# Patient Record
Sex: Male | Born: 1955 | Race: White | Hispanic: No | Marital: Single | Smoking: Current every day smoker
Health system: Southern US, Community
[De-identification: ages and names within clinical notes are randomized; demographics above are authoritative.]

## PROBLEM LIST (undated history)

## (undated) DIAGNOSIS — K219 Gastro-esophageal reflux disease without esophagitis: Secondary | ICD-10-CM

## (undated) DIAGNOSIS — R569 Unspecified convulsions: Secondary | ICD-10-CM

## (undated) DIAGNOSIS — C719 Malignant neoplasm of brain, unspecified: Secondary | ICD-10-CM

## (undated) DIAGNOSIS — F419 Anxiety disorder, unspecified: Principal | ICD-10-CM

## (undated) HISTORY — DX: Anxiety disorder, unspecified: F41.9

## (undated) HISTORY — PX: BRAIN TUMOR EXCISION: SHX577

## (undated) HISTORY — DX: Malignant neoplasm of brain, unspecified: C71.9

## (undated) HISTORY — PX: SALIVARY GLAND SURGERY: SHX768

## (undated) HISTORY — DX: Unspecified convulsions: R56.9

## (undated) HISTORY — DX: Gastro-esophageal reflux disease without esophagitis: K21.9

---

## 2008-11-25 ENCOUNTER — Emergency Department (HOSPITAL_COMMUNITY): Admission: EM | Admit: 2008-11-25 | Discharge: 2008-11-25 | Payer: Self-pay | Admitting: Emergency Medicine

## 2009-10-08 ENCOUNTER — Emergency Department (HOSPITAL_COMMUNITY): Admission: EM | Admit: 2009-10-08 | Discharge: 2009-10-08 | Payer: Self-pay | Admitting: Emergency Medicine

## 2010-06-15 ENCOUNTER — Emergency Department (HOSPITAL_COMMUNITY): Admission: EM | Admit: 2010-06-15 | Discharge: 2010-06-15 | Payer: Self-pay | Admitting: Emergency Medicine

## 2010-06-20 ENCOUNTER — Other Ambulatory Visit: Admission: RE | Admit: 2010-06-20 | Discharge: 2010-06-20 | Payer: Self-pay | Admitting: Otolaryngology

## 2011-03-16 LAB — BASIC METABOLIC PANEL
CO2: 22 mEq/L (ref 19–32)
Chloride: 102 mEq/L (ref 96–112)
Creatinine, Ser: 1.2 mg/dL (ref 0.4–1.5)
GFR calc non Af Amer: >60 mL/min (ref >60)
Glucose, Bld: 104 mg/dL — ABNORMAL HIGH (ref 70–99)
Potassium: 3.4 mEq/L — ABNORMAL LOW (ref 3.5–5.1)
Sodium: 132 mEq/L — ABNORMAL LOW (ref 135–145)

## 2011-03-16 LAB — DIFFERENTIAL
Basophils Relative: 1 % (ref 0–1)
Eosinophils Absolute: 0.5 10*3/uL (ref 0.0–0.7)
Lymphs Abs: 2.6 10*3/uL (ref 0.7–4.0)
Monocytes Absolute: 0.6 10*3/uL (ref 0.1–1.0)
Monocytes Relative: 5 % (ref 3–12)
Neutrophils Relative %: 71 % (ref 43–77)

## 2011-03-16 LAB — CBC
HCT: 41.1 % (ref 39.0–52.0)
Hemoglobin: 14.2 g/dL (ref 13.0–17.0)
MCV: 88.2 fL (ref 78.0–100.0)
Platelets: 262 10*3/uL (ref 150–400)
WBC: 13 10*3/uL — ABNORMAL HIGH (ref 4.0–10.5)

## 2012-09-07 ENCOUNTER — Encounter (HOSPITAL_COMMUNITY): Payer: Self-pay | Admitting: *Deleted

## 2012-09-07 ENCOUNTER — Emergency Department (HOSPITAL_COMMUNITY)
Admission: EM | Admit: 2012-09-07 | Discharge: 2012-09-07 | Disposition: A | Discharge status: Home / Self Care | Payer: Self-pay | Attending: Emergency Medicine | Admitting: Emergency Medicine

## 2012-09-07 DIAGNOSIS — F17200 Nicotine dependence, unspecified, uncomplicated: Secondary | ICD-10-CM | POA: Insufficient documentation

## 2012-09-07 DIAGNOSIS — L089 Local infection of the skin and subcutaneous tissue, unspecified: Secondary | ICD-10-CM | POA: Insufficient documentation

## 2012-09-07 MED ORDER — DOXYCYCLINE HYCLATE 100 MG PO TABS
ORAL_TABLET | ORAL | Status: AC
  Administered 2012-09-07: 100 mg via ORAL
  Filled 2012-09-07: qty 1

## 2012-09-07 MED ORDER — DOXYCYCLINE HYCLATE 100 MG PO CAPS: 100.0000 mg | ORAL_CAPSULE | Freq: Two times a day (BID) | ORAL | Status: AC

## 2012-09-07 MED ORDER — DOXYCYCLINE HYCLATE 100 MG PO TABS
100.0000 mg | ORAL_TABLET | Freq: Once | ORAL | Status: AC
  Administered 2012-09-07: 100 mg via ORAL

## 2012-09-07 MED ORDER — BACITRACIN ZINC 500 UNIT/GM EX OINT
TOPICAL_OINTMENT | CUTANEOUS | Status: AC
  Filled 2012-09-07: qty 0.9

## 2012-09-07 MED ORDER — HYDROCODONE-ACETAMINOPHEN 5-325 MG PO TABS: 1.0000 | ORAL_TABLET | Freq: Four times a day (QID) | ORAL | Status: AC | PRN

## 2012-09-07 MED ORDER — BACITRACIN ZINC 500 UNIT/GM EX OINT
TOPICAL_OINTMENT | Freq: Once | CUTANEOUS | Status: AC
  Administered 2012-09-07: 14:00:00 via TOPICAL

## 2012-09-07 NOTE — ED Notes (Signed)
Pt states that he thinks that he may have been bitten by a spider on Friday, at first there was a blister, blister busted and now he continues to have pain. Pt has open area on third toe and blister noted behind great toe on left foot.

## 2012-09-07 NOTE — ED Provider Notes (Signed)
History     CSN: [id number]  Arrival date & time 09/07/12  1229   First MD Initiated Contact with Patient 09/07/12 1315      Chief Complaint  Patient presents with  . Insect Bite    (Consider location/radiation/quality/duration/timing/severity/associated sxs/prior treatment) HPI Comments: Pt leaves a pair of shoes outside and thinks that when he slipped them on a couple days ago a spider or insect bit him on the L foot.  Has become increasingly painful and lesion began draining this AM.  The history is provided by the patient. No language interpreter was used.    History reviewed. No pertinent past medical history.  Past Surgical History  Procedure Date  . Salivary gland surgery     No family history on file.  History  Substance Use Topics  . Smoking status: Current Everyday Smoker  . Smokeless tobacco: Not on file  . Alcohol Use: No      Review of Systems  Constitutional: Negative for fever and chills.  Skin: Positive for wound.  All other systems reviewed and are negative.    Allergies  Review of patient's allergies indicates no known allergies.  Home Medications   Current Outpatient Rx  Name Route Sig Dispense Refill  . IBUPROFEN 200 MG PO TABS Oral Take 400 mg by mouth every 6 (six) hours as needed. Pain    . ADULT MULTIVITAMIN W/MINERALS CH Oral Take 1 tablet by mouth daily.    Marland Kitchen DOXYCYCLINE HYCLATE 100 MG PO CAPS Oral Take 1 capsule (100 mg total) by mouth 2 (two) times daily. 20 capsule 0  . HYDROCODONE-ACETAMINOPHEN 5-325 MG PO TABS Oral Take 1 tablet by mouth every 6 (six) hours as needed for pain. 20 tablet 0    BP 146/76  Pulse 87  Temp 98 F (36.7 C) (Oral)  Resp 20  SpO2 98%  Physical Exam  Nursing note and vitals reviewed. Constitutional: He is oriented to person, place, and time. He appears well-developed and well-nourished.  HENT:  Head: Normocephalic and atraumatic.  Eyes: EOM are normal.  Neck: Normal range of motion.    Cardiovascular: Normal rate, regular rhythm, normal heart sounds and intact distal pulses.   Pulmonary/Chest: Effort normal and breath sounds normal. No respiratory distress.  Abdominal: Soft. He exhibits no distension. There is no tenderness.  Musculoskeletal:       Left foot: He exhibits tenderness. He exhibits no bony tenderness, no swelling and normal capillary refill.       1 cm diam pustule draining at plantar junction of L 1st and 2nd toes.  Abrasion like sore on medial side of 4th toe.  Neurological: He is alert and oriented to person, place, and time.  Skin: Skin is warm and dry.  Psychiatric: He has a normal mood and affect. Judgment normal.    ED Course  Procedures (including critical care time)  Labs Reviewed - No data to display No results found.   1. Pustule       MDM  rx-doxycycline, 20 rx-hydrocodone, 20 Ibuprofen Warm soaks.   Return prn        Evalina Field, [place] 09/07/12 1421

## 2012-09-07 NOTE — ED Notes (Signed)
Pt with small blister noted on Friday per pt, blister bigger between left great toe and second toe, noted with drainage, also second blister between second and third toe of same foot, pt thinks he may have gotten bit by a spider

## 2012-09-10 NOTE — ED Provider Notes (Signed)
Medical screening examination/treatment/procedure(s) were performed by non-physician practitioner and as supervising physician I was immediately available for consultation/collaboration.  Donnetta Hutching, MD 09/10/12 [phone]

## 2013-10-21 ENCOUNTER — Other Ambulatory Visit (HOSPITAL_COMMUNITY): Payer: Self-pay | Admitting: Family Medicine

## 2013-10-21 ENCOUNTER — Ambulatory Visit (HOSPITAL_COMMUNITY)
Admission: RE | Admit: 2013-10-21 | Discharge: 2013-10-21 | Disposition: A | Discharge status: Home / Self Care | Payer: 59 | Source: Ambulatory Visit | Attending: Family Medicine | Admitting: Family Medicine

## 2013-10-21 DIAGNOSIS — F17200 Nicotine dependence, unspecified, uncomplicated: Secondary | ICD-10-CM | POA: Insufficient documentation

## 2013-10-21 DIAGNOSIS — R918 Other nonspecific abnormal finding of lung field: Secondary | ICD-10-CM | POA: Insufficient documentation

## 2014-04-13 DIAGNOSIS — J449 Chronic obstructive pulmonary disease, unspecified: Secondary | ICD-10-CM | POA: Insufficient documentation

## 2014-04-13 DIAGNOSIS — C719 Malignant neoplasm of brain, unspecified: Secondary | ICD-10-CM | POA: Insufficient documentation

## 2014-04-13 DIAGNOSIS — G40909 Epilepsy, unspecified, not intractable, without status epilepticus: Secondary | ICD-10-CM | POA: Insufficient documentation

## 2014-04-13 MED ORDER — TEMOZOLOMIDE 20 MG PO CAPS: 20.0000 mg | ORAL_CAPSULE | Freq: Every day | ORAL | Status: DC

## 2014-04-13 MED ORDER — TEMOZOLOMIDE 140 MG PO CAPS: 140.0000 mg | ORAL_CAPSULE | Freq: Every day | ORAL | Status: DC

## 2014-04-14 ENCOUNTER — Other Ambulatory Visit (HOSPITAL_COMMUNITY): Payer: Self-pay | Admitting: Hematology and Oncology

## 2014-04-14 DIAGNOSIS — C719 Malignant neoplasm of brain, unspecified: Secondary | ICD-10-CM

## 2014-04-19 ENCOUNTER — Ambulatory Visit (HOSPITAL_COMMUNITY)
Admission: RE | Admit: 2014-04-19 | Discharge: 2014-04-19 | Disposition: A | Discharge status: Home / Self Care | Payer: 59 | Source: Ambulatory Visit | Attending: Hematology and Oncology | Admitting: Hematology and Oncology

## 2014-04-19 ENCOUNTER — Telehealth (HOSPITAL_COMMUNITY): Payer: Self-pay | Admitting: *Deleted

## 2014-04-19 DIAGNOSIS — C719 Malignant neoplasm of brain, unspecified: Secondary | ICD-10-CM | POA: Insufficient documentation

## 2014-04-19 DIAGNOSIS — I6789 Other cerebrovascular disease: Secondary | ICD-10-CM | POA: Insufficient documentation

## 2014-04-19 DIAGNOSIS — Z9889 Other specified postprocedural states: Secondary | ICD-10-CM | POA: Insufficient documentation

## 2014-04-19 MED ORDER — GADOBENATE DIMEGLUMINE 529 MG/ML IV SOLN
20.0000 mL | Freq: Once | INTRAVENOUS | Status: AC | PRN
  Administered 2014-04-19: 20 mL via INTRAVENOUS

## 2014-04-21 ENCOUNTER — Encounter (HOSPITAL_COMMUNITY): Payer: 59 | Attending: Hematology and Oncology

## 2014-04-21 ENCOUNTER — Encounter (HOSPITAL_COMMUNITY): Payer: Self-pay

## 2014-04-21 VITALS — BP 131/79 | HR 100 | Temp 97.3°F | Resp 18 | Ht 69.0 in | Wt 214.0 lb

## 2014-04-21 DIAGNOSIS — K219 Gastro-esophageal reflux disease without esophagitis: Secondary | ICD-10-CM | POA: Insufficient documentation

## 2014-04-21 DIAGNOSIS — C711 Malignant neoplasm of frontal lobe: Secondary | ICD-10-CM | POA: Insufficient documentation

## 2014-04-21 DIAGNOSIS — C719 Malignant neoplasm of brain, unspecified: Secondary | ICD-10-CM

## 2014-04-21 DIAGNOSIS — G40909 Epilepsy, unspecified, not intractable, without status epilepticus: Secondary | ICD-10-CM | POA: Insufficient documentation

## 2014-04-21 DIAGNOSIS — J4489 Other specified chronic obstructive pulmonary disease: Secondary | ICD-10-CM | POA: Insufficient documentation

## 2014-04-21 DIAGNOSIS — J309 Allergic rhinitis, unspecified: Secondary | ICD-10-CM | POA: Insufficient documentation

## 2014-04-21 DIAGNOSIS — J449 Chronic obstructive pulmonary disease, unspecified: Secondary | ICD-10-CM

## 2014-04-21 LAB — CBC WITH DIFFERENTIAL/PLATELET
BASOS PCT: 0 % (ref 0–1)
Basophils Absolute: 0 10*3/uL (ref 0.0–0.1)
EOS PCT: 2 % (ref 0–5)
Eosinophils Absolute: 0.2 10*3/uL (ref 0.0–0.7)
HEMATOCRIT: 38.2 % — AB (ref 39.0–52.0)
HEMOGLOBIN: 13.4 g/dL (ref 13.0–17.0)
LYMPHS PCT: 25 % (ref 12–46)
Lymphs Abs: 3 10*3/uL (ref 0.7–4.0)
MCH: 30.1 pg (ref 26.0–34.0)
MCHC: 35.1 g/dL (ref 30.0–36.0)
MCV: 85.8 fL (ref 78.0–100.0)
MONO ABS: 0.9 10*3/uL (ref 0.1–1.0)
MONOS PCT: 8 % (ref 3–12)
NEUTROS ABS: 7.8 10*3/uL — AB (ref 1.7–7.7)
Neutrophils Relative %: 65 % (ref 43–77)
Platelets: 338 10*3/uL (ref 150–400)
RBC: 4.45 MIL/uL (ref 4.22–5.81)
RDW: 14 % (ref 11.5–15.5)
WBC: 11.8 10*3/uL — AB (ref 4.0–10.5)

## 2014-04-21 LAB — COMPREHENSIVE METABOLIC PANEL
ALT: 27 U/L (ref 0–53)
AST: 20 U/L (ref 0–37)
Albumin: 3.4 g/dL — ABNORMAL LOW (ref 3.5–5.2)
Alkaline Phosphatase: 88 U/L (ref 39–117)
BUN: 19 mg/dL (ref 6–23)
CALCIUM: 9.8 mg/dL (ref 8.4–10.5)
CHLORIDE: 97 meq/L (ref 96–112)
CO2: 23 meq/L (ref 19–32)
Creatinine, Ser: 1.16 mg/dL (ref 0.50–1.35)
GFR calc non Af Amer: 68 mL/min — ABNORMAL LOW (ref >90)
GFR, EST AFRICAN AMERICAN: 79 mL/min — AB (ref >90)
Glucose, Bld: 93 mg/dL (ref 70–99)
POTASSIUM: 3.5 meq/L — AB (ref 3.7–5.3)
Sodium: 135 mEq/L — ABNORMAL LOW (ref 137–147)
TOTAL PROTEIN: 7.2 g/dL (ref 6.0–8.3)
Total Bilirubin: 0.2 mg/dL — ABNORMAL LOW (ref 0.3–1.2)

## 2014-04-21 LAB — LACTATE DEHYDROGENASE: LDH: 217 U/L (ref 94–250)

## 2014-04-21 MED ORDER — OXYCODONE HCL 5 MG PO CAPS: ORAL_CAPSULE | ORAL | Status: DC

## 2014-04-21 MED ORDER — ONDANSETRON 8 MG PO TBDP: ORAL_TABLET | ORAL | Status: DC

## 2014-04-21 NOTE — Progress Notes (Signed)
Cody Buchanan presented for labwork. Labs per MD order drawn via Peripheral Line 23 gauge needle inserted in left AC  Good blood return present. Procedure without incident.  Needle removed intact. Patient tolerated procedure well.   

## 2014-04-21 NOTE — Progress Notes (Signed)
[place] A. Barnet Glasgow, M.D.  NEW PATIENT EVALUATION   Name: Cody Buchanan Date: 04/21/2014 MRN: [id number] DOB: 1956/09/23  PCP: [name], MD   REFERRING PHYSICIAN: [name], MD  REASON FOR REFERRAL: Glioblastoma multiforme, status post surgical resection     HISTORY OF PRESENT ILLNESS:Cody Buchanan is a 58 y.o. 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 Apparently he has been enrolled in the REGULATEe trial at [facility] and is here today to establish himself for combined modality therapy given closer to his home. He presented on 02/17/2014 with a seizure occurring at 4:30 AM while on a trip accompanied by a colleague in a hotel near [place]. He had  sutures removed this morning by family physician. He's had occasional headache but no double vision and slightly blurred vision. He has noticed some decrease in short-term memory. He denies any focal weakness. He's had no seizure activity. Appetite is good with no nausea, vomiting, diarrhea, constipation, melena, hematochezia, hematuria, or incontinence. He denies any lower extremity swelling or redness, PND, orthopnea, palpitations, or worsening cough or shortness of breath.   PAST MEDICAL HISTORY:  has a past medical history of Glioblastoma; GERD (gastroesophageal reflux disease); and Seizures.   [facility] Office Note of 04/12/2014: Cody Buchanan presents to clinic today to discuss treatment options for his Glioblastoma. He had an excellent gross total resection and is ready to proceed with additional treatment. We discussed treatment options with him today and he would like to move forward with enrolling in the REGULATory T-Cell Inhibition  with Basiliximab (Simulect) during Recovery from Therapeutic Temozolomide-induced Lymphopenia during Anti-tumor Immunotherapy Targeted Against Cytomegalovirus in Patients with Newly Diagnosed Glioblastoma Multiforme (REGULATEe) clinical trial. The study design includes the following:   Leukapheresis collection with portion of cells, along with addition GM-CSF used to manufacture DC CMV pp65 Vaccine  Standard of Care Radiation with Temolozomide for 6 weeks, with Post-Radiation Temozolomide  Vaccines administered on Day 21 of cycle, every 2 weeks, for the first 6 weeks of study (for the first 3 Vaccines)  Basiliximab will be administered one week before vaccine dose one and two  Vaccines continue with monthly cycles of Temolozomide for a total of 8 Vaccines, and total 12 cycles of Temolozomide (until intolerance or tumor progression)    PAST SURGICAL HISTORY: Past Surgical History  Procedure Laterality Date  . Salivary gland surgery    . Brain tumor excision       CURRENT MEDICATIONS: has a current medication list which includes the following prescription(s): acetaminophen, albuterol, clindamycin, ibuprofen, levetiracetam, multivitamin with minerals, oxycodone, tiotropium, ondansetron, ondansetron, temozolomide, and temozolomide.   ALLERGIES: Review of patient's allergies indicates no known allergies.   SOCIAL HISTORY:  reports that he has been smoking Cigarettes.  He has been smoking about 0.00 packs per day. He does not have any smokeless tobacco history on file. He reports that he does not drink alcohol or use illicit drugs.   FAMILY HISTORY: family history includes Cancer in his mother; Diabetes in his father and mother; Hypertension in his mother; Stroke in his mother.    REVIEW OF SYSTEMS:  Other than that discussed above is noncontributory.    PHYSICAL EXAM:  height is _0  (1.753 m) and weight  is 214 lb (97.07 kg). His oral temperature is 97.3 F (36.3 C). His blood  pressure is 131/79 and his pulse is 100. His respiration is 18.    GENERAL:alert, no distress and comfortable. Slightly cushingoid with a  bull neck. SKIN: skin color, texture, turgor are normal, no rashes or significant lesions. Frontal scalp V-shaped incision is healing well extending along the left lateral skull. EYES: normal, Conjunctiva are pink and non-injected, sclera clear OROPHARYNX:no exudate, no erythema and lips, buccal mucosa, and tongue normal  NECK: supple, thyroid normal size, non-tender, without nodularity CHEST: Increased AP diameter with no gynecomastia. LYMPH:  no palpable lymphadenopathy in the cervical, axillary or inguinal LUNGS: clear to auscultation and percussion with normal breathing effort HEART: regular rate & rhythm and no murmurs ABDOMEN:abdomen soft, non-tender and normal bowel sounds MUSCULOSKELETALl:no cyanosis of digits, no clubbing or edema  NEURO: alert & oriented x 3 with fluent speech, no focal motor/sensory deficits    LABORATORY DATA:    04/06/2014: WBC 10.3 hemoglobin 13.2 platelets 279,000.  No visits with results within 30 Day(s) from this visit. Latest known visit with results is:  Hospital Outpatient Visit on 06/15/2010  Component Date Value Ref Range Status  . Sodium 06/15/2010 132* 135 - 145 mEq/L Final  . Potassium 06/15/2010 3.4* 3.5 - 5.1 mEq/L Final  . Chloride 06/15/2010 102  96 - 112 mEq/L Final  . CO2 06/15/2010 22  19 - 32 mEq/L Final  . Glucose, Bld 06/15/2010 104* 70 - 99 mg/dL Final  . BUN 06/15/2010 20  6 - 23 mg/dL Final  . Creatinine, Ser 06/15/2010 1.20  0.4 - 1.5 mg/dL Final  . Calcium 06/15/2010 8.6  8.4 - 10.5 mg/dL Final  . GFR calc non Af Amer 06/15/2010 >60  >60 mL/min Final  . GFR calc Af Amer 06/15/2010   >60 mL/min Final                   Value:>60                                The eGFR has been calculated                         using the MDRD equation.                         This calculation has not been                           validated in all clinical                         situations.                         eGFR's persistently                         <60 mL/min signify                         possible Chronic Kidney Disease.  . WBC 06/15/2010 13.0* 4.0 - 10.5 K/uL Final  . RBC 06/15/2010 4.66  4.22 - 5.81 MIL/uL Final  . Hemoglobin 06/15/2010 14.2  13.0 - 17.0 g/dL Final  .  HCT 06/15/2010 41.1  39.0 - 52.0 % Final  . MCV 06/15/2010 88.2  78.0 - 100.0 fL Final  . MCHC 06/15/2010 34.5  30.0 - 36.0 g/dL Final  . RDW 06/15/2010 12.7  11.5 - 15.5 % Final  . Platelets 06/15/2010 262  150 - 400 K/uL Final  . Neutrophils Relative % 06/15/2010 71  43 - 77 % Final  . Neutro Abs 06/15/2010 9.2* 1.7 - 7.7 K/uL Final  . Lymphocytes Relative 06/15/2010 20  12 - 46 % Final  . Lymphs Abs 06/15/2010 2.6  0.7 - 4.0 K/uL Final  . Monocytes Relative 06/15/2010 5  3 - 12 % Final  . Monocytes Absolute 06/15/2010 0.6  0.1 - 1.0 K/uL Final  . Eosinophils Relative 06/15/2010 4  0 - 5 % Final  . Eosinophils Absolute 06/15/2010 0.5  0.0 - 0.7 K/uL Final  . Basophils Relative 06/15/2010 1  0 - 1 % Final  . Basophils Absolute 06/15/2010 0.1  0.0 - 0.1 K/uL Final    Urinalysis No results found for this basename: colorurine,  appearanceur,  labspec,  phurine,  glucoseu,  hgbur,  bilirubinur,  ketonesur,  proteinur,  urobilinogen,  nitrite,  leukocytesur      _0 : Mr Kizzie Fantasia Contrast  04/19/2014   CLINICAL DATA:  Glioblastoma status post resection in 03/03/2014 and 04/06/2014. No chemoradiation yet received.  EXAM: MRI HEAD WITHOUT AND WITH CONTRAST  TECHNIQUE: Multiplanar, multiecho pulse sequences of the brain and surrounding structures were obtained without and with intravenous contrast.  CONTRAST:  23m MULTIHANCE GADOBENATE DIMEGLUMINE 529 MG/ML IV SOLN  COMPARISON:  None.  FINDINGS: Sequelae of left frontal craniotomy and tumor resection are identified. Foci of susceptibility artifact in the  anteromedial left frontal lobe are compatible with a small amount of blood products related to surgery. There is a small amount of extra-axial fluid at this location, also compatible with recent surgery. Small areas of intrinsic T1 hyperintensity in this region also likely reflect blood products. There is relatively smooth enhancement along the margins of the left frontal resection cavity measuring approximately 3 mm in thickness. No masslike enhancement is seen, although there is a small amount of somewhat ill defined enhancement extending slightly beyond the margins of the resection cavity laterally (series 13, image 15 and series 12, image 26). There is cortical and subcortical nonenhancing T2 hyperintensity in the medial left frontal lobe adjacent to the resection site which extends posteriorly into the region of the left cingulate gyrus and left genu of the corpus callosum. Some of this T2 signal abnormality demonstrates mildly restricted diffusion.  No abnormal enhancement is identified elsewhere in the brain. There are multiple small, scattered foci of T2 hyperintensity within the subcortical and deep cerebral white matter bilaterally and pons, nonspecific but compatible with mild chronic small vessel ischemic disease. There is mild generalized cerebral atrophy. There is no midline shift. Orbits are unremarkable. Mild bilateral ethmoid air cell, bilateral maxillary sinus, and left sphenoid sinus mucosal thickening is noted. Mastoid air cells are clear. Major intracranial vascular flow voids are preserved.  IMPRESSION: 1. Postoperative changes from left frontal resection. Enhancement at the margins of the resection site is likely postoperative. T2 signal abnormality extending posteriorly from the resection site may reflect residual nonenhancing tumor. 2. Mild chronic small vessel ischemic disease.   Electronically Signed   By: ALogan Bores  On: 04/19/2014 11:33    PATHOLOGY:  A. "BRAIN, LEFT FRONTAL"  OUTSIDE SLIDE REVIEW, [phone]  [place]. 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  I certify that I personally conducted the diagnostic evaluation of the above specimen(s) and have rendered the above diagnosis(es).   Electronically signed by Jeris Penta, MD on 03/24/2014 at 51  Clinical Information  58 y/o male presented with new onset of seizures. CT showed a 2 cm left frontal lobe mass.   Gross Examination  Outside case A: [id number] Date of surgery: 03/03/14 Number of slides: 6 [place] report signed by: Dr. Isac Caddy  Received from:  [facility] Department of Pathology [facility] [address] [place], [place] [address] Tel: [phone] Fax: [phone]  Accompanying letter addressed to Dr. Tommi Rumps. Material to be returned.   Microscopic Examination  Microscopic examination is performed. 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   MOLECULAR BIOLOGY:  IDH1 Targeted Mutation Analysis with Reflex to [facility] Mutation Analysis4/09/2014  [facility]  Component Name Value Range  Case Report Molecular Diagnostics                             Case: [id number]                                Authorizing Provider:  Baruch Goldmann, MD  Collected:           04/07/2014 [place]             Pathologist:           Constance Haw, MD   Received:            04/12/2014 1706             Specimen:    Tissue                                                                                  Interpretation Unstained slides, [id number] (IDH1 Targeted Mutation Analysis with Reflex to [facility] Mutation Analysis):  Negative.   IDH1 and IDH2 mutations not detected.  See comment and objective findings.   Comment:  Isocitrate dehydrogenase 1 and 2 (IDH1 and IDH2) are NADP+ dependent enzymes that catalyze the  conversion of isocitrate to alpha-ketoglutarate and are key components in the mitochondrial citric acid cycle. Acquired point mutations in primary CNS neoplasms have been described in codon 132 of IDH1 (predominantly Arg132His, but also Arg132Cys, Arg132Ser, Arg132Leu and Arg132Gly) and the analogous amino acid in IDH2 (Arg172Gly, Arg172Lys and Arg172Met). A high percentage of WHO grade II and grade III astrocytic and oligodendroglial neoplasms contain IDH1 or IDH2 mutations, including diffuse astrocytomas (II), oligodendrogliomas (II), anaplastic astrocytomas and oligodendrogliomas (III) and anaplastic oligoastrocytomas (III). IDH1 and IDH2 mutations are also common in secondary glioblastomas (IV) but are rarely found in primary adult or pediatric glioblastomas (IV). In patients with glioblastomas or anaplastic astrocytomas, the presence of an acquired IDH1 or IDH2 mutation is associated with longer overall survival. Multiple factors contribute to prognosis in patients with primary CNS neoplasms. Thus, this assay is intended for use as  an aid in developing patient-specific prognostic predictions but is not a substitute for a complete pathologic and clinical evaluation, or physician's judgment and clinical experience.  The sensitivity and specificity of DNA sequencing is high for the detection of nucleotide base changes, small deletions, and insertions in the regions analyzed. This assay may not detect an acquired mutation that is present below the 15% detection limit (i.e., mutant cell population of <30%). Only amino acids 69-138 of the IDH1 gene and amino acids 126-178 of the IDH2 gene were examined. Changes outside of this region will not be detected. The presence of a mutant population containing a large deletion, duplication, insertion, aberrant splicing, or sequence alteration adversely affecting primer binding may not be identified using these methods. Mutations or polymorphisms in the DNA oligonucleotide  primer binding regions, poor DNA quality, insufficient DNA quantity or the presence of PCR inhibitors can result in uninterpretable or (rarely) inaccurate results. For additional information or for help interpreting the results of this test, clinicians should contact the [facility]. Patients should contact their healthcare provider with any questions related to this report.  References:  Balss J, et al. Analysis of the [facility] codon 132 mutation in brain tumors. Acta Neuropathol. 7096;283:662-947.   Mady Haagensen, et al. [facility] and IDH2 mutations in gliomas. N Engl J Med. 2009;360(8):765-73.  Laboratory Director:                     Marcelina Morel, Ph.D., ABMG; [facility]                      Associate Director, Molecular Diagnostics   Clinical History Glioblastoma (WHO Grade IV)   Sample Type Unstained slides, [id number]   Test Performed [facility] Targeted Mutation Analysis with Reflex to [facility] Mutation Analysis   Objective Findings Complete coverage of IDH1 exon 4 (amino acids 69-138) was obtained using forward and reverse sequencing primers. These sequences were compared to the reference DNA sequence (GenBank Accession: [id number]). Complete coverage of IDH2 exon 4 (amino acids 126-178) was obtained using forward and reverse sequencing primers. These sequences were compared to the reference DNA sequence (GenBank Accession: [id number]).  No mutation was detected.   Methodology This assay uses PCR amplification followed by Sanger DNA sequencing to detect point mutations in exon 4 of the IDH1 gene, with reflex testing to detect point mutations in exon 4 of the IDH2 gene for all IDH1 negative cases. An H&E stained slide for each case is first evaluated to identify the regions of greatest tumor content. These regions are then macro-dissected from adjacent unstained formalin-fixed paraffin-embedded sections and used to prepare genomic DNA. The protein coding and flanking intronic  sequences of IDH1 exon 4 (containing codon 132), and, if reflex testing is performed, IDH2 exon 4 (containing codon 172) are amplified from this purified genomic DNA by PCR. The primers used in these PCR reactions contain M13 universal primer "tails" at their 5' ends, and have 3' ends that are complementary to their genomic target sequence. The resulting PCR products are treated with an exonuclease/phosphatase mixture (ExoSAP-IT) to remove excess PCR primers and nucleotides. These  purified DNA amplicons are then sequenced using universal M13 forward and reverse sequencing primers (M13 Forward/-20 and M13 Reverse/-27) and the [employer] D[employer] Terminator v3.1 Cycle Sequencing Kit [employer]). The products of the completed sequencing reactions are purified with the Big Dye XTerminator Purification Kit and resolved using the ABI Genetic Analyzer. Data is analyzed using the ABI  Data [employer], Librarian, academic, and [employer]. Sequences are compared to the reference DNA sequence for the [facility] and IDH2 genes (GenBank Accession IDH1: [id number]; IDH2: [id number]).   Disclaimer This result contains rich text formatting which cannot be displayed here.      IMPRESSION:  #1. Glioblastoma multiforme left frontal lobe, status post resection with MRI evidence of residual disease. #2. Enrolled in the REGULATEe trial which involves leukapheresis prior to the initiation of combined modality postop therapy with Temodar plus Radiation. #3. Chronic obstructive pulmonary disease, stable. #4. Seizure disorder, controlled. #5. Allergic rhinitis, not symptomatic.   PLAN:  #1. Radiotherapy consultation at [facility] in [place],  [place] on 04/24/2014. #2. Combined modality therapy may not begin until leukapheresis is performed on 05/08/2014 at [facility] according to [facility] protocol. #3. Followup in this office in one month. Rx given for Zofran ODT 8 mg every 8 hours along  with oxycodone as needed. Temodar will be delivered via specialty pharmacy by mail with the patient taking 160 mg per day for 42 consecutive days beginning on day 1 of radiotherapy.   Farrel Gobble, MD 04/21/2014 3:45 PM   DISCLAIMER:  This note was dictated with voice recognition softwre.  Similar sounding words can inadvertently be transcribed inaccurately and may not be corrected upon review.

## 2014-04-21 NOTE — Patient Instructions (Signed)
[facility] Discharge Instructions  RECOMMENDATIONS MADE BY THE CONSULTANT AND ANY TEST RESULTS WILL BE SENT TO YOUR REFERRING PHYSICIAN.  EXAM FINDINGS BY THE PHYSICIAN TODAY AND SIGNS OR SYMPTOMS TO REPORT TO CLINIC OR PRIMARY PHYSICIAN: Exam and findings as discussed by Dr. Barnet Glasgow.  MEDICATIONS PRESCRIBED:   Oxycodone; Zofran; Temodar  INSTRUCTIONS/FOLLOW-UP:  Appointment with radiation oncologist Monday, April 27th as scheduled.  It appears that any treatment you may receives should be held until your leukophoresis is complete for the clinical trial you are involved with at [facility] Please return here in 4 weeks for a follow-up visit with the doctor and blood work (CBC).   Thank you for choosing [facility] to provide your oncology and hematology care.  To afford each patient quality time with our providers, please arrive at least 15 minutes before your scheduled appointment time.  With your help, our goal is to use those 15 minutes to complete the necessary work-up to ensure our physicians have the information they need to help with your evaluation and healthcare recommendations.    Effective January 1st, 2014, we ask that you re-schedule your appointment with our physicians should you arrive 10 or more minutes late for your appointment.  We strive to give you quality time with our providers, and arriving late affects you and other patients whose appointments are after yours.    Again, thank you for choosing [facility].  Our hope is that these requests will decrease the amount of time that you wait before being seen by our physicians.       _____________________________________________________________  Should you have questions after your visit to [facility], please contact our office at [phone] between the hours of 8:30 a.m. and 5:00 p.m.  Voicemails left after 4:30 p.m. will not be returned until the following business  day.  For prescription refill requests, have your pharmacy contact our office with your prescription refill request.

## 2014-05-05 ENCOUNTER — Telehealth (HOSPITAL_COMMUNITY): Payer: Self-pay

## 2014-05-05 NOTE — Telephone Encounter (Signed)
Message copied by Mellissa Kohut on Fri May 05, 2014  3:37 PM ------      Message from: Baird Cancer      Created: Fri May 05, 2014  1:20 PM       Nurses: please advise patient of this information            ----- Message -----         From: Farrel Gobble, MD         Sent: 05/05/2014  12:12 PM           To: Baird Cancer, PA-C            Tell him to take the Temodar at bedtime nightly at the same time every day for 42 days in a row. So-called metronomic therapy.      ----- Message -----         From: Baird Cancer, PA-C         Sent: 05/05/2014  11:52 AM           To: Farrel Gobble, MD, Mellissa Kohut, RN, #            Dr. Isidore Moos (Rad Onc) called.  She wants to verify the timing of the Temadar in relation to radiation.  I read your note and you said start Day 1 of radiation and take daily x 42 days.  Should he take the pills before or after radiation, in the AM or PM, or is there no need to worry about timing as long as he takes it the same time every day?            Once you let me know, I will advise the nurses to contact the patient regarding this information.  The patient was inquiring about the timing and Dr. Isidore Moos reports that he seems to be a patient who pays attention to details.            Thanks            Baird Cancer                   ------

## 2014-05-05 NOTE — Telephone Encounter (Signed)
Per Dr. Barnet Glasgow, patient is to start Temodar the night of radiation and is to take it the same time every day for 42 days in a row.  Verbalized understanding of instructions.

## 2014-05-15 ENCOUNTER — Telehealth (HOSPITAL_COMMUNITY): Payer: Self-pay

## 2014-05-15 ENCOUNTER — Other Ambulatory Visit (HOSPITAL_COMMUNITY): Payer: Self-pay | Admitting: Hematology and Oncology

## 2014-05-15 MED ORDER — METHYLPREDNISOLONE (PAK) 4 MG PO TABS: ORAL_TABLET | ORAL | Status: DC

## 2014-05-15 MED ORDER — OXYCODONE HCL 5 MG PO CAPS: ORAL_CAPSULE | ORAL | Status: DC

## 2014-05-15 NOTE — Telephone Encounter (Signed)
Call from patient with complaints of constipation - Had BM on Thursday, none on Friday and Saturday.  Sunday took 2 Dulcolax, drank apple juice and began having BMs almost diarrhea today.  Also, has swelling and pain in joint of left great toe.  Area is very tender, red and swelling makes it difficult to walk without pain.  States that he has had gout in the past and thinks that's what it is.  Began radiation last week and has had increased pain in head at surgical site so is taking more pain medication and only has about 4 pills left.  Wants to know what to do about constipation, gouty symptoms, and if can get another prescription for pain medication.  Rates discomfort at 6 - 7 most of the time.  Questions if med strength can be increased?

## 2014-05-15 NOTE — Telephone Encounter (Signed)
Message copied by Mellissa Kohut on Mon May 15, 2014 10:21 AM ------      Message from: Farrel Gobble A      Created: Mon May 15, 2014  9:55 AM       Will order Medrol Dospak for gout.  Take 2 Senokot-S (generic) daily after loose stools normalize. Rx for pain written (will have to pickup here).  [facility] He goes for RT daily!!! You'd think they could help him! ------

## 2014-05-15 NOTE — Telephone Encounter (Signed)
Patient notified and will pick up prescription for pain medication after completion of radiation today.

## 2014-05-18 ENCOUNTER — Encounter (HOSPITAL_COMMUNITY): Payer: Self-pay

## 2014-05-18 ENCOUNTER — Encounter (HOSPITAL_BASED_OUTPATIENT_CLINIC_OR_DEPARTMENT_OTHER): Payer: 59

## 2014-05-18 ENCOUNTER — Encounter (HOSPITAL_COMMUNITY): Payer: 59 | Attending: Hematology and Oncology

## 2014-05-18 VITALS — BP 137/88 | HR 86 | Temp 97.9°F | Resp 20 | Wt 213.6 lb

## 2014-05-18 DIAGNOSIS — M109 Gout, unspecified: Secondary | ICD-10-CM

## 2014-05-18 DIAGNOSIS — C719 Malignant neoplasm of brain, unspecified: Secondary | ICD-10-CM

## 2014-05-18 DIAGNOSIS — G40802 Other epilepsy, not intractable, without status epilepticus: Secondary | ICD-10-CM

## 2014-05-18 DIAGNOSIS — F411 Generalized anxiety disorder: Secondary | ICD-10-CM

## 2014-05-18 DIAGNOSIS — J449 Chronic obstructive pulmonary disease, unspecified: Secondary | ICD-10-CM

## 2014-05-18 DIAGNOSIS — C711 Malignant neoplasm of frontal lobe: Secondary | ICD-10-CM

## 2014-05-18 LAB — CBC WITH DIFFERENTIAL/PLATELET
BASOS PCT: 0 % (ref 0–1)
Basophils Absolute: 0 10*3/uL (ref 0.0–0.1)
Eosinophils Absolute: 0 10*3/uL (ref 0.0–0.7)
Eosinophils Relative: 0 % (ref 0–5)
HEMATOCRIT: 39.3 % (ref 39.0–52.0)
Hemoglobin: 13.1 g/dL (ref 13.0–17.0)
Lymphocytes Relative: 21 % (ref 12–46)
Lymphs Abs: 2.8 10*3/uL (ref 0.7–4.0)
MCH: 28.5 pg (ref 26.0–34.0)
MCHC: 33.3 g/dL (ref 30.0–36.0)
MCV: 85.6 fL (ref 78.0–100.0)
Monocytes Absolute: 0.7 10*3/uL (ref 0.1–1.0)
Monocytes Relative: 6 % (ref 3–12)
NEUTROS PCT: 73 % (ref 43–77)
Neutro Abs: 9.4 10*3/uL — ABNORMAL HIGH (ref 1.7–7.7)
PLATELETS: 309 10*3/uL (ref 150–400)
RBC: 4.59 MIL/uL (ref 4.22–5.81)
RDW: 13.6 % (ref 11.5–15.5)
WBC: 12.9 10*3/uL — ABNORMAL HIGH (ref 4.0–10.5)

## 2014-05-18 MED ORDER — TAMSULOSIN HCL 0.4 MG PO CAPS: ORAL_CAPSULE | ORAL | Status: DC

## 2014-05-18 MED ORDER — DIAZEPAM 10 MG PO TABS: ORAL_TABLET | ORAL | Status: DC

## 2014-05-18 NOTE — Progress Notes (Signed)
[facility]  OFFICE PROGRESS NOTE  Cody Hampshire, MD [place] [address]  DIAGNOSIS: Malignant brain tumor, glioblastoma multiforme - Plan: Comprehensive metabolic panel, CBC with Differential  Chief Complaint  Patient presents with  . Glioblastoma multiforme    CURRENT THERAPY: Daily radiotherapy to the brain 5 days a week plus Temodar 160 mg daily as 140 mg and 20 mg tablets for a total of 42 consecutive days utilizing Zofran as an antinauseant. daily.  INTERVAL HISTORY: Cody Buchanan 58 y.o. male returns for followup of glioblastoma multiforme a, status post resection with MRI showing evidence of residual disease, currently enrolled in REGULATEe Trial at [facility]. He did undergo leukapheresis on 05/08/2014 to obtain lymphocytes which would be expanded and reinfused as part of the protocol after completion of combined modality treatment. He developed swelling and tenderness of the left great toe and was treated successfully for acute gouty arthritis. His urinary frequency about 20 times per day since beginning treatment along with constipation. He is quite anxious. He denies any headache, focal weakness, melena, hematochezia, hematuria, epistaxis, or hemoptysis. He wishes to stop smoking. He denies any PND, orthopnea, palpitations, sore throat, or seizure activity.   MEDICAL HISTORY: Past Medical History  Diagnosis Date  . Glioblastoma   . GERD (gastroesophageal reflux disease)   . Seizures     INTERIM HISTORY: has Malignant brain tumor, left frontal glioblastoma multiforme grade 4; Chronic obstructive pulmonary disease; Seizure disorder; and Allergic rhinitis on his problem list.   Left frontal glioblastoma multiforme, initially resected at [place], [place] in March of 2015 with additional surgery at [facility] in April of 2015. He has been enrolled in the REGULATEe trial at [facility] and establish  himself locally for combined modality therapy given closer to his home. He presented on 02/17/2014 with a seizure occurring at 4:30 AM while on a trip accompanied by a colleague in a hotel near [place].  [facility] Office Note of 04/12/2014:  Cody Buchanan presents to clinic today to discuss treatment options for his Glioblastoma. He had an excellent gross total resection and is ready to proceed with additional treatment. We discussed treatment options with him today and he would like to move forward with enrolling in the REGULATory T-Cell Inhibition with Basiliximab (Simulect) during Recovery from Therapeutic Temozolomide-induced Lymphopenia during Anti-tumor Immunotherapy Targeted Against Cytomegalovirus in Patients with Newly Diagnosed Glioblastoma Multiforme (REGULATEe) clinical trial. The study design includes the following:   Leukapheresis collection with portion of cells, along with addition GM-CSF used to manufacture DC CMV pp65 Vaccine  Standard of Care Radiation with Temolozomide for 6 weeks, with Post-Radiation Temozolomide  Vaccines administered on Day 21 of cycle, every 2 weeks, for the first 6 weeks of study (for the first 3 Vaccines)  Basiliximab will be administered one week before vaccine dose one and two  Vaccines continue with monthly cycles of Temolozomide for a total of 8 Vaccines, and total 12 cycles of Temolozomide (until intolerance or tumor progression)    ALLERGIES:  has No Known Allergies.  MEDICATIONS: has a current medication list which includes the following prescription(s): acetaminophen, albuterol, bisacodyl, levetiracetam, methylprednisolone, multivitamin with minerals, ondansetron, ondansetron, oxycodone, temozolomide, temozolomide, tiotropium, diazepam, ibuprofen, and tamsulosin.  SURGICAL HISTORY:  Past Surgical History  Procedure Laterality Date  . Salivary gland surgery    . Brain tumor excision      FAMILY HISTORY: family history includes  Cancer in his  mother; Diabetes in his father and mother; Hypertension in his mother; Stroke in his mother.  SOCIAL HISTORY:  reports that he has been smoking Cigarettes.  He has been smoking about 0.00 packs per day. He has never used smokeless tobacco. He reports that he does not drink alcohol or use illicit drugs.  REVIEW OF SYSTEMS:  Other than that discussed above is noncontributory.  PHYSICAL EXAMINATION: ECOG PERFORMANCE STATUS: 1 - Symptomatic but completely ambulatory  Blood pressure 137/88, pulse 86, temperature 97.9 F (36.6 C), temperature source Oral, resp. rate 20, weight 213 lb 9.6 oz (96.888 kg).  GENERAL:alert, no distress and comfortable. No alopecia. SKIN: skin color, texture, turgor are normal, no rashes or significant lesions EYES: PERLA; Conjunctiva are pink and non-injected, sclera clear SINUSES: No redness or tenderness over maxillary or ethmoid sinuses OROPHARYNX:no exudate, no erythema on lips, buccal mucosa, or tongue. NECK: supple, thyroid normal size, non-tender, without nodularity. No masses CHEST: Increased AP diameter with no gynecomastia. LYMPH:  no palpable lymphadenopathy in the cervical, axillary or inguinal LUNGS: clear to auscultation and percussion with normal breathing effort HEART: regular rate & rhythm and no murmurs. ABDOMEN:abdomen soft, non-tender and normal bowel sounds MUSCULOSKELETAL:no cyanosis of digits and no clubbing. Range of motion normal. Left great toe with minimal swelling and no redness. NEURO: alert & oriented x 3 with fluent speech, no focal motor/sensory deficits   LABORATORY DATA: Infusion on 05/18/2014  Component Date Value Ref Range Status  . WBC 05/18/2014 12.9* 4.0 - 10.5 K/uL Final  . RBC 05/18/2014 4.59  4.22 - 5.81 MIL/uL Final  . Hemoglobin 05/18/2014 13.1  13.0 - 17.0 g/dL Final  . HCT 05/18/2014 39.3  39.0 - 52.0 % Final  . MCV 05/18/2014 85.6  78.0 - 100.0 fL Final  . MCH 05/18/2014 28.5  26.0 - 34.0 pg Final    . MCHC 05/18/2014 33.3  30.0 - 36.0 g/dL Final  . RDW 05/18/2014 13.6  11.5 - 15.5 % Final  . Platelets 05/18/2014 309  150 - 400 K/uL Final  . Neutrophils Relative % 05/18/2014 73  43 - 77 % Final  . Neutro Abs 05/18/2014 9.4* 1.7 - 7.7 K/uL Final  . Lymphocytes Relative 05/18/2014 21  12 - 46 % Final  . Lymphs Abs 05/18/2014 2.8  0.7 - 4.0 K/uL Final  . Monocytes Relative 05/18/2014 6  3 - 12 % Final  . Monocytes Absolute 05/18/2014 0.7  0.1 - 1.0 K/uL Final  . Eosinophils Relative 05/18/2014 0  0 - 5 % Final  . Eosinophils Absolute 05/18/2014 0.0  0.0 - 0.7 K/uL Final  . Basophils Relative 05/18/2014 0  0 - 1 % Final  . Basophils Absolute 05/18/2014 0.0  0.0 - 0.1 K/uL Final  Office Visit on 04/21/2014  Component Date Value Ref Range Status  . WBC 04/21/2014 11.8* 4.0 - 10.5 K/uL Final  . RBC 04/21/2014 4.45  4.22 - 5.81 MIL/uL Final  . Hemoglobin 04/21/2014 13.4  13.0 - 17.0 g/dL Final  . HCT 04/21/2014 38.2* 39.0 - 52.0 % Final  . MCV 04/21/2014 85.8  78.0 - 100.0 fL Final  . MCH 04/21/2014 30.1  26.0 - 34.0 pg Final  . MCHC 04/21/2014 35.1  30.0 - 36.0 g/dL Final  . RDW 04/21/2014 14.0  11.5 - 15.5 % Final  . Platelets 04/21/2014 338  150 - 400 K/uL Final  . Neutrophils Relative % 04/21/2014 65  43 - 77 % Final  . Neutro Abs 04/21/2014 7.8* 1.7 -  7.7 K/uL Final  . Lymphocytes Relative 04/21/2014 25  12 - 46 % Final  . Lymphs Abs 04/21/2014 3.0  0.7 - 4.0 K/uL Final  . Monocytes Relative 04/21/2014 8  3 - 12 % Final  . Monocytes Absolute 04/21/2014 0.9  0.1 - 1.0 K/uL Final  . Eosinophils Relative 04/21/2014 2  0 - 5 % Final  . Eosinophils Absolute 04/21/2014 0.2  0.0 - 0.7 K/uL Final  . Basophils Relative 04/21/2014 0  0 - 1 % Final  . Basophils Absolute 04/21/2014 0.0  0.0 - 0.1 K/uL Final  . Sodium 04/21/2014 135* 137 - 147 mEq/L Final  . Potassium 04/21/2014 3.5* 3.7 - 5.3 mEq/L Final  . Chloride 04/21/2014 97  96 - 112 mEq/L Final  . CO2 04/21/2014 23  19 - 32 mEq/L  Final  . Glucose, Bld 04/21/2014 93  70 - 99 mg/dL Final  . BUN 04/21/2014 19  6 - 23 mg/dL Final  . Creatinine, Ser 04/21/2014 1.16  0.50 - 1.35 mg/dL Final  . Calcium 04/21/2014 9.8  8.4 - 10.5 mg/dL Final  . Total Protein 04/21/2014 7.2  6.0 - 8.3 g/dL Final  . Albumin 04/21/2014 3.4* 3.5 - 5.2 g/dL Final  . AST 04/21/2014 20  0 - 37 U/L Final  . ALT 04/21/2014 27  0 - 53 U/L Final  . Alkaline Phosphatase 04/21/2014 88  39 - 117 U/L Final  . Total Bilirubin 04/21/2014 0.2* 0.3 - 1.2 mg/dL Final  . GFR calc non Af Amer 04/21/2014 68* >90 mL/min Final  . GFR calc Af Amer 04/21/2014 79* >90 mL/min Final   Comment: (NOTE)                          The eGFR has been calculated using the CKD EPI equation.                          This calculation has not been validated in all clinical situations.                          eGFR's persistently <90 mL/min signify possible Chronic Kidney                          Disease.  Marland Kitchen LDH 04/21/2014 217  94 - 250 U/L Final    PATHOLOGY: No new pathology.  Urinalysis No results found for this basename: colorurine,  appearanceur,  labspec,  phurine,  glucoseu,  hgbur,  bilirubinur,  ketonesur,  proteinur,  urobilinogen,  nitrite,  leukocytesur    RADIOGRAPHIC STUDIES: Mr Kizzie Fantasia Contrast  05/02/14   CLINICAL DATA:  Glioblastoma status post resection in 03/03/2014 and 04/06/2014. No chemoradiation yet received.  EXAM: MRI HEAD WITHOUT AND WITH CONTRAST  TECHNIQUE: Multiplanar, multiecho pulse sequences of the brain and surrounding structures were obtained without and with intravenous contrast.  CONTRAST:  9mL MULTIHANCE GADOBENATE DIMEGLUMINE 529 MG/ML IV SOLN  COMPARISON:  None.  FINDINGS: Sequelae of left frontal craniotomy and tumor resection are identified. Foci of susceptibility artifact in the anteromedial left frontal lobe are compatible with a small amount of blood products related to surgery. There is a small amount of extra-axial fluid at this  location, also compatible with recent surgery. Small areas of intrinsic T1 hyperintensity in this region also likely reflect blood products. There is relatively smooth  enhancement along the margins of the left frontal resection cavity measuring approximately 3 mm in thickness. No masslike enhancement is seen, although there is a small amount of somewhat ill defined enhancement extending slightly beyond the margins of the resection cavity laterally (series 13, image 15 and series 12, image 26). There is cortical and subcortical nonenhancing T2 hyperintensity in the medial left frontal lobe adjacent to the resection site which extends posteriorly into the region of the left cingulate gyrus and left genu of the corpus callosum. Some of this T2 signal abnormality demonstrates mildly restricted diffusion.  No abnormal enhancement is identified elsewhere in the brain. There are multiple small, scattered foci of T2 hyperintensity within the subcortical and deep cerebral white matter bilaterally and pons, nonspecific but compatible with mild chronic small vessel ischemic disease. There is mild generalized cerebral atrophy. There is no midline shift. Orbits are unremarkable. Mild bilateral ethmoid air cell, bilateral maxillary sinus, and left sphenoid sinus mucosal thickening is noted. Mastoid air cells are clear. Major intracranial vascular flow voids are preserved.  IMPRESSION: 1. Postoperative changes from left frontal resection. Enhancement at the margins of the resection site is likely postoperative. T2 signal abnormality extending posteriorly from the resection site may reflect residual nonenhancing tumor. 2. Mild chronic small vessel ischemic disease.   Electronically Signed   By: Logan Bores   On: 04/19/2014 11:33    ASSESSMENT:  #1. Glioblastoma multiforme left frontal lobe, status post resection with MRI evidence of residual disease.  #2. Enrolled in the [facility] trial for which she underwent leukapheresis  on 05/08/2014 and is currently receiving radiotherapy and taking daily Temodar with treatment and on 06/21/2014. #3. Chronic obstructive pulmonary disease, stable, smoking but wishing to stop. #4. Seizure disorder, controlled.  #5. Allergic rhinitis, not symptomatic. #6. Acute gouty arthritis, improved. #7. Symptomatic prostatic hypertrophy. #8. 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    PLAN:  #1. Diazepam 10 mg daily. #2. Flomax 0.4 mg nightly. #3. Milk of magnesia 45 cc nightly. #4. Continue Temodar 160 mg daily and radiotherapy 5 days per week. #5. Followup on 06/23/2014 with CBC and chem profile with plans to return to [place] on 06/27/2014 with MRI to be done the morning of that visit. #6. He was told to decrease smoking by one cigarette daily until down to 3 or 4 per day. I believe going to stop completely would be to anxiety provoking for this patient.   All questions were answered. The patient knows to call the clinic with any problems, questions or concerns. We can certainly see the patient much sooner if necessary.   I spent 25 minutes counseling the patient face to face. The total time spent in the appointment was 30 minutes.    Farrel Gobble, MD 05/18/2014 9:34 AM  DISCLAIMER:  This note was dictated with voice recognition software.  Similar sounding words can inadvertently be transcribed inaccurately and may not be corrected upon review.   He will radiate radiology he is 2 and he is on the and both you and the screw things

## 2014-05-18 NOTE — Progress Notes (Signed)
Cody Buchanan presented for labwork. Labs per MD order drawn via Peripheral Line 23 gauge needle inserted in left AC  Good blood return present. Procedure without incident.  Needle removed intact. Patient tolerated procedure well.

## 2014-05-18 NOTE — Patient Instructions (Addendum)
[facility] Discharge Instructions  RECOMMENDATIONS MADE BY THE CONSULTANT AND ANY TEST RESULTS WILL BE SENT TO YOUR REFERRING PHYSICIAN.  EXAM FINDINGS BY THE PHYSICIAN TODAY AND SIGNS OR SYMPTOMS TO REPORT TO CLINIC OR PRIMARY PHYSICIAN: Exam and findings as discussed by Dr. Barnet Glasgow.   -Will continue with temodar. -Decrease smoking by smoking 1 less cigarette daily -Use aleve for foot pain after completion of steroid (Medrol dosepak) -Take Milk of Magnesia 45 cc (3Tablespoons) daily   MEDICATIONS PRESCRIBED:  Valium 10 mg - take as directed Flomax - take as directed  INSTRUCTIONS/FOLLOW-UP: 6/26 for blood work and office visit.  Thank you for choosing [facility] to provide your oncology and hematology care.  To afford each patient quality time with our providers, please arrive at least 15 minutes before your scheduled appointment time.  With your help, our goal is to use those 15 minutes to complete the necessary work-up to ensure our physicians have the information they need to help with your evaluation and healthcare recommendations.    Effective January 1st, 2014, we ask that you re-schedule your appointment with our physicians should you arrive 10 or more minutes late for your appointment.  We strive to give you quality time with our providers, and arriving late affects you and other patients whose appointments are after yours.    Again, thank you for choosing [facility].  Our hope is that these requests will decrease the amount of time that you wait before being seen by our physicians.       _____________________________________________________________  Should you have questions after your visit to [facility], please contact our office at [phone] between the hours of 8:30 a.m. and 5:00 p.m.  Voicemails left after 4:30 p.m. will not be returned until the following business day.  For prescription refill requests,  have your pharmacy contact our office with your prescription refill request.

## 2014-05-19 ENCOUNTER — Ambulatory Visit (HOSPITAL_COMMUNITY): Payer: 59

## 2014-06-05 ENCOUNTER — Other Ambulatory Visit (HOSPITAL_COMMUNITY): Payer: Self-pay | Admitting: Hematology and Oncology

## 2014-06-05 ENCOUNTER — Telehealth (HOSPITAL_COMMUNITY): Payer: Self-pay

## 2014-06-05 MED ORDER — OXYCODONE HCL 5 MG PO CAPS: ORAL_CAPSULE | ORAL | Status: DC

## 2014-06-05 NOTE — Telephone Encounter (Signed)
Call from patient requesting refill for oxycodone 5mg .  Is taking 2 tablets at least 3 times daily for pain.  Still has a few tablets but will run out by tomorrow.

## 2014-06-21 ENCOUNTER — Telehealth: Payer: Self-pay | Admitting: Oncology

## 2014-06-21 NOTE — Telephone Encounter (Signed)
Opal regarding his disability paperwork from [place] said "throw away that paperwork."  He then said that he is going to call [employer] and have them send the paperwork to [facility] since he will be receiving care from them for the next year.  Verbalized agreement and notified Levada Dy, Estate manager/land agent.

## 2014-06-23 ENCOUNTER — Encounter (HOSPITAL_COMMUNITY): Payer: Medicaid Other | Attending: Hematology and Oncology

## 2014-06-23 ENCOUNTER — Encounter (HOSPITAL_COMMUNITY): Payer: Self-pay

## 2014-06-23 ENCOUNTER — Encounter (HOSPITAL_COMMUNITY): Payer: Medicaid Other

## 2014-06-23 VITALS — BP 145/85 | HR 96 | Temp 97.6°F | Resp 20 | Wt 205.4 lb

## 2014-06-23 DIAGNOSIS — F17200 Nicotine dependence, unspecified, uncomplicated: Secondary | ICD-10-CM

## 2014-06-23 DIAGNOSIS — M109 Gout, unspecified: Secondary | ICD-10-CM | POA: Insufficient documentation

## 2014-06-23 DIAGNOSIS — C711 Malignant neoplasm of frontal lobe: Secondary | ICD-10-CM

## 2014-06-23 DIAGNOSIS — C719 Malignant neoplasm of brain, unspecified: Secondary | ICD-10-CM | POA: Diagnosis present

## 2014-06-23 DIAGNOSIS — J449 Chronic obstructive pulmonary disease, unspecified: Secondary | ICD-10-CM

## 2014-06-23 LAB — CBC WITH DIFFERENTIAL/PLATELET
Basophils Absolute: 0 10*3/uL (ref 0.0–0.1)
Basophils Relative: 1 % (ref 0–1)
EOS ABS: 0.2 10*3/uL (ref 0.0–0.7)
EOS PCT: 2 % (ref 0–5)
HCT: 43.3 % (ref 39.0–52.0)
Hemoglobin: 14.6 g/dL (ref 13.0–17.0)
LYMPHS ABS: 1.3 10*3/uL (ref 0.7–4.0)
Lymphocytes Relative: 17 % (ref 12–46)
MCH: 29 pg (ref 26.0–34.0)
MCHC: 33.7 g/dL (ref 30.0–36.0)
MCV: 85.9 fL (ref 78.0–100.0)
Monocytes Absolute: 0.5 10*3/uL (ref 0.1–1.0)
Monocytes Relative: 6 % (ref 3–12)
Neutro Abs: 5.6 10*3/uL (ref 1.7–7.7)
Neutrophils Relative %: 74 % (ref 43–77)
PLATELETS: 241 10*3/uL (ref 150–400)
RBC: 5.04 MIL/uL (ref 4.22–5.81)
RDW: 13.9 % (ref 11.5–15.5)
WBC: 7.6 10*3/uL (ref 4.0–10.5)

## 2014-06-23 LAB — COMPREHENSIVE METABOLIC PANEL
ALBUMIN: 3.8 g/dL (ref 3.5–5.2)
ALT: 24 U/L (ref 0–53)
AST: 23 U/L (ref 0–37)
Alkaline Phosphatase: 88 U/L (ref 39–117)
BILIRUBIN TOTAL: 0.5 mg/dL (ref 0.3–1.2)
BUN: 12 mg/dL (ref 6–23)
CO2: 26 meq/L (ref 19–32)
CREATININE: 1.09 mg/dL (ref 0.50–1.35)
Calcium: 9.3 mg/dL (ref 8.4–10.5)
Chloride: 102 mEq/L (ref 96–112)
GFR calc Af Amer: 85 mL/min — ABNORMAL LOW (ref >90)
GFR, EST NON AFRICAN AMERICAN: 74 mL/min — AB (ref >90)
Glucose, Bld: 129 mg/dL — ABNORMAL HIGH (ref 70–99)
POTASSIUM: 4.2 meq/L (ref 3.7–5.3)
SODIUM: 141 meq/L (ref 137–147)
Total Protein: 7.5 g/dL (ref 6.0–8.3)

## 2014-06-23 LAB — URIC ACID: URIC ACID, SERUM: 8.8 mg/dL — AB (ref 4.0–7.8)

## 2014-06-23 MED ORDER — DIAZEPAM 10 MG PO TABS: ORAL_TABLET | ORAL | Status: DC

## 2014-06-23 MED ORDER — OXYCODONE HCL 5 MG PO CAPS: ORAL_CAPSULE | ORAL | Status: DC

## 2014-06-23 NOTE — Progress Notes (Signed)
LABS DRAWN FOR CMP, CBCD, URIC.

## 2014-06-23 NOTE — Patient Instructions (Signed)
[facility] Discharge Instructions  RECOMMENDATIONS MADE BY THE CONSULTANT AND ANY TEST RESULTS WILL BE SENT TO YOUR REFERRING PHYSICIAN.  Continue oxycodone and Valium.   Continue to decrease smoking.   Followup at [facility] on 07/05/2014 for MRI and reevaluation. Followup is to be determined by what is necessary to coordinate treatment locally. Please call us after your plan of care is decided at [facility].    Thank you for choosing [facility] to provide your oncology and hematology care.  To afford each patient quality time with our providers, please arrive at least 15 minutes before your scheduled appointment time.  With your help, our goal is to use those 15 minutes to complete the necessary work-up to ensure our physicians have the information they need to help with your evaluation and healthcare recommendations.    Effective January 1st, 2014, we ask that you re-schedule your appointment with our physicians should you arrive 10 or more minutes late for your appointment.  We strive to give you quality time with our providers, and arriving late affects you and other patients whose appointments are after yours.    Again, thank you for choosing [facility].  Our hope is that these requests will decrease the amount of time that you wait before being seen by our physicians.       _____________________________________________________________  Should you have questions after your visit to [facility], please contact our office at [phone] between the hours of 8:30 a.m. and 4:30 p.m.  Voicemails left after 4:30 p.m. will not be returned until the following business day.  For prescription refill requests, have your pharmacy contact our office with your prescription refill request.    _______________________________________________________________  We hope that we have given you very good care.  You may receive a patient  satisfaction survey in the mail, please complete it and return it as soon as possible.  We value your feedback!

## 2014-06-23 NOTE — Progress Notes (Signed)
Cody Buchanan  OFFICE PROGRESS NOTE  Cody Hampshire, MD [name]  DIAGNOSIS: Acute gouty arthritis - Plan: Uric acid  Malignant brain tumor  Chief Complaint  Patient presents with  . Glioblastoma multiforme and    CURRENT THERAPY: Completed combined modality therapy postoperatively for glioblastoma multiforme A., currently registered in the [facility].  INTERVAL HISTORY: Cody Buchanan 58 y.o. male returns for radiotherapy plus Temodar was completed on 06/20/2014 with radiation therapy completed on 06/22/2014.  Although anxious, the patient is in good spirits. He denies any nausea, vomiting, diarrhea, constipation, epistaxis, melena, hematochezia, hematuria, incontinence, or seizure activity. Headaches and knee pain are controlled well oxycodone. He uses value at bedtime in order to sleep better.  MEDICAL HISTORY: Past Medical History  Diagnosis Date  . Glioblastoma   . GERD (gastroesophageal reflux disease)   . Seizures     INTERIM HISTORY: has Malignant brain tumor, left frontal glioblastoma multiforme grade 4; Chronic obstructive pulmonary disease; Seizure disorder; Allergic rhinitis; and Acute gouty arthritis on his problem list.    ALLERGIES:  has No Known Allergies.  MEDICATIONS: has a current medication list which includes the following prescription(s): acetaminophen, albuterol, bisacodyl, diazepam, ibuprofen, levetiracetam, multivitamin with minerals, oxycodone, tamsulosin, tiotropium, methylprednisolone, ondansetron, ondansetron, temozolomide, and temozolomide.  SURGICAL HISTORY:  Past Surgical History  Procedure Laterality Date  . Salivary gland surgery    . Brain tumor excision      FAMILY HISTORY: family history includes Cancer in his mother; Diabetes in his father and mother; Hypertension in his mother; Stroke in his mother.  SOCIAL HISTORY:  reports that he has  been smoking Cigarettes.  He has been smoking about 0.00 packs per day. He has never used smokeless tobacco. He reports that he does not drink alcohol or use illicit drugs.  REVIEW OF SYSTEMS:  Other than that discussed above is noncontributory.  PHYSICAL EXAMINATION: ECOG PERFORMANCE STATUS: 1 - Symptomatic but completely ambulatory  Blood pressure 145/85, pulse 96, temperature 97.6 F (36.4 C), temperature source Oral, resp. rate 20, weight 205 lb 6.4 oz (93.169 kg), SpO2 97.00%.  GENERAL:alert, no distress and comfortable SKIN: skin color, texture, turgor are normal, no rashes or significant lesions. Alopecia the scalp with surgical wound well healed. EYES: PERLA; Conjunctiva are pink and non-injected, sclera clear SINUSES: No redness or tenderness over maxillary or ethmoid sinuses OROPHARYNX:no exudate, no erythema on lips, buccal mucosa, or tongue. NECK: supple, thyroid normal size, non-tender, without nodularity. No masses CHEST: Increased AP diameter with no gynecomastia. LYMPH:  no palpable lymphadenopathy in the cervical, axillary or inguinal LUNGS: clear to auscultation and percussion with normal breathing effort HEART: regular rate & rhythm and no murmurs. ABDOMEN:abdomen soft, non-tender and normal bowel sounds MUSCULOSKELETAL:no cyanosis of digits and no clubbing. Range of motion normal.  NEURO: alert & oriented x 3 with fluent speech, no focal motor/sensory deficits   LABORATORY DATA: Lab on 06/23/2014  Component Date Value Ref Range Status  . Sodium 06/23/2014 141  137 - 147 mEq/L Final  . Potassium 06/23/2014 4.2  3.7 - 5.3 mEq/L Final  . Chloride 06/23/2014 102  96 - 112 mEq/L Final  . CO2 06/23/2014 26  19 - 32 mEq/L Final  . Glucose, Bld 06/23/2014 129* 70 - 99 mg/dL Final  . BUN 06/23/2014 12  6 - 23 mg/dL Final  . Creatinine, Ser 06/23/2014 1.09  0.50 - 1.35  mg/dL Final  . Calcium 06/23/2014 9.3  8.4 - 10.5 mg/dL Final  . Total Protein 06/23/2014 7.5  6.0 -  8.3 g/dL Final  . Albumin 06/23/2014 3.8  3.5 - 5.2 g/dL Final  . AST 06/23/2014 23  0 - 37 U/L Final  . ALT 06/23/2014 24  0 - 53 U/L Final  . Alkaline Phosphatase 06/23/2014 88  39 - 117 U/L Final  . Total Bilirubin 06/23/2014 0.5  0.3 - 1.2 mg/dL Final  . GFR calc non Af Amer 06/23/2014 74* >90 mL/min Final  . GFR calc Af Amer 06/23/2014 85* >90 mL/min Final   Comment: (NOTE)                          The eGFR has been calculated using the CKD EPI equation.                          This calculation has not been validated in all clinical situations.                          eGFR's persistently <90 mL/min signify possible Chronic Kidney                          Disease.  . WBC 06/23/2014 7.6  4.0 - 10.5 K/uL Final  . RBC 06/23/2014 5.04  4.22 - 5.81 MIL/uL Final  . Hemoglobin 06/23/2014 14.6  13.0 - 17.0 g/dL Final  . HCT 06/23/2014 43.3  39.0 - 52.0 % Final  . MCV 06/23/2014 85.9  78.0 - 100.0 fL Final  . MCH 06/23/2014 29.0  26.0 - 34.0 pg Final  . MCHC 06/23/2014 33.7  30.0 - 36.0 g/dL Final  . RDW 06/23/2014 13.9  11.5 - 15.5 % Final  . Platelets 06/23/2014 241  150 - 400 K/uL Final  . Neutrophils Relative % 06/23/2014 74  43 - 77 % Final  . Neutro Abs 06/23/2014 5.6  1.7 - 7.7 K/uL Final  . Lymphocytes Relative 06/23/2014 17  12 - 46 % Final  . Lymphs Abs 06/23/2014 1.3  0.7 - 4.0 K/uL Final  . Monocytes Relative 06/23/2014 6  3 - 12 % Final  . Monocytes Absolute 06/23/2014 0.5  0.1 - 1.0 K/uL Final  . Eosinophils Relative 06/23/2014 2  0 - 5 % Final  . Eosinophils Absolute 06/23/2014 0.2  0.0 - 0.7 K/uL Final  . Basophils Relative 06/23/2014 1  0 - 1 % Final  . Basophils Absolute 06/23/2014 0.0  0.0 - 0.1 K/uL Final  . Uric Acid, Serum 06/23/2014 8.8* 4.0 - 7.8 mg/dL Final    PATHOLOGY: Grade 4 glioblastoma multiforme   Urinalysis No results found for this basename: colorurine,  appearanceur,  labspec,  phurine,  glucoseu,  hgbur,  bilirubinur,  ketonesur,  proteinur,   urobilinogen,  nitrite,  leukocytesur    RADIOGRAPHIC STUDIES: No results found.  ASSESSMENT:  #1. Glioblastoma multiforme left frontal lobe, status post resection with MRI evidence of residual disease.  #2. Enrolled in the [place] trial for which she underwent leukapheresis on 05/08/2014 and completed combined without a therapy with daily Temodar for 42 days ending on 06/20/2014 with radiotherapy completed on 06/22/2014..  #3. Chronic obstructive pulmonary disease, stable, smoking but wishing to stop.  #4. Seizure disorder, controlled.  #5. Allergic rhinitis, not symptomatic.  #6. Acute gouty arthritis,  improved.  #7. Symptomatic prostatic hypertrophy.  #8. Anxiety neurosis controlled with diazepam.       PLAN:  #1. Continue oxycodone and Valium. #2. Continue to decrease smoking. #3. Followup at [facility] on 07/05/2014 for MRI brain and reevaluation. Followup is to be determined by what is necessary to coordinate treatment locally.    All questions were answered. The patient knows to call the clinic with any problems, questions or concerns. We can certainly see the patient much sooner if necessary.   I spent 25 minutes counseling the patient face to face. The total time spent in the appointment was 30 minutes.    Doroteo Bradford, MD 06/23/2014 10:45 AM  DISCLAIMER:  This note was dictated with voice recognition software.  Similar sounding words can inadvertently be transcribed inaccurately and may not be corrected upon review.

## 2014-07-06 ENCOUNTER — Other Ambulatory Visit (HOSPITAL_COMMUNITY): Payer: Self-pay | Admitting: Oncology

## 2014-07-06 DIAGNOSIS — C719 Malignant neoplasm of brain, unspecified: Secondary | ICD-10-CM

## 2014-07-06 MED ORDER — OXYCODONE HCL 5 MG PO CAPS: ORAL_CAPSULE | ORAL | Status: DC

## 2014-07-10 ENCOUNTER — Other Ambulatory Visit (HOSPITAL_COMMUNITY): Payer: Self-pay

## 2014-07-10 DIAGNOSIS — C719 Malignant neoplasm of brain, unspecified: Secondary | ICD-10-CM

## 2014-07-11 ENCOUNTER — Other Ambulatory Visit (HOSPITAL_COMMUNITY): Payer: Self-pay | Admitting: Hematology and Oncology

## 2014-07-11 ENCOUNTER — Encounter (HOSPITAL_BASED_OUTPATIENT_CLINIC_OR_DEPARTMENT_OTHER): Payer: Medicaid Other

## 2014-07-11 ENCOUNTER — Encounter (HOSPITAL_COMMUNITY): Payer: Medicaid Other | Attending: Hematology and Oncology

## 2014-07-11 ENCOUNTER — Encounter (HOSPITAL_COMMUNITY): Payer: Self-pay

## 2014-07-11 VITALS — BP 120/73 | HR 97 | Temp 98.1°F | Resp 18 | Wt 207.4 lb

## 2014-07-11 DIAGNOSIS — C719 Malignant neoplasm of brain, unspecified: Secondary | ICD-10-CM

## 2014-07-11 DIAGNOSIS — J438 Other emphysema: Secondary | ICD-10-CM

## 2014-07-11 DIAGNOSIS — J449 Chronic obstructive pulmonary disease, unspecified: Secondary | ICD-10-CM | POA: Diagnosis not present

## 2014-07-11 DIAGNOSIS — C711 Malignant neoplasm of frontal lobe: Secondary | ICD-10-CM

## 2014-07-11 DIAGNOSIS — G40909 Epilepsy, unspecified, not intractable, without status epilepticus: Secondary | ICD-10-CM

## 2014-07-11 DIAGNOSIS — J301 Allergic rhinitis due to pollen: Secondary | ICD-10-CM

## 2014-07-11 LAB — CBC WITH DIFFERENTIAL/PLATELET
Basophils Absolute: 0 10*3/uL (ref 0.0–0.1)
Basophils Relative: 1 % (ref 0–1)
Eosinophils Absolute: 0.2 10*3/uL (ref 0.0–0.7)
Eosinophils Relative: 2 % (ref 0–5)
HEMATOCRIT: 42.4 % (ref 39.0–52.0)
HEMOGLOBIN: 14.6 g/dL (ref 13.0–17.0)
Lymphocytes Relative: 14 % (ref 12–46)
Lymphs Abs: 1.1 10*3/uL (ref 0.7–4.0)
MCH: 29.1 pg (ref 26.0–34.0)
MCHC: 34.4 g/dL (ref 30.0–36.0)
MCV: 84.5 fL (ref 78.0–100.0)
MONO ABS: 0.5 10*3/uL (ref 0.1–1.0)
MONOS PCT: 7 % (ref 3–12)
NEUTROS ABS: 5.8 10*3/uL (ref 1.7–7.7)
Neutrophils Relative %: 76 % (ref 43–77)
Platelets: 200 10*3/uL (ref 150–400)
RBC: 5.02 MIL/uL (ref 4.22–5.81)
RDW: 13.5 % (ref 11.5–15.5)
WBC: 7.6 10*3/uL (ref 4.0–10.5)

## 2014-07-11 LAB — COMPREHENSIVE METABOLIC PANEL
ALT: 17 U/L (ref 0–53)
ANION GAP: 11 (ref 5–15)
AST: 19 U/L (ref 0–37)
Albumin: 3.7 g/dL (ref 3.5–5.2)
Alkaline Phosphatase: 84 U/L (ref 39–117)
BILIRUBIN TOTAL: 0.4 mg/dL (ref 0.3–1.2)
BUN: 14 mg/dL (ref 6–23)
CO2: 26 mEq/L (ref 19–32)
Calcium: 9.3 mg/dL (ref 8.4–10.5)
Chloride: 103 mEq/L (ref 96–112)
Creatinine, Ser: 1.08 mg/dL (ref 0.50–1.35)
GFR calc Af Amer: 86 mL/min — ABNORMAL LOW (ref >90)
GFR, EST NON AFRICAN AMERICAN: 74 mL/min — AB (ref >90)
GLUCOSE: 90 mg/dL (ref 70–99)
Potassium: 4.2 mEq/L (ref 3.7–5.3)
Sodium: 140 mEq/L (ref 137–147)
Total Protein: 7.2 g/dL (ref 6.0–8.3)

## 2014-07-11 MED ORDER — TAMSULOSIN HCL 0.4 MG PO CAPS: ORAL_CAPSULE | ORAL | Status: DC

## 2014-07-11 NOTE — Progress Notes (Signed)
LABS DRAWN FOR [facility]CD,CMP 

## 2014-07-11 NOTE — Patient Instructions (Signed)
[place] Discharge Instructions  RECOMMENDATIONS MADE BY THE CONSULTANT AND ANY TEST RESULTS WILL BE SENT TO YOUR REFERRING PHYSICIAN.  We no longer need to see you at this clinic since you ar being followed at [facility].  Thank you for choosing [facility] to provide your oncology and hematology care.  To afford each patient quality time with our providers, please arrive at least 15 minutes before your scheduled appointment time.  With your help, our goal is to use those 15 minutes to complete the necessary work-up to ensure our physicians have the information they need to help with your evaluation and healthcare recommendations.    Effective January 1st, 2014, we ask that you re-schedule your appointment with our physicians should you arrive 10 or more minutes late for your appointment.  We strive to give you quality time with our providers, and arriving late affects you and other patients whose appointments are after yours.    Again, thank you for choosing [facility].  Our hope is that these requests will decrease the amount of time that you wait before being seen by our physicians.       _____________________________________________________________  Should you have questions after your visit to [facility], please contact our office at [phone] between the hours of 8:30 a.m. and 4:30 p.m.  Voicemails left after 4:30 p.m. will not be returned until the following business day.  For prescription refill requests, have your pharmacy contact our office with your prescription refill request.    _______________________________________________________________  We hope that we have given you very good care.  You may receive a patient satisfaction survey in the mail, please complete it and return it as soon as possible.  We value your feedback!  _______________________________________________________________  Have you asked about our  STAR program?  STAR stands for Survivorship Training and Rehabilitation, and this is a nationally recognized cancer care program that focuses on survivorship and rehabilitation.  Cancer and cancer treatments may cause problems, such as, pain, making you feel tired and keeping you from doing the things that you need or want to do. Cancer rehabilitation can help. Our goal is to reduce these troubling effects and help you have the best quality of life possible.  You may receive a survey from a nurse that asks questions about your current state of health.  Based on the survey results, all eligible patients will be referred to the [facility] program for an evaluation so we can better serve you!  A frequently asked questions sheet is available upon request.

## 2014-07-11 NOTE — Progress Notes (Signed)
[place]  OFFICE PROGRESS NOTE  [name], MD [place] [address]  DIAGNOSIS: Malignant brain tumor  Seizure disorder  Other emphysema  Allergic rhinitis due to pollen  Chief Complaint  Patient presents with  . Glioblastoma multiforme    CURRENT THERAPY: Temodar 430 mg daily for 5 days every 28 days enrolled in REGULATE-e trial at [facility].  INTERVAL HISTORY: Cody Buchanan 58 y.o. male returns for followup after completion of combined modality therapy with low-dose Temodar plus radiation completed on 06/20/2014 for glioblastoma multiforme, status post resection with residual disease seen on MRI done on 04/19/2014. He was seen at [facility] on 07/05/2014 and will now enter into the next phase of his clinical trial taking Temodar 430 mg daily for 5 days every 28 days started on 07/07/2014 with administration of vaccine prepared from his own lymphocytes according to the protocol schedule at [facility].  His MRI done on 07/05/2014 showed improvement with no evidence of disease and filling in of the surgical cavity. He has had no seizure activity. He denies any peripheral paresthesias but does get constipated with Zofran. He denies any nausea, vomiting, PND, orthopnea, palpitations, and does have headaches  which oxycodone relieves. He denies any incontinence, skin rash, joint discomfort, or lower extremity swelling or redness. There is a denies chest pain, PND, orthopnea, or palpitations.   MEDICAL HISTORY: Past Medical History  Diagnosis Date  . Glioblastoma   . GERD (gastroesophageal reflux disease)   . Seizures     INTERIM HISTORY: has Malignant brain tumor, left frontal glioblastoma multiforme grade 4; Chronic obstructive pulmonary disease; Seizure disorder; Allergic rhinitis; and Acute gouty arthritis on his problem list.    ALLERGIES:  has No Known Allergies.  MEDICATIONS: has a current medication list  which includes the following prescription(s): acetaminophen, albuterol, bisacodyl, diazepam, ibuprofen, levetiracetam, multivitamin with minerals, ondansetron, ondansetron, oxycodone, tamsulosin, temozolomide, temozolomide, tiotropium, and methylprednisolone.  SURGICAL HISTORY:  Past Surgical History  Procedure Laterality Date  . Salivary gland surgery    . Brain tumor excision      FAMILY HISTORY: family history includes Cancer in his mother; Diabetes in his father and mother; Hypertension in his mother; Stroke in his mother.  SOCIAL HISTORY:  reports that he has been smoking Cigarettes.  He has been smoking about 0.00 packs per day. He has never used smokeless tobacco. He reports that he does not drink alcohol or use illicit drugs.  REVIEW OF SYSTEMS:  Other than that discussed above is noncontributory.  PHYSICAL EXAMINATION: ECOG PERFORMANCE STATUS: 1 - Symptomatic but completely ambulatory  Blood pressure 120/73, pulse 97, temperature 98.1 F (36.7 C), temperature source Oral, resp. rate 18, weight 207 lb 6.4 oz (94.076 kg), SpO2 96.00%.  GENERAL:alert, no distress and comfortable SKIN: skin color, texture, turgor are normal, no rashes or significant lesions. Scalp incision is well-healed. EYES: PERLA; Conjunctiva are pink and non-injected, sclera clear SINUSES: No redness or tenderness over maxillary or ethmoid sinuses OROPHARYNX:no exudate, no erythema on lips, buccal mucosa, or tongue. NECK: supple, thyroid normal size, non-tender, without nodularity. No masses CHEST: Increased AP diameter with no breast masses. LYMPH:  no palpable lymphadenopathy in the cervical, axillary or inguinal LUNGS: clear to auscultation and percussion with normal breathing effort HEART: regular rate & rhythm and no murmurs. ABDOMEN:abdomen soft, non-tender and normal bowel sounds MUSCULOSKELETAL:no cyanosis of digits and no clubbing. Range of motion normal.  NEURO: alert &  oriented x 3 with fluent  speech, no focal motor/sensory deficits   LABORATORY DATA: Lab on 07/11/2014  Component Date Value Ref Range Status  . WBC 07/11/2014 7.6  4.0 - 10.5 K/uL Final  . RBC 07/11/2014 5.02  4.22 - 5.81 MIL/uL Final  . Hemoglobin 07/11/2014 14.6  13.0 - 17.0 g/dL Final  . HCT 07/11/2014 42.4  39.0 - 52.0 % Final  . MCV 07/11/2014 84.5  78.0 - 100.0 fL Final  . MCH 07/11/2014 29.1  26.0 - 34.0 pg Final  . MCHC 07/11/2014 34.4  30.0 - 36.0 g/dL Final  . RDW 07/11/2014 13.5  11.5 - 15.5 % Final  . Platelets 07/11/2014 200  150 - 400 K/uL Final  . Neutrophils Relative % 07/11/2014 76  43 - 77 % Final  . Neutro Abs 07/11/2014 5.8  1.7 - 7.7 K/uL Final  . Lymphocytes Relative 07/11/2014 14  12 - 46 % Final  . Lymphs Abs 07/11/2014 1.1  0.7 - 4.0 K/uL Final  . Monocytes Relative 07/11/2014 7  3 - 12 % Final  . Monocytes Absolute 07/11/2014 0.5  0.1 - 1.0 K/uL Final  . Eosinophils Relative 07/11/2014 2  0 - 5 % Final  . Eosinophils Absolute 07/11/2014 0.2  0.0 - 0.7 K/uL Final  . Basophils Relative 07/11/2014 1  0 - 1 % Final  . Basophils Absolute 07/11/2014 0.0  0.0 - 0.1 K/uL Final  . Sodium 07/11/2014 140  137 - 147 mEq/L Final  . Potassium 07/11/2014 4.2  3.7 - 5.3 mEq/L Final  . Chloride 07/11/2014 103  96 - 112 mEq/L Final  . CO2 07/11/2014 26  19 - 32 mEq/L Final  . Glucose, Bld 07/11/2014 90  70 - 99 mg/dL Final  . BUN 07/11/2014 14  6 - 23 mg/dL Final  . Creatinine, Ser 07/11/2014 1.08  0.50 - 1.35 mg/dL Final  . Calcium 07/11/2014 9.3  8.4 - 10.5 mg/dL Final  . Total Protein 07/11/2014 7.2  6.0 - 8.3 g/dL Final  . Albumin 07/11/2014 3.7  3.5 - 5.2 g/dL Final  . AST 07/11/2014 19  0 - 37 U/L Final  . ALT 07/11/2014 17  0 - 53 U/L Final  . Alkaline Phosphatase 07/11/2014 84  39 - 117 U/L Final  . Total Bilirubin 07/11/2014 0.4  0.3 - 1.2 mg/dL Final  . GFR calc non Af Amer 07/11/2014 74* >90 mL/min Final  . GFR calc Af Amer 07/11/2014 86* >90 mL/min Final   Comment: (NOTE)                           The eGFR has been calculated using the CKD EPI equation.                          This calculation has not been validated in all clinical situations.                          eGFR's persistently <90 mL/min signify possible Chronic Kidney                          Disease.  . Anion gap 07/11/2014 11  5 - 15 Final  Lab on 06/23/2014  Component Date Value Ref Range Status  . Sodium 06/23/2014 141  137 - 147 mEq/L Final  . Potassium 06/23/2014 4.2  3.7 - 5.3 mEq/L Final  . Chloride 06/23/2014 102  96 - 112 mEq/L Final  . CO2 06/23/2014 26  19 - 32 mEq/L Final  . Glucose, Bld 06/23/2014 129* 70 - 99 mg/dL Final  . BUN 06/23/2014 12  6 - 23 mg/dL Final  . Creatinine, Ser 06/23/2014 1.09  0.50 - 1.35 mg/dL Final  . Calcium 06/23/2014 9.3  8.4 - 10.5 mg/dL Final  . Total Protein 06/23/2014 7.5  6.0 - 8.3 g/dL Final  . Albumin 06/23/2014 3.8  3.5 - 5.2 g/dL Final  . AST 06/23/2014 23  0 - 37 U/L Final  . ALT 06/23/2014 24  0 - 53 U/L Final  . Alkaline Phosphatase 06/23/2014 88  39 - 117 U/L Final  . Total Bilirubin 06/23/2014 0.5  0.3 - 1.2 mg/dL Final  . GFR calc non Af Amer 06/23/2014 74* >90 mL/min Final  . GFR calc Af Amer 06/23/2014 85* >90 mL/min Final   Comment: (NOTE)                          The eGFR has been calculated using the CKD EPI equation.                          This calculation has not been validated in all clinical situations.                          eGFR's persistently <90 mL/min signify possible Chronic Kidney                          Disease.  . WBC 06/23/2014 7.6  4.0 - 10.5 K/uL Final  . RBC 06/23/2014 5.04  4.22 - 5.81 MIL/uL Final  . Hemoglobin 06/23/2014 14.6  13.0 - 17.0 g/dL Final  . HCT 06/23/2014 43.3  39.0 - 52.0 % Final  . MCV 06/23/2014 85.9  78.0 - 100.0 fL Final  . MCH 06/23/2014 29.0  26.0 - 34.0 pg Final  . MCHC 06/23/2014 33.7  30.0 - 36.0 g/dL Final  . RDW 06/23/2014 13.9  11.5 - 15.5 % Final  . Platelets 06/23/2014 241  150 - 400  K/uL Final  . Neutrophils Relative % 06/23/2014 74  43 - 77 % Final  . Neutro Abs 06/23/2014 5.6  1.7 - 7.7 K/uL Final  . Lymphocytes Relative 06/23/2014 17  12 - 46 % Final  . Lymphs Abs 06/23/2014 1.3  0.7 - 4.0 K/uL Final  . Monocytes Relative 06/23/2014 6  3 - 12 % Final  . Monocytes Absolute 06/23/2014 0.5  0.1 - 1.0 K/uL Final  . Eosinophils Relative 06/23/2014 2  0 - 5 % Final  . Eosinophils Absolute 06/23/2014 0.2  0.0 - 0.7 K/uL Final  . Basophils Relative 06/23/2014 1  0 - 1 % Final  . Basophils Absolute 06/23/2014 0.0  0.0 - 0.1 K/uL Final  . Uric Acid, Serum 06/23/2014 8.8* 4.0 - 7.8 mg/dL Final    PATHOLOGY: Brain Biomarkers (IHC, FISH)04/07/2014  [place]  Component Name Value Range  Case Report Image Cytometry                                   Case: [id number]  Authorizing Provider:  Baruch Goldmann, MD  Collected:           04/07/2014 [phone]             Pathologist:           Joylene John, MD       Received:            04/11/2014 0947             Specimen:    Brain, [id number]                                                                       Proliferation Index IHC Analysis This result contains rich text formatting which cannot be displayed here.   MGMT IHC Differential Analysis This result contains rich text formatting which cannot be displayed here.   Epidermal Growth Factor Receptor IHC Analysis This result contains rich text formatting which cannot be displayed here.   EGFR VIII IHC Analysis This result contains rich text formatting which cannot be displayed here.   EGFR/CEP7 FISH Analysis This result contains rich text formatting which cannot be displayed here.   CMET/CEP7 FISH Analysis This result contains rich text formatting which cannot be displayed here.   PTEN/CEP10 FISH Analysis This result contains rich text formatting which cannot be displayed here.   Tissue Type  BRAIN TISSUE CONTAINING  GLIOBLASTOMA (WHO GRADE IV; [id number], BLOCK B1).   Methodology  ANALYTE SPECIFIC REAGENT: These tests were developed and their performance characteristics determined by the [facility], [facility].  They have not been cleared or approved by the FDA. The FDA has determined that such clearance or approval is not necessary.  These tests are used for clinical purposes.  They should not be regarded as investigational or for research. This laboratory is certified under the [place] (CLIA) as qualified to perform high complexity clinical testing.   PROLIFERATION INDEX METHODOLOGY: Immunoperoxidase staining of proliferating cells is performed on formalin-fixed, paraffin-embedded tissue sections using a Ki-67 monoclonal antibody (clone MIB-1, [place]). Antigen retrieval is performed on the BOND autostainer with heat induced epitope retrieval solution 2 (ER2) for 20 minutes. Detection was accomplished using BOND Refine HRP Polymer Detection Kit on the [employer]. The MIB-1 percent positive nuclear area is a reflection of the proliferating cell population. H&E stained slides and immunoperoxidase stained slides are examined microscopically, the tumor area is identified, and the presence of cells expressing Ki-67 antigen is verified by a pathologist. The percentage of tumor cell nuclei expressing Ki-67 antigen is quantitated using the Advanced Cellular Imaging System (ACIS). The image analyzer measures the percentage of nuclei staining positively with MIB-1 clone within the fields of interest. The MIB-1 percent positive nuclear area is a reflection of the proliferating cell population. The digital image analyzer results are then correlated with the microscopic examination of the tumor area, and an attending pathologist verifies the final report.  MGMT DIFFERENTIAL ANALYSIS:    Staining:  A sequential staining method was performed on the Bond Autostainer ([employer])  using the [employer] Refine Detection systems.  The antibody specifications are as follows:  MGMT (clone: MT 3.1, [place]; used at 1:20), CD45 (clone: NCL-LCA, [employer]; used at 1:50), and  HAM-56 (clone: HAM-56, [employer]; used at 1:50).  A negative control is performed for each patient case.  External controls for HAM-56, CD45, and MGMT are performed with each assay.  Analysis:  An area containing tumor cells and the least amount of lymphocytic infiltration is selected for analysis.  A single stain IHC slide for CD45 is reviewed manually in the selected tumor area.  A single stain IHC slide for MGMT is reviewed manually in the selected tumor area. The percentage of tumor cell nuclei expressing MGMT antigen is quantitated using the Advanced Cellular Imaging System (ACIS). The image analyzer measures the percentage of nuclei staining positively with MGMT MT 3.1 clone within the fields of interest. The digital image analyzer results are then correlated with the microscopic examination of the tumor area and an attending pathologist verifies the final report. A triple stain IHC slide for Ham-56/CD45/MGMT is reviewed manually in the selected tumor area.  A threshold of >15% is considered positive for MGMT expression.  DAKO EGFR PHARMDX METHODOLOGY:  The EGFR pharmDx kit was used exactly as specified in the manufacturer's directions for use on the [employer].  EGFR pharmDx test results are interpreted as positive or negative using membrane staining as the evaluable structure.  Positivity for EGFR expression in colon cancer is defined as greater than or equal to 1% of the tumor cells exhibiting membrane staining above background level; membrane staining is considered to be present whether or not it is complete or incomplete circumferential staining.  If fewer than 1% of the colon cancer cells exhibit membrane staining, then the result is interpreted as negative.    EGFR vIII IMMUNOHISTOCHEMISTRY:  Immunoperoxidase staining and detection of EGFR vIII is performed on formalin-fixed, paraffin-embedded tissue sections using L8A4 EGFR vIII murine monoclonal antibody (diluted 1:250) which is prepared and purified at [facility]. Antigen retrieval is performed on the BOND autostainer with heat induced (ER2) epitope retrieval solution 2 for 20 minutes. Detection is accomplished using the BOND Refine HRP Polymer Detection Kit. `The percentage of tumor cell nuclei expressing EGFR vIII antigen is quantitated using the Advanced Cellular Imaging System (ACIS). The image analyzer measures the percentage of nuclei staining positively with EGFR vIII L8A4 clone within the fields of interest. The digital image analyzer results are then correlated with the microscopic examination of the tumor area and an attending pathologist verifies the final report.  A threshold of >1% is considered positive for EGFR vIII expression.  EGFr (7p12) FISH: A dual color interphase fluorescence in situ hybridization (FISH) assay was performed on paraffin-embedded sections of the tumor using the epidermal growth factor receptor (7p12) probe in combination with the CEP 7 probe from [employer]. The number of cells containing amplification or gain is scored out of a total 100-cell count.  EGFR AMPLIFICATION is reported if > 10% of cells exhibit an EGFR:CEP7 ratio >3.0. EGFR AMPLIFICATION is reported if >15% of cells exhibit an EGFR copy number >5. EGFR GAIN is reported if >15% of cells exhibit an EGFR copy number >2. CEP 7 AMPLIFICATION is reported if >15% of cells exhibit a CEP 7 copy number >5. CEP 7 GAIN is reported if >15% of cells exhibit a CEP 7 copy number >2.  MET (7q31) FISH:  A dual color interphase fluorescence in situ hybridization (FISH) assay was performed on paraffin-embedded sections of the tumor using the mesenchymal-epithelial-transition factor (7q31) probe in combination with the CEP 7 probe from [employer]. The number of cells containing amplification or  gain is scored out of a total 100-cell count.  MET AMPLIFICATION is reported if > 10% of cells exhibit a WUJ:WJX9 ratio >3.0. MET AMPLIFICATION is reported if >15% of cells exhibit a MET copy number >5. MET GAIN is reported if >15% of cells exhibit a MET copy number >2. CEP 7 AMPLIFICATION is reported if >15% of cells exhibit a CEP 7 copy number >5. CEP 7 GAIN is reported if >15% of cells exhibit a CEP 7 copy number >2.  PTEN (10q23) FISH: A dual color interphase fluorescence in situ hybridization (FISH) assay was performed on paraffin-embedded sections of the tumor using the PTEN (10q23) probe in combination with the CEP 10 probe from [employer]. The number of cells containing deletion or gain is scored out of a total 100-cell count.  PTEN DELETION is reported if >50% of cells exhibit a PTEN:CEP10 ratio <1.0. PTEN DELETION is reported if >30% of cells exhibit a PTEN copy number <2. PTEN HOMOZYGOUS DELETION is reported if >50% of cells exhibit a PTEN copy number = 0. PTEN GAIN is reported if the average PTEN copy number is >2.5.  CEP 10 DELETION is reported if >30% of cells exhibit a CEP 10 copy number <2. CEP 10 GAIN is reported if the average CEP 10 copy number is >2.5.   Interpretation   GLIOMA IMMUNOHISTOCHEMISTRY INTERPRETATION:    CD45 manual score: 30%  MGMT manual score: 20%  MGMT by ACIS score: 21% Ham-56 negative, CD45 negative, MGMT positive cells by triple stain manual score: <5%   IHC Antibody:  Interpretation  Score  (%)of Tumor Cells     ACIS III                                (0-3+)  Exhibiting Staining   SCORE   MGMT TRIPLE    NEGATIVE                       <5%  Ki-67          HIGH                           15%             14%  EGFR WT        POISTIVE         2+            60%  EGFR VIII      NEGATIVE                        0%               0% ______________________________________________________________________  GLIOMA FISH INTERPRETATION:  FISH BIOMARKER   INTERPRETATION     %ABNORMAL     FINDINGS  EGFR                                              GAIN  >5 COPY#          NORMAL              RATIO >3          NORMAL               >  2 COPY#          ABNORMAL           88%  MET                                               GAIN  >5 COPY#          NORMAL              RATIO >3          NORMAL              >2 COPY#          ABNORMAL           82%  7 CEP                                             GAIN  >5 COPY#          NORMAL              >2 COPY#          ABNORMAL           74%  PTEN               ABNORMAL           88%         LOSS 10 CEP             ABNORMAL           85%         LOSS  FISH INTERPRETATION SUMMARY: EGFR, 7 CEP, PTEN, AND 10 CEP:  4 OF 4 ABNORMAL MARKERS. EGFR, MET, AND CEP 7 EXHIBIT GAIN. PTEN AND 10 CEP EXHIBIT LOSS.  COMMENT: A MULTIVARIATE SURVIVAL ANALYSIS OF [facility] PATIENTS WITH GLIOBLASTOMAS REVEALS THAT THE HAZARD OF SHORT SURVIVAL AMONG PATIENTS WITH 0-2 ABNORMAL MARKERS (OUT OF 4) WAS SIGNIFICANTLY LOWER THAN THAT FOUND WITH PATIENTS WITH 4 ABNORMAL MARKERS EVEN AFTER ADJUSTMENT FOR AGE EFFECT.    Specimen  Tissue-Pathology -        Urinalysis No results found for this basename: colorurine,  appearanceur,  labspec,  phurine,  glucoseu,  hgbur,  bilirubinur,  ketonesur,  proteinur,  urobilinogen,  nitrite,  leukocytesur    RADIOGRAPHIC STUDIES: No results found.  ASSESSMENT:  #1. Glioblastoma multiforme left frontal lobe, no evidence of disease on most recent MRI. #2. Enrolled in the REGULATE -e trial at [facility], for vaccine therapy on 07/19/2014 #3. Chronic obstructive pulmonary disease, still smoking. #4. Seizure disorder, controlled on medication. #5. Allergic rhinitis, not symptomatic now. #6. Gout, quiescent. #7. Benign prostatic hypertrophy, on treatment with  Flomax. #8. Anxiety neurosis controlled with diazepam.   PLAN:  #1. Temodar 4-30 mg daily for a total of 5 days ending on 07/12/2014. #2. Continue oxycodone and Valium. #3. Followup at [facility] on 07/19/2014 with no appointment made here. He was told to call should he require any local assistance.    All questions were answered. The patient knows to call the clinic with any problems, questions or concerns. We can certainly see the patient much sooner if necessary.   I spent 25 minutes counseling the patient face to face. The  total time spent in the appointment was 30 minutes.    Doroteo Bradford, MD 07/11/2014 11:22 AM  DISCLAIMER:  This note was dictated with voice recognition software.  Similar sounding words can inadvertently be transcribed inaccurately and may not be corrected upon review.

## 2014-07-17 ENCOUNTER — Telehealth (HOSPITAL_COMMUNITY): Payer: Self-pay | Admitting: *Deleted

## 2014-07-17 ENCOUNTER — Other Ambulatory Visit (HOSPITAL_COMMUNITY): Payer: Self-pay | Admitting: Oncology

## 2014-07-17 DIAGNOSIS — C719 Malignant neoplasm of brain, unspecified: Secondary | ICD-10-CM

## 2014-07-17 MED ORDER — OXYCODONE HCL 5 MG PO CAPS: ORAL_CAPSULE | ORAL | Status: DC

## 2014-07-31 ENCOUNTER — Other Ambulatory Visit (HOSPITAL_COMMUNITY): Payer: Self-pay | Admitting: Hematology and Oncology

## 2014-07-31 DIAGNOSIS — C719 Malignant neoplasm of brain, unspecified: Secondary | ICD-10-CM

## 2014-07-31 MED ORDER — OXYCODONE HCL 5 MG PO CAPS: ORAL_CAPSULE | ORAL | Status: DC

## 2014-08-11 ENCOUNTER — Other Ambulatory Visit (HOSPITAL_COMMUNITY): Payer: Self-pay | Admitting: Hematology and Oncology

## 2014-08-11 ENCOUNTER — Telehealth (HOSPITAL_COMMUNITY): Payer: Self-pay | Admitting: Hematology and Oncology

## 2014-08-11 DIAGNOSIS — C719 Malignant neoplasm of brain, unspecified: Secondary | ICD-10-CM

## 2014-08-11 MED ORDER — OXYCODONE HCL 5 MG PO CAPS: ORAL_CAPSULE | ORAL | Status: DC

## 2014-08-11 MED ORDER — DIAZEPAM 10 MG PO TABS: ORAL_TABLET | ORAL | Status: DC

## 2014-08-25 ENCOUNTER — Telehealth (HOSPITAL_COMMUNITY): Payer: Self-pay | Admitting: *Deleted

## 2014-08-25 ENCOUNTER — Other Ambulatory Visit (HOSPITAL_COMMUNITY): Payer: Self-pay | Admitting: Oncology

## 2014-08-25 DIAGNOSIS — C719 Malignant neoplasm of brain, unspecified: Secondary | ICD-10-CM

## 2014-08-25 MED ORDER — OXYCODONE HCL 5 MG PO CAPS: ORAL_CAPSULE | ORAL | Status: DC

## 2014-08-25 NOTE — Telephone Encounter (Signed)
Ready

## 2014-09-07 ENCOUNTER — Other Ambulatory Visit (HOSPITAL_COMMUNITY): Payer: Self-pay | Admitting: Oncology

## 2014-09-07 ENCOUNTER — Encounter (HOSPITAL_COMMUNITY): Payer: Self-pay | Admitting: Oncology

## 2014-09-07 ENCOUNTER — Telehealth (HOSPITAL_COMMUNITY): Payer: Self-pay | Admitting: *Deleted

## 2014-09-07 DIAGNOSIS — F419 Anxiety disorder, unspecified: Secondary | ICD-10-CM

## 2014-09-07 HISTORY — DX: Anxiety disorder, unspecified: F41.9

## 2014-09-07 MED ORDER — DIAZEPAM 10 MG PO TABS: ORAL_TABLET | ORAL | Status: DC

## 2014-09-07 NOTE — Telephone Encounter (Signed)
Requested refill on valium, called to notify that the script has been sent to his pharmacy

## 2014-09-08 ENCOUNTER — Other Ambulatory Visit (HOSPITAL_COMMUNITY): Payer: Self-pay | Admitting: Oncology

## 2014-09-08 DIAGNOSIS — C719 Malignant neoplasm of brain, unspecified: Secondary | ICD-10-CM

## 2014-09-08 MED ORDER — OXYCODONE HCL 5 MG PO CAPS: ORAL_CAPSULE | ORAL | Status: DC

## 2014-09-08 NOTE — Telephone Encounter (Signed)
Ready for pick up

## 2014-09-18 ENCOUNTER — Other Ambulatory Visit (HOSPITAL_COMMUNITY): Payer: Self-pay | Admitting: Oncology

## 2014-09-18 DIAGNOSIS — C719 Malignant neoplasm of brain, unspecified: Secondary | ICD-10-CM

## 2014-09-18 MED ORDER — OXYCODONE HCL 5 MG PO CAPS: ORAL_CAPSULE | ORAL | Status: DC

## 2014-09-29 ENCOUNTER — Telehealth (HOSPITAL_COMMUNITY): Payer: Self-pay

## 2014-09-29 ENCOUNTER — Other Ambulatory Visit (HOSPITAL_COMMUNITY): Payer: Self-pay | Admitting: Oncology

## 2014-09-29 DIAGNOSIS — C719 Malignant neoplasm of brain, unspecified: Secondary | ICD-10-CM

## 2014-09-29 MED ORDER — OXYCODONE HCL 5 MG PO CAPS: ORAL_CAPSULE | ORAL | Status: DC

## 2014-10-09 ENCOUNTER — Encounter (HOSPITAL_COMMUNITY): Payer: Medicaid Other | Attending: Hematology and Oncology

## 2014-10-09 ENCOUNTER — Telehealth (HOSPITAL_COMMUNITY): Payer: Self-pay | Admitting: Hematology and Oncology

## 2014-10-09 ENCOUNTER — Encounter (HOSPITAL_COMMUNITY): Payer: Self-pay

## 2014-10-09 ENCOUNTER — Other Ambulatory Visit: Payer: Self-pay | Admitting: Radiology

## 2014-10-09 VITALS — BP 129/80 | HR 78 | Resp 18 | Wt 185.0 lb

## 2014-10-09 DIAGNOSIS — G4089 Other seizures: Secondary | ICD-10-CM

## 2014-10-09 DIAGNOSIS — M109 Gout, unspecified: Secondary | ICD-10-CM

## 2014-10-09 DIAGNOSIS — C711 Malignant neoplasm of frontal lobe: Secondary | ICD-10-CM | POA: Diagnosis not present

## 2014-10-09 DIAGNOSIS — C719 Malignant neoplasm of brain, unspecified: Secondary | ICD-10-CM | POA: Insufficient documentation

## 2014-10-09 DIAGNOSIS — J438 Other emphysema: Secondary | ICD-10-CM

## 2014-10-09 DIAGNOSIS — G40909 Epilepsy, unspecified, not intractable, without status epilepticus: Secondary | ICD-10-CM

## 2014-10-09 LAB — CBC WITH DIFFERENTIAL/PLATELET
Basophils Absolute: 0 10*3/uL (ref 0.0–0.1)
Basophils Relative: 0 % (ref 0–1)
Eosinophils Absolute: 0.1 10*3/uL (ref 0.0–0.7)
Eosinophils Relative: 1 % (ref 0–5)
HEMATOCRIT: 40.5 % (ref 39.0–52.0)
Hemoglobin: 14 g/dL (ref 13.0–17.0)
Lymphocytes Relative: 19 % (ref 12–46)
Lymphs Abs: 2 10*3/uL (ref 0.7–4.0)
MCH: 29.2 pg (ref 26.0–34.0)
MCHC: 34.6 g/dL (ref 30.0–36.0)
MCV: 84.6 fL (ref 78.0–100.0)
MONO ABS: 0.4 10*3/uL (ref 0.1–1.0)
MONOS PCT: 4 % (ref 3–12)
NEUTROS ABS: 8.2 10*3/uL — AB (ref 1.7–7.7)
NEUTROS PCT: 76 % (ref 43–77)
Platelets: 196 10*3/uL (ref 150–400)
RBC: 4.79 MIL/uL (ref 4.22–5.81)
RDW: 14.2 % (ref 11.5–15.5)
WBC: 10.7 10*3/uL — ABNORMAL HIGH (ref 4.0–10.5)

## 2014-10-09 LAB — COMPREHENSIVE METABOLIC PANEL
ALK PHOS: 72 U/L (ref 39–117)
ALT: 18 U/L (ref 0–53)
AST: 15 U/L (ref 0–37)
Albumin: 3.3 g/dL — ABNORMAL LOW (ref 3.5–5.2)
Anion gap: 14 (ref 5–15)
BUN: 22 mg/dL (ref 6–23)
CO2: 21 meq/L (ref 19–32)
Calcium: 8.7 mg/dL (ref 8.4–10.5)
Chloride: 104 mEq/L (ref 96–112)
Creatinine, Ser: 1.24 mg/dL (ref 0.50–1.35)
GFR, EST AFRICAN AMERICAN: 73 mL/min — AB (ref >90)
GFR, EST NON AFRICAN AMERICAN: 63 mL/min — AB (ref >90)
GLUCOSE: 95 mg/dL (ref 70–99)
POTASSIUM: 3.7 meq/L (ref 3.7–5.3)
SODIUM: 139 meq/L (ref 137–147)
Total Bilirubin: 0.2 mg/dL — ABNORMAL LOW (ref 0.3–1.2)
Total Protein: 6.8 g/dL (ref 6.0–8.3)

## 2014-10-09 LAB — LACTATE DEHYDROGENASE: LDH: 140 U/L (ref 94–250)

## 2014-10-09 NOTE — Progress Notes (Signed)
Cody Buchanan's reason for visit today is for labs as scheduled per MD orders.  Venipuncture performed with a 23 gauge butterfly needle to L Antecubital.  Cody Buchanan tolerated procedure well and without incident; questions were answered and patient was discharged.

## 2014-10-09 NOTE — Patient Instructions (Addendum)
[facility] Discharge Instructions  RECOMMENDATIONS MADE BY THE CONSULTANT AND ANY TEST RESULTS WILL BE SENT TO YOUR REFERRING PHYSICIAN.  Dexamethasone tapper dose.  Decrease 1 mg per week.  4 mg this week, next week 2 take 3mg , the next week 3 take 2mg , and then next week 4 take 1 mg.  Then the next week you stop. Will set up to have a port placed.  You will start treatment this Wednesday.  Hildred Alamin will call you tomorrow to talk to you about the treatment and then Wednesday she will meet you face to face.  EXAM FINDINGS BY THE PHYSICIAN TODAY AND SIGNS OR SYMPTOMS TO REPORT TO CLINIC OR PRIMARY PHYSICIAN:  Hildred Alamin to call you tomorrow to go over chemo teaching regarding Irinotecan. But for now we are going to include a short teaching summary of the drug Irinotecan and the major side effects. Hildred Alamin to make a chemo calendar for you so you know the days and times of your future chemo appointments. Hildred Alamin to sit down with you again on Wednesday to go over more teaching.  Irinotecan is a chemotherapy drug primarily used to treat colon and rectal cancer. This drug like other chemo can cause lowering of blood counts, increased risk of infection, bleeding or bruising more easily, fatigue, hair loss, mouth sores, stomach cramps, diarrhea, and nausea with vomiting. The main symptoms that we look for with this drug are stomach cramps and diarrhea. If you develop diarrhea we want you to take 2 Imodium after the first loose stool then take 1 Imodium every 2 hrs until you go 12 hrs without diarrhea. It is very important that you drink 6-8 glasses of fluids daily while going through chemo especially if you are having diarrhea. We do not want you to get dehydrated. If the Imodium does not stop diarrhea within 12 hrs please call us at [phone]. There is a prescription strength anti-diarrhea drug, Lomotil, that we can call in for you.  If this occurs on the weekend report to the ED.  Avastin - this  medication is considered an anti-angiogenic therapy. Avastin is thought to work by causing the blood vessels to shrink away from the tumor, blocking the supply of oxygen and nutrients that the tumor needs to grow. Avastin may also cause the existing blood vessels to change in ways that help the chemotherapy reach the tumor more effectively. Avastin may also work to interfere with the growth of new blood vessels, causing the tumor to starve. Side Effects: Nosebleeds, High Blood Pressure, Protein in Urine, & diarrhea.    ------------------------------------------------------------------------------------------------------------------------------------------------------------------------------------------- Thank you for choosing [facility] to provide your oncology and hematology care.  To afford each patient quality time with our providers, please arrive at least 15 minutes before your scheduled appointment time.  With your help, our goal is to use those 15 minutes to complete the necessary work-up to ensure our physicians have the information they need to help with your evaluation and healthcare recommendations.    Effective January 1st, 2014, we ask that you re-schedule your appointment with our physicians should you arrive 10 or more minutes late for your appointment.  We strive to give you quality time with our providers, and arriving late affects you and other patients whose appointments are after yours.    Again, thank you for choosing [facility].  Our hope is that these requests will decrease the amount of time that you wait before being seen by  our physicians.       _____________________________________________________________  Should you have questions after your visit to [facility], please contact our office at [phone] between the hours of 8:30 a.m. and 5:00 p.m.  Voicemails left after 4:30 p.m. will not be returned until the following business day.   For prescription refill requests, have your pharmacy contact our office with your prescription refill request.    Irinotecan injection What is this medicine? IRINOTECAN (ir in oh TEE kan ) is a chemotherapy drug. It is used to treat colon and rectal cancer. This medicine may be used for other purposes; ask your health care provider or pharmacist if you have questions. COMMON BRAND NAME(S): Camptosar What should I tell my health care provider before I take this medicine? They need to know if you have any of these conditions: -blood disorders -dehydration -diarrhea -infection (especially a virus infection such as chickenpox, cold sores, or herpes) -liver disease -low blood counts, like low white cell, platelet, or red cell counts -recent or ongoing radiation therapy -an unusual or allergic reaction to irinotecan, sorbitol, other chemotherapy, other medicines, foods, dyes, or preservatives -pregnant or trying to get pregnant -breast-feeding How should I use this medicine? This drug is given as an infusion into a vein. It is administered in a hospital or clinic by a specially trained health care professional. Talk to your pediatrician regarding the use of this medicine in children. Special care may be needed. Overdosage: If you think you have taken too much of this medicine contact a poison control center or emergency room at once. NOTE: This medicine is only for you. Do not share this medicine with others. What if I miss a dose? It is important not to miss your dose. Call your doctor or health care professional if you are unable to keep an appointment. What may interact with this medicine? Do not take this medicine with any of the following medications: -atazanavir -certain medicines for fungal infections like itraconazole and ketoconazole -St. John's Wort This medicine may also interact with the following medications: -dexamethasone -diuretics -laxatives -medicines for seizures like  carbamazepine, mephobarbital, phenobarbital, phenytoin, primidone -medicines to increase blood counts like filgrastim, pegfilgrastim, sargramostim -prochlorperazine -vaccines This list may not describe all possible interactions. Give your health care provider a list of all the medicines, herbs, non-prescription drugs, or dietary supplements you use. Also tell them if you smoke, drink alcohol, or use illegal drugs. Some items may interact with your medicine. What should I watch for while using this medicine? Your condition will be monitored carefully while you are receiving this medicine. You will need important blood work done while you are taking this medicine. This drug may make you feel generally unwell. This is not uncommon, as chemotherapy can affect healthy cells as well as cancer cells. Report any side effects. Continue your course of treatment even though you feel ill unless your doctor tells you to stop. In some cases, you may be given additional medicines to help with side effects. Follow all directions for their use. You may get drowsy or dizzy. Do not drive, use machinery, or do anything that needs mental alertness until you know how this medicine affects you. Do not stand or sit up quickly, especially if you are an older patient. This reduces the risk of dizzy or fainting spells. Call your doctor or health care professional for advice if you get a fever, chills or sore throat, or other symptoms of a cold or flu. Do  not treat yourself. This drug decreases your body's ability to fight infections. Try to avoid being around people who are sick. This medicine may increase your risk to bruise or bleed. Call your doctor or health care professional if you notice any unusual bleeding. Be careful brushing and flossing your teeth or using a toothpick because you may get an infection or bleed more easily. If you have any dental work done, tell your dentist you are receiving this medicine. Avoid taking  products that contain aspirin, acetaminophen, ibuprofen, naproxen, or ketoprofen unless instructed by your doctor. These medicines may hide a fever. Do not become pregnant while taking this medicine. Women should inform their doctor if they wish to become pregnant or think they might be pregnant. There is a potential for serious side effects to an unborn child. Talk to your health care professional or pharmacist for more information. Do not breast-feed an infant while taking this medicine. What side effects may I notice from receiving this medicine? Side effects that you should report to your doctor or health care professional as soon as possible: -allergic reactions like skin rash, itching or hives, swelling of the face, lips, or tongue -low blood counts - this medicine may decrease the number of white blood cells, red blood cells and platelets. You may be at increased risk for infections and bleeding. -signs of infection - fever or chills, cough, sore throat, pain or difficulty passing urine -signs of decreased platelets or bleeding - bruising, pinpoint red spots on the skin, black, tarry stools, blood in the urine -signs of decreased red blood cells - unusually weak or tired, fainting spells, lightheadedness -breathing problems -chest pain -diarrhea -feeling faint or lightheaded, falls -flushing, runny nose, sweating during infusion -mouth sores or pain -pain, swelling, redness or irritation where injected -pain, swelling, warmth in the leg -pain, tingling, numbness in the hands or feet -problems with balance, talking, walking -stomach cramps, pain -trouble passing urine or change in the amount of urine -vomiting as to be unable to hold down drinks or food -yellowing of the eyes or skin Side effects that usually do not require medical attention (report to your doctor or health care professional if they continue or are bothersome): -constipation -hair loss -headache -loss of  appetite -nausea, vomiting -stomach upset This list may not describe all possible side effects. Call your doctor for medical advice about side effects. You may report side effects to FDA at 1-800-FDA-1088. Where should I keep my medicine? This drug is given in a hospital or clinic and will not be stored at home. NOTE: This sheet is a summary. It may not cover all possible information. If you have questions about this medicine, talk to your doctor, pharmacist, or health care provider.  2015, [employer] Standard. (2013-06-13 16:29:32) Loperamide tablets or capsules What is this medicine? LOPERAMIDE (loe PER a mide) is used to treat diarrhea. This medicine may be used for other purposes; ask your health care provider or pharmacist if you have questions. COMMON BRAND NAME(S): Anti-Diarrheal, Imodium A-D, K-Pek II What should I tell my health care provider before I take this medicine? They need to know if you have any of these conditions: -a black or bloody stool -bacterial food poisoning -colitis or mucus in your stool -currently taking an antibiotic medication for an infection -fever -liver disease -severe abdominal pain, swelling or bulging -an unusual or allergic reaction to loperamide, other medicines, foods, dyes, or preservatives -pregnant or trying to get pregnant -breast-feeding How should I use  this medicine? Take this medicine by mouth with a glass of water. Follow the directions on the prescription label. Take your doses at regular intervals. Do not take your medicine more often than directed. Talk to your pediatrician regarding the use of this medicine in children. Special care may be needed. Overdosage: If you think you have taken too much of this medicine contact a poison control center or emergency room at once. NOTE: This medicine is only for you. Do not share this medicine with others. What if I miss a dose? This does not apply. This medicine is not for regular use.  Only take this medicine while you continue to have loose bowel movements. Do not take more medicine than recommended by the packaging label or by your healthcare professional. What may interact with this medicine? Do not take this medicine with any of the following medications: -alosetron This medicine may also interact with the following medications: -quinidine -ritonavir -saquinavir This list may not describe all possible interactions. Give your health care provider a list of all the medicines, herbs, non-prescription drugs, or dietary supplements you use. Also tell them if you smoke, drink alcohol, or use illegal drugs. Some items may interact with your medicine. What should I watch for while using this medicine? Do not take this medicine for more than 1 week without asking your doctor or health care professional. If your symptoms do not start to get better after two days, you may have a problem that needs further evaluation. Check with your doctor or health care professional right away if you develop a fever, severe abdominal pain, swelling or bulging, or if you have have bloody/black diarrhea or stools. You may get drowsy or dizzy. Do not drive, use machinery, or do anything that needs mental alertness until you know how this medicine affects you. Do not stand or sit up quickly, especially if you are an older patient. This reduces the risk of dizzy or fainting spells. Alcohol can increase possible drowsiness and dizziness. Avoid alcoholic drinks. Your mouth may get dry. Chewing sugarless gum or sucking hard candy, and drinking plenty of water may help. Contact your doctor if the problem does not go away or is severe. Drinking plenty of water can also help prevent dehydration that can occur with diarrhea. Elderly patients may have a more variable response to the effects of this medicine, and are more susceptible to the effects of dehydration. What side effects may I notice from receiving this  medicine? Side effects that you should report to your doctor or health care professional as soon as possible: -allergic reactions like skin rash, itching or hives, swelling of the face, lips, or tongue -bloated, swollen feeling in your abdomen -blurred vision -loss of appetite -stomach pain Side effects that usually do not require medical attention (report to your doctor or health care professional if they continue or are bothersome): -constipation -drowsiness or dizziness -dry mouth -nausea, vomiting This list may not describe all possible side effects. Call your doctor for medical advice about side effects. You may report side effects to FDA at 1-800-FDA-1088. Where should I keep my medicine? Keep out of the reach of children. Store at room temperature between 15 and 25 degrees C (59 and 77 degrees F). Keep container tightly closed. Throw away any unused medicine after the expiration date. NOTE: This sheet is a summary. It may not cover all possible information. If you have questions about this medicine, talk to your doctor, pharmacist, or health care provider.  2015, Elsevier/Gold Standard. (2008-06-20 16:02:13) Bevacizumab injection What is this medicine? BEVACIZUMAB (be va SIZ yoo mab) is a chemotherapy drug. It targets a protein found in many cancer cell types, and halts cancer growth. This drug treats many cancers including non-small cell lung cancer, ovarian cancer, cervical cancer, and colon or rectal cancer. It is usually given with other chemotherapy drugs. This medicine may be used for other purposes; ask your health care provider or pharmacist if you have questions. COMMON BRAND NAME(S): Avastin What should I tell my health care provider before I take this medicine? They need to know if you have any of these conditions: -blood clots -heart disease, including heart failure, heart attack, or chest pain (angina) -high blood pressure -infection (especially a virus infection such  as chickenpox, cold sores, or herpes) -kidney disease -lung disease -prior chemotherapy with doxorubicin, daunorubicin, epirubicin, or other anthracycline type chemotherapy agents -recent or ongoing radiation therapy -recent surgery -stroke -an unusual or allergic reaction to bevacizumab, hamster proteins, mouse proteins, other medicines, foods, dyes, or preservatives -pregnant or trying to get pregnant -breast-feeding How should I use this medicine? This medicine is for infusion into a vein. It is given by a health care professional in a hospital or clinic setting. Talk to your pediatrician regarding the use of this medicine in children. Special care may be needed. Overdosage: If you think you have taken too much of this medicine contact a poison control center or emergency room at once. NOTE: This medicine is only for you. Do not share this medicine with others. What if I miss a dose? It is important not to miss your dose. Call your doctor or health care professional if you are unable to keep an appointment. What may interact with this medicine? Interactions are not expected. This list may not describe all possible interactions. Give your health care provider a list of all the medicines, herbs, non-prescription drugs, or dietary supplements you use. Also tell them if you smoke, drink alcohol, or use illegal drugs. Some items may interact with your medicine. What should I watch for while using this medicine? Your condition will be monitored carefully while you are receiving this medicine. You will need important blood work and urine testing done while you are taking this medicine. During your treatment, let your health care professional know if you have any unusual symptoms, such as difficulty breathing. This medicine may rarely cause 'gastrointestinal perforation' (holes in the stomach, intestines or colon), a serious side effect requiring surgery to repair. This medicine should be started  at least 28 days following major surgery and the site of the surgery should be totally healed. Check with your doctor before scheduling dental work or surgery while you are receiving this treatment. Talk to your doctor if you have recently had surgery or if you have a wound that has not healed. Do not become pregnant while taking this medicine. Women should inform their doctor if they wish to become pregnant or think they might be pregnant. There is a potential for serious side effects to an unborn child. Talk to your health care professional or pharmacist for more information. Do not breast-feed an infant while taking this medicine. This medicine has caused ovarian failure in some women. This medicine may interfere with the ability to have a child. You should talk to your doctor or health care professional if you are concerned about your fertility. What side effects may I notice from receiving this medicine? Side effects that you should report to  your doctor or health care professional as soon as possible: -allergic reactions like skin rash, itching or hives, swelling of the face, lips, or tongue -signs of infection - fever or chills, cough, sore throat, pain or trouble passing urine -signs of decreased platelets or bleeding - bruising, pinpoint red spots on the skin, black, tarry stools, nosebleeds, blood in the urine -breathing problems -changes in vision -chest pain -confusion -jaw pain, especially after dental work -mouth sores -seizures -severe abdominal pain -severe headache -sudden numbness or weakness of the face, arm or leg -swelling of legs or ankles -symptoms of a stroke: change in mental awareness, inability to talk or move one side of the body (especially in patients with lung cancer) -trouble passing urine or change in the amount of urine -trouble speaking or understanding -trouble walking, dizziness, loss of balance or coordination Side effects that usually do not require  medical attention (report to your doctor or health care professional if they continue or are bothersome): -constipation -diarrhea -dry skin -headache -loss of appetite -nausea, vomiting This list may not describe all possible side effects. Call your doctor for medical advice about side effects. You may report side effects to FDA at 1-800-FDA-1088. Where should I keep my medicine? This drug is given in a hospital or clinic and will not be stored at home. NOTE: This sheet is a summary. It may not cover all possible information. If you have questions about this medicine, talk to your doctor, pharmacist, or health care provider.  2015, [employer]vier/[employer] Standard. (2013-11-15 11:38:34)

## 2014-10-09 NOTE — Progress Notes (Signed)
[facility]  OFFICE PROGRESS NOTE  [name], MD [place] [address]  DIAGNOSIS: Malignant brain tumor - Plan: CBC with Differential, Comprehensive metabolic panel, Lactate dehydrogenase, CBC with Differential, Comprehensive metabolic panel, Lactate dehydrogenase, IR Fluoro Guide CV Line Right, CANCELED: IR Fluoro Guide CV Midline PICC Left  Seizure disorder  Other emphysema  No chief complaint on file.   CURRENT THERAPY: Temodar for 5 days every 28 days x2 cycles plus lymphocyte vaccine x2 doses at [facility] per REGULATEe protocol. Repeat MRI brain on 08/23/2014 showed progression of disease. He was started on Avastin 10 mg per kilogram on 09/27/2014 with plans to continue Avastin every 2 weeks together with irinotecan 125 mg per meter squared every 2 weeks.  INTERVAL HISTORY: Cody Buchanan 58 y.o. male returns for followup after completion of combined modality therapy with low-dose Temodar plus radiation completed on 06/20/2014 for glioblastoma multiforme, status post resection with residual disease seen on MRI done on 04/19/2014. He was seen at [facility] on 07/05/2014 and will now enter into the next phase of his clinical trial taking Temodar 430 mg daily for 5 days every 28 days started on 07/07/2014 with administration of vaccine prepared from his own lymphocytes according to the protocol schedule at [facility].  His MRI done on 07/05/2014 showed improvement with no evidence of disease and filling in of the surgical cavity. He develop headache with inability to speak with outflow-and with nausea but no vomiting which have been worsening for one week before being evaluated at [facility]. He was given dexamethasone intravenously 20 mg along with Avastin 10 mg per kilogram and will start on 4 mg twice a day of dexamethasone with Pepcid with a tapering protocol. Symptoms have improved dramatically. Appetite is now good with no nausea,  vomiting, headache, or weakness, or significant lower extremities swelling or redness. He also denies a sore mouth, skin rash, or seizures.    MEDICAL HISTORY: Past Medical History  Diagnosis Date  . Glioblastoma   . GERD (gastroesophageal reflux disease)   . Seizures   . Anxiety 09/07/2014    INTERIM HISTORY: has Malignant brain tumor, left frontal glioblastoma multiforme grade 4; Chronic obstructive pulmonary disease; Seizure disorder; Allergic rhinitis; Acute gouty arthritis; and Anxiety on his problem list.    ALLERGIES:  has No Known Allergies.  MEDICATIONS: has a current medication list which includes the following prescription(s): acetaminophen, albuterol, bisacodyl, diazepam, ibuprofen, levetiracetam, methylprednisolone, multivitamin with minerals, ondansetron, ondansetron, oxycodone, tamsulosin, temozolomide, temozolomide, and tiotropium.  SURGICAL HISTORY:  Past Surgical History  Procedure Laterality Date  . Salivary gland surgery    . Brain tumor excision      FAMILY HISTORY: family history includes Cancer in his mother; Diabetes in his father and mother; Hypertension in his mother; Stroke in his mother.  SOCIAL HISTORY:  reports that he has been smoking Cigarettes.  He has been smoking about 0.00 packs per day. He has never used smokeless tobacco. He reports that he does not drink alcohol or use illicit drugs.  REVIEW OF SYSTEMS:  Other than that discussed above is noncontributory.  PHYSICAL EXAMINATION: ECOG PERFORMANCE STATUS: 1 - Symptomatic but completely ambulatory  Blood pressure 129/80, pulse 78, resp. rate 18, weight 185 lb (83.915 kg), SpO2 99.00%.  GENERAL:alert, no distress and comfortable SKIN: skin color, texture, turgor are normal, no rashes or significant lesions. Previous surgical wound on the scalp are well-healed. EYES: PERLA; Conjunctiva  are pink and non-injected, sclera clear SINUSES: No redness or tenderness over maxillary or ethmoid  sinuses OROPHARYNX:no exudate, no erythema on lips, buccal mucosa, or tongue. NECK: supple, thyroid normal size, non-tender, without nodularity. No masses CHEST: Increased AP diameter with no breast masses. LYMPH:  no palpable lymphadenopathy in the cervical, axillary or inguinal LUNGS: clear to auscultation and percussion with normal breathing effort HEART: regular rate & rhythm and no murmurs. ABDOMEN:abdomen soft, non-tender and normal bowel sounds MUSCULOSKELETAL:no cyanosis of digits and no clubbing. Range of motion normal.  NEURO: alert & oriented x 3 with fluent speech, no focal motor/sensory deficits   LABORATORY DATA: Office Visit on 10/09/2014  Component Date Value Ref Range Status  . WBC 10/09/2014 10.7* 4.0 - 10.5 K/uL Final  . RBC 10/09/2014 4.79  4.22 - 5.81 MIL/uL Final  . Hemoglobin 10/09/2014 14.0  13.0 - 17.0 g/dL Final  . HCT 10/09/2014 40.5  39.0 - 52.0 % Final  . MCV 10/09/2014 84.6  78.0 - 100.0 fL Final  . MCH 10/09/2014 29.2  26.0 - 34.0 pg Final  . MCHC 10/09/2014 34.6  30.0 - 36.0 g/dL Final  . RDW 10/09/2014 14.2  11.5 - 15.5 % Final  . Platelets 10/09/2014 196  150 - 400 K/uL Final   PLATELET CLUMPS NOTED ON SMEAR, COUNT APPEARS ADEQUATE  . Neutrophils Relative % 10/09/2014 76  43 - 77 % Final  . Neutro Abs 10/09/2014 8.2* 1.7 - 7.7 K/uL Final  . Lymphocytes Relative 10/09/2014 19  12 - 46 % Final  . Lymphs Abs 10/09/2014 2.0  0.7 - 4.0 K/uL Final  . Monocytes Relative 10/09/2014 4  3 - 12 % Final  . Monocytes Absolute 10/09/2014 0.4  0.1 - 1.0 K/uL Final  . Eosinophils Relative 10/09/2014 1  0 - 5 % Final  . Eosinophils Absolute 10/09/2014 0.1  0.0 - 0.7 K/uL Final  . Basophils Relative 10/09/2014 0  0 - 1 % Final  . Basophils Absolute 10/09/2014 0.0  0.0 - 0.1 K/uL Final  . WBC Morphology 10/09/2014 SMUDGE CELLS   Final   ATYPICAL LYMPHOCYTES  . Sodium 10/09/2014 139  137 - 147 mEq/L Final  . Potassium 10/09/2014 3.7  3.7 - 5.3 mEq/L Final  .  Chloride 10/09/2014 104  96 - 112 mEq/L Final  . CO2 10/09/2014 21  19 - 32 mEq/L Final  . Glucose, Bld 10/09/2014 95  70 - 99 mg/dL Final  . BUN 10/09/2014 22  6 - 23 mg/dL Final  . Creatinine, Ser 10/09/2014 1.24  0.50 - 1.35 mg/dL Final  . Calcium 10/09/2014 8.7  8.4 - 10.5 mg/dL Final  . Total Protein 10/09/2014 6.8  6.0 - 8.3 g/dL Final  . Albumin 10/09/2014 3.3* 3.5 - 5.2 g/dL Final  . AST 10/09/2014 15  0 - 37 U/L Final  . ALT 10/09/2014 18  0 - 53 U/L Final  . Alkaline Phosphatase 10/09/2014 72  39 - 117 U/L Final  . Total Bilirubin 10/09/2014 0.2* 0.3 - 1.2 mg/dL Final  . GFR calc non Af Amer 10/09/2014 63* >90 mL/min Final  . GFR calc Af Amer 10/09/2014 73* >90 mL/min Final   Comment: (NOTE)                          The eGFR has been calculated using the CKD EPI equation.  This calculation has not been validated in all clinical situations.                          eGFR's persistently <90 mL/min signify possible Chronic Kidney                          Disease.  . Anion gap 10/09/2014 14  5 - 15 Final  . LDH 10/09/2014 140  94 - 250 U/L Final   SLIGHT HEMOLYSIS    PATHOLOGY: Glioblastoma multiforme  Urinalysis No results found for this basename: colorurine,  appearanceur,  labspec,  phurine,  glucoseu,  hgbur,  bilirubinur,  ketonesur,  proteinur,  urobilinogen,  nitrite,  leukocytesur    RADIOGRAPHIC STUDIES:   ASSESSMENT:  #1. Recurrent glioblastoma multiforme left frontal lobe, status post Avastin on 09/27/2014 with plans to deliver Avastin plus Irinotecan every 2 weeks, next dose Avastin due on 10/11/2014. #2. Chronic obstructive pulmonary disease, still smoking. #3. Seizure disorder, on treatment. #4. Gout, controlled. #5. Benign prostatic hypertrophy, on treatment with Flomax. #6. Anxiety neurosis, controlled with diazepam.    PLAN:  #1. Taper dexamethasone by 1 mg daily each week. #2. Continue oxycodone and Valium. #3. Chemotherapy  teaching. #4. Life port insertion. #5. IV Avastin plus irinotecan on 10/11/2014 #6. Repeat brain MRI prior to next visit at [place] at the end of November 2015. #7. Followup on 10/25/2014 at which time third cycle of Avastin and second cycle of Avastin plus Irinotecan will be given.   All questions were answered. The patient knows to call the clinic with any problems, questions or concerns. We can certainly see the patient much sooner if necessary.   I spent 40 minutes counseling the patient face to face. The total time spent in the appointment was 55 minutes.    Doroteo Bradford, MD 10/10/2014 7:51 AM  DISCLAIMER:  This note was dictated with voice recognition software.  Similar sounding words can inadvertently be transcribed inaccurately and may not be corrected upon review.

## 2014-10-10 ENCOUNTER — Other Ambulatory Visit (HOSPITAL_COMMUNITY): Payer: Self-pay | Admitting: Hematology and Oncology

## 2014-10-10 ENCOUNTER — Encounter (HOSPITAL_COMMUNITY): Payer: Self-pay

## 2014-10-10 ENCOUNTER — Ambulatory Visit (HOSPITAL_COMMUNITY)
Admission: RE | Admit: 2014-10-10 | Discharge: 2014-10-10 | Disposition: A | Discharge status: Home / Self Care | Payer: Medicaid Other | Source: Ambulatory Visit | Attending: Hematology and Oncology | Admitting: Hematology and Oncology

## 2014-10-10 ENCOUNTER — Telehealth (HOSPITAL_COMMUNITY): Payer: Self-pay | Admitting: *Deleted

## 2014-10-10 DIAGNOSIS — C719 Malignant neoplasm of brain, unspecified: Secondary | ICD-10-CM

## 2014-10-10 DIAGNOSIS — F419 Anxiety disorder, unspecified: Secondary | ICD-10-CM

## 2014-10-10 DIAGNOSIS — G40909 Epilepsy, unspecified, not intractable, without status epilepticus: Secondary | ICD-10-CM | POA: Insufficient documentation

## 2014-10-10 DIAGNOSIS — Z452 Encounter for adjustment and management of vascular access device: Secondary | ICD-10-CM | POA: Diagnosis present

## 2014-10-10 DIAGNOSIS — J449 Chronic obstructive pulmonary disease, unspecified: Secondary | ICD-10-CM | POA: Insufficient documentation

## 2014-10-10 DIAGNOSIS — Z7952 Long term (current) use of systemic steroids: Secondary | ICD-10-CM | POA: Diagnosis not present

## 2014-10-10 DIAGNOSIS — F17210 Nicotine dependence, cigarettes, uncomplicated: Secondary | ICD-10-CM | POA: Diagnosis not present

## 2014-10-10 DIAGNOSIS — Z79891 Long term (current) use of opiate analgesic: Secondary | ICD-10-CM | POA: Insufficient documentation

## 2014-10-10 DIAGNOSIS — Z79899 Other long term (current) drug therapy: Secondary | ICD-10-CM | POA: Insufficient documentation

## 2014-10-10 DIAGNOSIS — C711 Malignant neoplasm of frontal lobe: Secondary | ICD-10-CM | POA: Insufficient documentation

## 2014-10-10 LAB — CBC WITH DIFFERENTIAL/PLATELET
BASOS ABS: 0 10*3/uL (ref 0.0–0.1)
Basophils Relative: 0 % (ref 0–1)
EOS PCT: 1 % (ref 0–5)
Eosinophils Absolute: 0.1 10*3/uL (ref 0.0–0.7)
HCT: 45.7 % (ref 39.0–52.0)
Hemoglobin: 15.8 g/dL (ref 13.0–17.0)
LYMPHS ABS: 1.4 10*3/uL (ref 0.7–4.0)
LYMPHS PCT: 13 % (ref 12–46)
MCH: 29.8 pg (ref 26.0–34.0)
MCHC: 34.6 g/dL (ref 30.0–36.0)
MCV: 86.1 fL (ref 78.0–100.0)
MONO ABS: 0.5 10*3/uL (ref 0.1–1.0)
Monocytes Relative: 4 % (ref 3–12)
NEUTROS ABS: 9.2 10*3/uL — AB (ref 1.7–7.7)
Neutrophils Relative %: 82 % — ABNORMAL HIGH (ref 43–77)
Platelets: 183 10*3/uL (ref 150–400)
RBC: 5.31 MIL/uL (ref 4.22–5.81)
RDW: 14.3 % (ref 11.5–15.5)
WBC: 11.2 10*3/uL — AB (ref 4.0–10.5)

## 2014-10-10 LAB — PROTIME-INR
INR: 0.99 (ref 0.00–1.49)
Prothrombin Time: 13.2 seconds (ref 11.6–15.2)

## 2014-10-10 MED ORDER — MIDAZOLAM HCL 2 MG/2ML IJ SOLN
INTRAMUSCULAR | Status: AC
  Filled 2014-10-10: qty 4

## 2014-10-10 MED ORDER — CEFAZOLIN SODIUM-DEXTROSE 2-3 GM-% IV SOLR
INTRAVENOUS | Status: AC
  Filled 2014-10-10: qty 50

## 2014-10-10 MED ORDER — CEFAZOLIN SODIUM-DEXTROSE 2-3 GM-% IV SOLR
2.0000 g | Freq: Once | INTRAVENOUS | Status: AC
  Administered 2014-10-10: 2 g via INTRAVENOUS

## 2014-10-10 MED ORDER — SODIUM CHLORIDE 0.9 % IV SOLN
INTRAVENOUS | Status: DC
  Administered 2014-10-10: 10:00:00 via INTRAVENOUS

## 2014-10-10 MED ORDER — MIDAZOLAM HCL 2 MG/2ML IJ SOLN
INTRAMUSCULAR | Status: AC | PRN
  Administered 2014-10-10: 2 mg via INTRAVENOUS
  Administered 2014-10-10 (×2): 1 mg via INTRAVENOUS

## 2014-10-10 MED ORDER — DIAZEPAM 10 MG PO TABS: ORAL_TABLET | ORAL | Status: DC

## 2014-10-10 MED ORDER — FENTANYL CITRATE 0.05 MG/ML IJ SOLN
INTRAMUSCULAR | Status: AC
  Filled 2014-10-10: qty 4

## 2014-10-10 MED ORDER — HEPARIN SOD (PORK) LOCK FLUSH 100 UNIT/ML IV SOLN
500.0000 [IU] | Freq: Once | INTRAVENOUS | Status: AC
  Administered 2014-10-10: 500 [IU] via INTRAVENOUS

## 2014-10-10 MED ORDER — HEPARIN SOD (PORK) LOCK FLUSH 100 UNIT/ML IV SOLN
INTRAVENOUS | Status: AC
  Filled 2014-10-10: qty 5

## 2014-10-10 MED ORDER — LOPERAMIDE HCL 2 MG PO CAPS: ORAL_CAPSULE | ORAL | Status: DC

## 2014-10-10 MED ORDER — LIDOCAINE-PRILOCAINE 2.5-2.5 % EX CREA
TOPICAL_CREAM | CUTANEOUS | Status: AC
Start: 2014-10-10 — End: ?

## 2014-10-10 MED ORDER — FENTANYL CITRATE 0.05 MG/ML IJ SOLN
INTRAMUSCULAR | Status: AC | PRN
  Administered 2014-10-10: 100 ug via INTRAVENOUS

## 2014-10-10 MED ORDER — LIDOCAINE HCL 1 % IJ SOLN
INTRAMUSCULAR | Status: AC
  Filled 2014-10-10: qty 20

## 2014-10-10 NOTE — Procedures (Signed)
Procedure:  Right porta-cath placement Access:  Right IJ vein SL Power Port placed with tip at cavoatrial junction.  OK to use.  No PTX.

## 2014-10-10 NOTE — Patient Instructions (Addendum)
[facility]   CHEMOTHERAPY INSTRUCTIONS  Premeds: Zofran IV (for nausea/vomiting prevention/reduction). Dexamethasone IV (steroid) given to decrease your risk of having nausea, allergic reaction to chemo, and to generally make you feel better. Dexamethasone can cause you to feel nervous, anxious, jittery, have trouble sleeping, and make you feel flushed or look red in the face, neck, shoulder areas. If you develop these side effects, they will pass as the medication wears off.   Post meds: You will also receive Atropine through your IV after receiving the Irinotecan. The Atropine is being given to reduce the risk of/severity of you having loose stools/diarrhea from the Irinotecan chemotherapy.   Irinotecan is a chemotherapy drug primarily used to treat colon and rectal cancer. This drug like other chemo can cause lowering of blood counts, increased risk of infection, bleeding or bruising more easily, fatigue, hair loss, mouth sores, stomach cramps, diarrhea, and nausea with vomiting. The main symptoms that we look for with this drug are stomach cramps and diarrhea. If you develop diarrhea we want you to take 2 Imodium after the first loose stool then take 1 Imodium every 2 hours until you go 12 hours without diarrhea/loose stools. It is very important that you drink 6-8 glasses of fluids daily while going through chemo especially if you are having diarrhea. We do not want you to get dehydrated. If the Imodium does not stop diarrhea within 12 hrs please call us at [phone]. If this occurs on the weekend Go to the Emergency Room. (this drug takes 90 minutes to infuse)  Avastin - this medication is considered an anti-angiogenic therapy. Avastin is thought to work by causing the blood vessels to shrink away from the tumor, blocking the supply of oxygen and nutrients that the tumor needs to grow. Avastin may also cause the existing blood vessels to change in ways that help the  chemotherapy reach the tumor more effectively. Avastin may also work to interfere with the growth of new blood vessels, causing the tumor to starve. Side Effects: Nosebleeds, High Blood Pressure, Protein in Urine, & diarrhea.     POTENTIAL SIDE EFFECTS OF TREATMENT: Increased Susceptibility to Infection, Vomiting, Constipation, Hair Thinning, Changes in Character of Skin and Nails (brittleness, dryness,etc.), Pigment Changes (darkening of veins, nail beds, palms of hands, soles of feet, etc.), Bone Marrow Suppression, Abdominal Cramping, Urinary Frequency, Blood in Urine, Complete Hair Loss, Nausea, Diarrhea, Painful Urination, Pus in Urine, Sun Sensitivity and Mouth Sores   SELF IMAGE NEEDS AND REFERRALS MADE: Obtain hair accessories as soon as possible (hats)   EDUCATIONAL MATERIALS GIVEN AND REVIEWED: Chemotherapy and You booklet Specific Instructions Sheets: Irinotecan, Avastin, Dexamethasone, Zofran, Atropine, EMLA cream, Imodium, Zofran   SELF CARE ACTIVITIES WHILE ON CHEMOTHERAPY: Increase your fluid intake 48 hours prior to treatment and drink at least 2 quarts per day after treatment., No alcohol intake., No aspirin or other medications unless approved by your oncologist., Eat foods that are light and easy to digest., Eat foods at cold or room temperature., No fried, fatty, or spicy foods immediately before or after treatment., Have teeth cleaned professionally before starting treatment. Keep dentures and partial plates clean., Use soft toothbrush and do not use mouthwashes that contain alcohol. Biotene is a mouthwash that is alcohol free. Use warm salt water gargles (1 teaspoon salt per 1 quart warm water) before and after meals and at bedtime. Or you may rinse with 2 tablespoons of three -percent hydrogen peroxide mixed in eight ounces  of water., Always use sunscreen with SPF (Sun Protection Factor) of 30 or higher., Use your nausea medication as directed to prevent nausea., Use your  stool softener or laxative as directed to prevent constipation. and Use your anti-diarrheal medication as directed to stop diarrhea.  Please wash your hands for at least 30 seconds using warm soapy water. Handwashing is the #1 way to prevent the spread of germs. Stay away from sick people or people who are getting over a cold. If you develop respiratory systems such as green/yellow mucus production or productive cough or persistent cough let us know and we will see if you need an antibiotic. It is a good idea to keep a pair of gloves on when going into grocery stores/[employer] to decrease your risk of coming into contact with germs on the carts, etc. Carry alcohol hand gel with you at all times and use it frequently if out in public. All foods need to be cooked thoroughly. No raw foods. No medium or undercooked meats, eggs. If your food is cooked medium well, it does not need to be hot pink or saturated with bloody liquid at all. Vegetables and fruits need to be washed/rinsed under the faucet with a dish detergent before being consumed. You can eat raw fruits and vegetables unless we tell you otherwise but it would be best if you cooked them or bought frozen. Do not eat off of salad bars or hot bars unless you really trust the cleanliness of the restaurant. If you need dental work, please let us know before you go for your appointment so that we can coordinate the best possible time for you in regards to your chemo regimen. You need to also let your dentist know that you are actively taking chemo. We may need to do labs prior to your dental appointment. We also want your bowels moving at least every other day. If this is not happening, we need to know so that we can get you on a bowel regimen to help you go.     MEDICATIONS:  You have been given prescriptions for the following medications:  EMLA cream. Apply a quarter size amount to port site 1 hour prior to chemo. Do not rub in. Cover with plastic wrap.    Zofran 8mg  three times a day as needed for nausea/vomiting.   Imodium - this is for diarrhea. Take 2 tabs after 1st loose stool and then 1 tab every 2 hours until 12 hours have passed without you having a loose stool. If diarrhea occurs @ night, take 2 tablets every 4 hours. Call [facility] if loose stools continue.   Over-the-Counter Meds:  Colace - this is a stool softener. Take 100mg  capsule 2-6 times a day as needed. If you have to take more than 6 capsules of Colace a day call the [facility].  Senna - this is a mild laxative used to treat mild constipation. May take 2 tabs by mouth daily or up to twice a day as needed for mild constipation.  Milk of Magnesia - this is a laxative used to treat moderate to severe constipation. May take 2-4 tablespoons every 8 hours as needed. May increase to 8 tablespoons x 1 dose and if no bowel movement call the [facility].   SYMPTOMS TO REPORT AS SOON AS POSSIBLE AFTER TREATMENT:  FEVER GREATER THAN 100.5 F  CHILLS WITH OR WITHOUT FEVER  NAUSEA AND VOMITING THAT IS NOT CONTROLLED WITH YOUR NAUSEA MEDICATION  UNUSUAL SHORTNESS  OF BREATH  UNUSUAL BRUISING OR BLEEDING  TENDERNESS IN MOUTH AND THROAT WITH OR WITHOUT PRESENCE OF ULCERS  URINARY PROBLEMS  BOWEL PROBLEMS  UNUSUAL RASH    Wear comfortable clothing and clothing appropriate for easy access to any Portacath or PICC line. Let us know if there is anything that we can do to make your therapy better!      I have been informed and understand all of the instructions given to me and have received a copy. I have been instructed to call the clinic [phone] or my family physician as soon as possible for continued medical care, if indicated. I do not have any more questions at this time but understand that I may call the [facility] or the Patient Navigator at [phone] during office hours should I have questions or need assistance in obtaining follow-up  care.           Irinotecan injection What is this medicine? IRINOTECAN (ir in oh TEE kan ) is a chemotherapy drug. It is used to treat colon and rectal cancer. This medicine may be used for other purposes; ask your health care provider or pharmacist if you have questions. COMMON BRAND NAME(S): Camptosar What should I tell my health care provider before I take this medicine? They need to know if you have any of these conditions: -blood disorders -dehydration -diarrhea -infection (especially a virus infection such as chickenpox, cold sores, or herpes) -liver disease -low blood counts, like low white cell, platelet, or red cell counts -recent or ongoing radiation therapy -an unusual or allergic reaction to irinotecan, sorbitol, other chemotherapy, other medicines, foods, dyes, or preservatives -pregnant or trying to get pregnant -breast-feeding How should I use this medicine? This drug is given as an infusion into a vein. It is administered in a hospital or clinic by a specially trained health care professional. Talk to your pediatrician regarding the use of this medicine in children. Special care may be needed. Overdosage: If you think you have taken too much of this medicine contact a poison control center or emergency room at once. NOTE: This medicine is only for you. Do not share this medicine with others. What if I miss a dose? It is important not to miss your dose. Call your doctor or health care professional if you are unable to keep an appointment. What may interact with this medicine? Do not take this medicine with any of the following medications: -atazanavir -certain medicines for fungal infections like itraconazole and ketoconazole -St. John's Wort This medicine may also interact with the following medications: -dexamethasone -diuretics -laxatives -medicines for seizures like carbamazepine, mephobarbital, phenobarbital, phenytoin, primidone -medicines to increase  blood counts like filgrastim, pegfilgrastim, sargramostim -prochlorperazine -vaccines This list may not describe all possible interactions. Give your health care provider a list of all the medicines, herbs, non-prescription drugs, or dietary supplements you use. Also tell them if you smoke, drink alcohol, or use illegal drugs. Some items may interact with your medicine. What should I watch for while using this medicine? Your condition will be monitored carefully while you are receiving this medicine. You will need important blood work done while you are taking this medicine. This drug may make you feel generally unwell. This is not uncommon, as chemotherapy can affect healthy cells as well as cancer cells. Report any side effects. Continue your course of treatment even though you feel ill unless your doctor tells you to stop. In some cases, you may be given additional medicines  to help with side effects. Follow all directions for their use. You may get drowsy or dizzy. Do not drive, use machinery, or do anything that needs mental alertness until you know how this medicine affects you. Do not stand or sit up quickly, especially if you are an older patient. This reduces the risk of dizzy or fainting spells. Call your doctor or health care professional for advice if you get a fever, chills or sore throat, or other symptoms of a cold or flu. Do not treat yourself. This drug decreases your body's ability to fight infections. Try to avoid being around people who are sick. This medicine may increase your risk to bruise or bleed. Call your doctor or health care professional if you notice any unusual bleeding. Be careful brushing and flossing your teeth or using a toothpick because you may get an infection or bleed more easily. If you have any dental work done, tell your dentist you are receiving this medicine. Avoid taking products that contain aspirin, acetaminophen, ibuprofen, naproxen, or ketoprofen unless  instructed by your doctor. These medicines may hide a fever. Do not become pregnant while taking this medicine. Women should inform their doctor if they wish to become pregnant or think they might be pregnant. There is a potential for serious side effects to an unborn child. Talk to your health care professional or pharmacist for more information. Do not breast-feed an infant while taking this medicine. What side effects may I notice from receiving this medicine? Side effects that you should report to your doctor or health care professional as soon as possible: -allergic reactions like skin rash, itching or hives, swelling of the face, lips, or tongue -low blood counts - this medicine may decrease the number of white blood cells, red blood cells and platelets. You may be at increased risk for infections and bleeding. -signs of infection - fever or chills, cough, sore throat, pain or difficulty passing urine -signs of decreased platelets or bleeding - bruising, pinpoint red spots on the skin, black, tarry stools, blood in the urine -signs of decreased red blood cells - unusually weak or tired, fainting spells, lightheadedness -breathing problems -chest pain -diarrhea -feeling faint or lightheaded, falls -flushing, runny nose, sweating during infusion -mouth sores or pain -pain, swelling, redness or irritation where injected -pain, swelling, warmth in the leg -pain, tingling, numbness in the hands or feet -problems with balance, talking, walking -stomach cramps, pain -trouble passing urine or change in the amount of urine -vomiting as to be unable to hold down drinks or food -yellowing of the eyes or skin Side effects that usually do not require medical attention (report to your doctor or health care professional if they continue or are bothersome): -constipation -hair loss -headache -loss of appetite -nausea, vomiting -stomach upset This list may not describe all possible side effects.  Call your doctor for medical advice about side effects. You may report side effects to FDA at 1-800-FDA-1088. Where should I keep my medicine? This drug is given in a hospital or clinic and will not be stored at home. NOTE: This sheet is a summary. It may not cover all possible information. If you have questions about this medicine, talk to your doctor, pharmacist, or health care provider.  2015, Elsevier/Gold Standard. (2013-06-13 16:29:32) Bevacizumab injection What is this medicine? BEVACIZUMAB (be va SIZ yoo mab) is a chemotherapy drug. It targets a protein found in many cancer cell types, and halts cancer growth. This drug treats many cancers including non-small  cell lung cancer, ovarian cancer, cervical cancer, and colon or rectal cancer. It is usually given with other chemotherapy drugs. This medicine may be used for other purposes; ask your health care provider or pharmacist if you have questions. COMMON BRAND NAME(S): Avastin What should I tell my health care provider before I take this medicine? They need to know if you have any of these conditions: -blood clots -heart disease, including heart failure, heart attack, or chest pain (angina) -high blood pressure -infection (especially a virus infection such as chickenpox, cold sores, or herpes) -kidney disease -lung disease -prior chemotherapy with doxorubicin, daunorubicin, epirubicin, or other anthracycline type chemotherapy agents -recent or ongoing radiation therapy -recent surgery -stroke -an unusual or allergic reaction to bevacizumab, hamster proteins, mouse proteins, other medicines, foods, dyes, or preservatives -pregnant or trying to get pregnant -breast-feeding How should I use this medicine? This medicine is for infusion into a vein. It is given by a health care professional in a hospital or clinic setting. Talk to your pediatrician regarding the use of this medicine in children. Special care may be needed. Overdosage:  If you think you have taken too much of this medicine contact a poison control center or emergency room at once. NOTE: This medicine is only for you. Do not share this medicine with others. What if I miss a dose? It is important not to miss your dose. Call your doctor or health care professional if you are unable to keep an appointment. What may interact with this medicine? Interactions are not expected. This list may not describe all possible interactions. Give your health care provider a list of all the medicines, herbs, non-prescription drugs, or dietary supplements you use. Also tell them if you smoke, drink alcohol, or use illegal drugs. Some items may interact with your medicine. What should I watch for while using this medicine? Your condition will be monitored carefully while you are receiving this medicine. You will need important blood work and urine testing done while you are taking this medicine. During your treatment, let your health care professional know if you have any unusual symptoms, such as difficulty breathing. This medicine may rarely cause 'gastrointestinal perforation' (holes in the stomach, intestines or colon), a serious side effect requiring surgery to repair. This medicine should be started at least 28 days following major surgery and the site of the surgery should be totally healed. Check with your doctor before scheduling dental work or surgery while you are receiving this treatment. Talk to your doctor if you have recently had surgery or if you have a wound that has not healed. Do not become pregnant while taking this medicine. Women should inform their doctor if they wish to become pregnant or think they might be pregnant. There is a potential for serious side effects to an unborn child. Talk to your health care professional or pharmacist for more information. Do not breast-feed an infant while taking this medicine. This medicine has caused ovarian failure in some women.  This medicine may interfere with the ability to have a child. You should talk to your doctor or health care professional if you are concerned about your fertility. What side effects may I notice from receiving this medicine? Side effects that you should report to your doctor or health care professional as soon as possible: -allergic reactions like skin rash, itching or hives, swelling of the face, lips, or tongue -signs of infection - fever or chills, cough, sore throat, pain or trouble passing urine -signs of  decreased platelets or bleeding - bruising, pinpoint red spots on the skin, black, tarry stools, nosebleeds, blood in the urine -breathing problems -changes in vision -chest pain -confusion -jaw pain, especially after dental work -mouth sores -seizures -severe abdominal pain -severe headache -sudden numbness or weakness of the face, arm or leg -swelling of legs or ankles -symptoms of a stroke: change in mental awareness, inability to talk or move one side of the body (especially in patients with lung cancer) -trouble passing urine or change in the amount of urine -trouble speaking or understanding -trouble walking, dizziness, loss of balance or coordination Side effects that usually do not require medical attention (report to your doctor or health care professional if they continue or are bothersome): -constipation -diarrhea -dry skin -headache -loss of appetite -nausea, vomiting This list may not describe all possible side effects. Call your doctor for medical advice about side effects. You may report side effects to FDA at 1-800-FDA-1088. Where should I keep my medicine? This drug is given in a hospital or clinic and will not be stored at home. NOTE: This sheet is a summary. It may not cover all possible information. If you have questions about this medicine, talk to your doctor, pharmacist, or health care provider.  2015, [employer]/Gold Standard. (2013-11-15  11:38:34) Atropine injection What is this medicine? ATROPINE (A troe peen) can help treat many conditions. This medicine is used to reduce saliva and fluid in the respiratory tract during surgery. It is also used to treat insecticide or mushroom poisoning. It can be used in an emergency to treat a slow heartbeat. This medicine may be used for other purposes; ask your health care provider or pharmacist if you have questions. What should I tell my health care provider before I take this medicine? They need to know if you have any of these conditions: -closed-angle glaucoma -heart disease, or previous heart attack -kidney disease -prostate trouble -stomach obstruction -an unusual or allergic reaction to atropine, other medicines, foods, dyes, or preservatives -pregnant or trying to get pregnant -breast-feeding How should I use this medicine? This medicine is for injection into a muscle, vein, or under the skin. It is usually given by a health care professional in a hospital or clinic setting. If you get this medicine at home, you will be taught how to prepare and give this medicine. Use exactly as directed. It should only be given by persons who have training in the signs and treatment of nerve agent or insecticide poisoning. It is important that you put your used needles and syringes in a special sharps container. Do not put them in a trash can. If you do not have a sharps container, call your pharmacist or healthcare provider to get one. Talk to your pediatrician regarding the use of this medicine in children. Special care may be needed. Overdosage: If you think you have taken too much of this medicine contact a poison control center or emergency room at once. NOTE: This medicine is only for you. Do not share this medicine with others. What if I miss a dose? This does not apply. What may interact with this medicine? -atomoxetine -barbiturates, like  phenobarbital -benztropine -donepezil -ephedra -galantamine -glutethimide -medicines for mental problems and psychotic disturbances -medicines for Parkinson's disease -pralidoxime -rivastigmine -some medicines for congestion, cold, or allergy -stimulant medicines for attention disorders, weight loss, or to stay awake -tacrine -tegaserod This list may not describe all possible interactions. Give your health care provider a list of all the medicines, herbs,  non-prescription drugs, or dietary supplements you use. Also tell them if you smoke, drink alcohol, or use illegal drugs. Some items may interact with your medicine. What should I watch for while using this medicine? Side effects may occur even though you are no longer using this medicine. Contact your doctor or health care professional if you are still experiencing side effects after several days. You may get drowsy or dizzy. Do not drive, use machinery, or do anything that needs mental alertness until you know how this medicine affects you. Do not stand or sit up quickly, especially if you are an older patient. This reduces the risk of dizzy or fainting spells. Alcohol may interfere with the effect of this medicine. Avoid alcoholic drinks. Your mouth may get dry. Chewing sugarless gum or sucking hard candy, and drinking plenty of water may help. Contact your doctor if the problem does not go away or is severe. This medicine may cause dry eyes and blurred vision. If you wear contact lenses you may feel some discomfort. Lubricating drops may help. See your eye doctor if the problem does not go away or is severe. Avoid extreme heat. This medicine can cause you to sweat less than normal. Your body temperature could increase to dangerous levels, which may lead to heat stroke. What side effects may I notice from receiving this medicine? Side effects that you should report to your doctor or health care professional as soon as possible: -allergic  reactions like skin rash, itching or hives, swelling of the face, lips, or tongue -anxiety, nervousness -changes in vision -confusion -fast or slow heartbeat -feeling faint or lightheaded, falls -hallucinations -memory loss -redness, blistering, peeling or loosening of the skin, including inside the mouth -slurred speech -trouble passing urine or change in the amount of urine -unusually weak or tired -vomiting Side effects that usually do not require medical attention (report to your doctor or health care professional if they continue or are bothersome): -constipation -dry mouth -flushing or redness of face or skin within 15 to 20 minutes after the injection -nausea This list may not describe all possible side effects. Call your doctor for medical advice about side effects. You may report side effects to FDA at 1-800-FDA-1088. Where should I keep my medicine? Keep out of the reach of children. If you are using this medicine at home, you will be instructed on how to store this medicine. Throw away any unused medicine after the expiration date on the label. NOTE: This sheet is a summary. It may not cover all possible information. If you have questions about this medicine, talk to your doctor, pharmacist, or health care provider.  2015, Elsevier/Gold Standard. (2008-03-08 09:47:01) Lidocaine; Prilocaine cream What is this medicine? LIDOCAINE; PRILOCAINE (LYE doe kane; PRIL oh kane) is a topical anesthetic that causes loss of feeling in the skin and surrounding tissues. It is used to numb the skin before procedures or injections. This medicine may be used for other purposes; ask your health care provider or pharmacist if you have questions. COMMON BRAND NAME(S): EMLA What should I tell my health care provider before I take this medicine? They need to know if you have any of these conditions: -glucose-6-phosphate deficiencies -heart disease -kidney or liver  disease -methemoglobinemia -an unusual or allergic reaction to lidocaine, prilocaine, other medicines, foods, dyes, or preservatives -pregnant or trying to get pregnant -breast-feeding How should I use this medicine? This medicine is for external use only on the skin. Do not take by mouth. Follow  the directions on the prescription label. Wash hands before and after use. Do not use more or leave in contact with the skin longer than directed. Do not apply to eyes or open wounds. It can cause irritation and blurred or temporary loss of vision. If this medicine comes in contact with your eyes, immediately rinse the eye with water. Do not touch or rub the eye. Contact your health care provider right away. Talk to your pediatrician regarding the use of this medicine in children. While this medicine may be prescribed for children for selected conditions, precautions do apply. Overdosage: If you think you have taken too much of this medicine contact a poison control center or emergency room at once. NOTE: This medicine is only for you. Do not share this medicine with others. What if I miss a dose? This medicine is usually only applied once prior to each procedure. It must be in contact with the skin for a period of time for it to work. If you applied this medicine later than directed, tell your health care professional before starting the procedure. What may interact with this medicine? -acetaminophen -chloroquine -dapsone -medicines to control heart rhythm -nitrates like nitroglycerin and nitroprusside -other ointments, creams, or sprays that may contain anesthetic medicine -phenobarbital -phenytoin -quinine -sulfonamides like sulfacetamide, sulfamethoxazole, sulfasalazine and others This list may not describe all possible interactions. Give your health care provider a list of all the medicines, herbs, non-prescription drugs, or dietary supplements you use. Also tell them if you smoke, drink alcohol,  or use illegal drugs. Some items may interact with your medicine. What should I watch for while using this medicine? Be careful to avoid injury to the treated area while it is numb and you are not aware of pain. Avoid scratching, rubbing, or exposing the treated area to hot or cold temperatures until complete sensation has returned. The numb feeling will wear off a few hours after applying the cream. What side effects may I notice from receiving this medicine? Side effects that you should report to your doctor or health care professional as soon as possible: -blurred vision -chest pain -difficulty breathing -dizziness -drowsiness -fast or irregular heartbeat -skin rash or itching -swelling of your throat, lips, or face -trembling Side effects that usually do not require medical attention (report to your doctor or health care professional if they continue or are bothersome): -changes in ability to feel hot or cold -redness and swelling at the application site This list may not describe all possible side effects. Call your doctor for medical advice about side effects. You may report side effects to FDA at 1-800-FDA-1088. Where should I keep my medicine? Keep out of reach of children. Store at room temperature between 15 and 30 degrees C (59 and 86 degrees F). Keep container tightly closed. Throw away any unused medicine after the expiration date. NOTE: This sheet is a summary. It may not cover all possible information. If you have questions about this medicine, talk to your doctor, pharmacist, or health care provider.  2015, Elsevier/Gold Standard. (2008-06-19 17:14:35) Loperamide tablets or capsules What is this medicine? LOPERAMIDE (loe PER a mide) is used to treat diarrhea. This medicine may be used for other purposes; ask your health care provider or pharmacist if you have questions. COMMON BRAND NAME(S): Anti-Diarrheal, Imodium A-D, K-Pek II What should I tell my health care provider  before I take this medicine? They need to know if you have any of these conditions: -a black or bloody stool -bacterial  food poisoning -colitis or mucus in your stool -currently taking an antibiotic medication for an infection -fever -liver disease -severe abdominal pain, swelling or bulging -an unusual or allergic reaction to loperamide, other medicines, foods, dyes, or preservatives -pregnant or trying to get pregnant -breast-feeding How should I use this medicine? Take this medicine by mouth with a glass of water. Follow the directions on the prescription label. Take your doses at regular intervals. Do not take your medicine more often than directed. Talk to your pediatrician regarding the use of this medicine in children. Special care may be needed. Overdosage: If you think you have taken too much of this medicine contact a poison control center or emergency room at once. NOTE: This medicine is only for you. Do not share this medicine with others. What if I miss a dose? This does not apply. This medicine is not for regular use. Only take this medicine while you continue to have loose bowel movements. Do not take more medicine than recommended by the packaging label or by your healthcare professional. What may interact with this medicine? Do not take this medicine with any of the following medications: -alosetron This medicine may also interact with the following medications: -quinidine -ritonavir -saquinavir This list may not describe all possible interactions. Give your health care provider a list of all the medicines, herbs, non-prescription drugs, or dietary supplements you use. Also tell them if you smoke, drink alcohol, or use illegal drugs. Some items may interact with your medicine. What should I watch for while using this medicine? Do not take this medicine for more than 1 week without asking your doctor or health care professional. If your symptoms do not start to get better  after two days, you may have a problem that needs further evaluation. Check with your doctor or health care professional right away if you develop a fever, severe abdominal pain, swelling or bulging, or if you have have bloody/black diarrhea or stools. You may get drowsy or dizzy. Do not drive, use machinery, or do anything that needs mental alertness until you know how this medicine affects you. Do not stand or sit up quickly, especially if you are an older patient. This reduces the risk of dizzy or fainting spells. Alcohol can increase possible drowsiness and dizziness. Avoid alcoholic drinks. Your mouth may get dry. Chewing sugarless gum or sucking hard candy, and drinking plenty of water may help. Contact your doctor if the problem does not go away or is severe. Drinking plenty of water can also help prevent dehydration that can occur with diarrhea. Elderly patients may have a more variable response to the effects of this medicine, and are more susceptible to the effects of dehydration. What side effects may I notice from receiving this medicine? Side effects that you should report to your doctor or health care professional as soon as possible: -allergic reactions like skin rash, itching or hives, swelling of the face, lips, or tongue -bloated, swollen feeling in your abdomen -blurred vision -loss of appetite -stomach pain Side effects that usually do not require medical attention (report to your doctor or health care professional if they continue or are bothersome): -constipation -drowsiness or dizziness -dry mouth -nausea, vomiting This list may not describe all possible side effects. Call your doctor for medical advice about side effects. You may report side effects to FDA at 1-800-FDA-1088. Where should I keep my medicine? Keep out of the reach of children. Store at room temperature between 15 and 25 degrees C (  59 and 77 degrees F). Keep container tightly closed. Throw away any unused  medicine after the expiration date. NOTE: This sheet is a summary. It may not cover all possible information. If you have questions about this medicine, talk to your doctor, pharmacist, or health care provider.  2015, Elsevier/Gold Standard. (2008-06-20 16:02:13) Ondansetron tablets What is this medicine? ONDANSETRON (on DAN se tron) is used to treat nausea and vomiting caused by chemotherapy. It is also used to prevent or treat nausea and vomiting after surgery. This medicine may be used for other purposes; ask your health care provider or pharmacist if you have questions. COMMON BRAND NAME(S): Zofran What should I tell my health care provider before I take this medicine? They need to know if you have any of these conditions: -heart disease -history of irregular heartbeat -liver disease -low levels of magnesium or potassium in the blood -an unusual or allergic reaction to ondansetron, granisetron, other medicines, foods, dyes, or preservatives -pregnant or trying to get pregnant -breast-feeding How should I use this medicine? Take this medicine by mouth with a glass of water. Follow the directions on your prescription label. Take your doses at regular intervals. Do not take your medicine more often than directed. Talk to your pediatrician regarding the use of this medicine in children. Special care may be needed. Overdosage: If you think you have taken too much of this medicine contact a poison control center or emergency room at once. NOTE: This medicine is only for you. Do not share this medicine with others. What if I miss a dose? If you miss a dose, take it as soon as you can. If it is almost time for your next dose, take only that dose. Do not take double or extra doses. What may interact with this medicine? Do not take this medicine with any of the following medications: -apomorphine -certain medicines for fungal infections like fluconazole, itraconazole, ketoconazole,  posaconazole, voriconazole -cisapride -dofetilide -dronedarone -pimozide -thioridazine -ziprasidone This medicine may also interact with the following medications: -carbamazepine -certain medicines for depression, anxiety, or psychotic disturbances -fentanyl -linezolid -MAOIs like Carbex, Eldepryl, Marplan, Nardil, and Parnate -methylene blue (injected into a vein) -other medicines that prolong the QT interval (cause an abnormal heart rhythm) -phenytoin -rifampicin -tramadol This list may not describe all possible interactions. Give your health care provider a list of all the medicines, herbs, non-prescription drugs, or dietary supplements you use. Also tell them if you smoke, drink alcohol, or use illegal drugs. Some items may interact with your medicine. What should I watch for while using this medicine? Check with your doctor or health care professional right away if you have any sign of an allergic reaction. What side effects may I notice from receiving this medicine? Side effects that you should report to your doctor or health care professional as soon as possible: -allergic reactions like skin rash, itching or hives, swelling of the face, lips or tongue -breathing problems -confusion -dizziness -fast or irregular heartbeat -feeling faint or lightheaded, falls -fever and chills -loss of balance or coordination -seizures -sweating -swelling of the hands or feet -tightness in the chest -tremors -unusually weak or tired Side effects that usually do not require medical attention (report to your doctor or health care professional if they continue or are bothersome): -constipation or diarrhea -headache This list may not describe all possible side effects. Call your doctor for medical advice about side effects. You may report side effects to FDA at 1-800-FDA-1088. Where should I keep my  medicine? Keep out of the reach of children. Store between 2 and 30 degrees C (36 and 86  degrees F). Throw away any unused medicine after the expiration date. NOTE: This sheet is a summary. It may not cover all possible information. If you have questions about this medicine, talk to your doctor, pharmacist, or health care provider.  2015, Elsevier/Gold Standard. (2013-09-21 16:27:45)

## 2014-10-10 NOTE — H&P (Signed)
Agree.  Patient seen.  For port placement today. 

## 2014-10-10 NOTE — H&P (Signed)
Chief Complaint: "I am here for a port."  Referring Physician(s): Formanek,Gregory  History of Present Illness: Cody Buchanan is a 58 y.o. 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 He is scheduled today for image guided port a catheter with moderate sedation for chemotherapy 10/14. He denies any chest pain, shortness of breath or palpitations. He denies any active signs of bleeding or excessive bruising. He denies any recent fever or chills. The patient denies any history of sleep apnea or chronic oxygen use. He has previously tolerated anesthesia without complications.    Past Medical History  Diagnosis Date  . Glioblastoma   . GERD (gastroesophageal reflux disease)   . Seizures   . Anxiety 09/07/2014    Past Surgical History  Procedure Laterality Date  . Salivary gland surgery    . Brain tumor excision      Allergies: Review of patient's allergies indicates no known allergies.  Medications: Prior to Admission medications   Medication Sig Start Date End Date Taking? Authorizing Provider  acetaminophen (TYLENOL) 325 MG tablet Take by mouth every 6 (six) hours as needed.  03/06/14  Yes Historical Provider, MD  diazepam (VALIUM) 10 MG tablet To one tablet daily for anxiety 09/07/14  Yes Manon Hilding Kefalas, PA-C  levETIRAcetam (KEPPRA) 1000 MG tablet Take 1,000 mg by mouth 2 (two) times daily. 02/22/14  Yes Historical Provider, MD  Multiple Vitamins-Minerals (MULTIVITAMIN WITH MINERALS) tablet Take 1 tablet by mouth daily.   Yes Historical Provider, MD  ondansetron (ZOFRAN ODT) 8 MG disintegrating tablet Place 1 tablet on tongue every 8 hours to control nausea. 04/21/14  Yes Farrel Gobble, MD  ondansetron (ZOFRAN) 8 MG tablet Take by mouth every 8 (eight) hours as needed for nausea or vomiting.   Yes Historical Provider, MD  oxycodone (OXY-IR) 5 MG capsule Take 1 or 2 tablets up to every 4 hours as needed for pain or headache. 09/29/14  Yes Baird Cancer, PA-C  tamsulosin (FLOMAX) 0.4 MG CAPS capsule Take one capsule at bedtime nightly for urinary frequency. 07/11/14  Yes Farrel Gobble, MD  albuterol (PROAIR HFA) 108 (90 BASE) MCG/ACT inhaler Inhale into the lungs every 6 (six) hours as needed.  02/22/14   Historical Provider, MD  bisacodyl (DULCOLAX) 5 MG EC tablet Take 5 mg by mouth daily as needed for moderate constipation.    Historical Provider, MD  ibuprofen (ADVIL,MOTRIN) 200 MG tablet Take 400 mg by mouth every 6 (six) hours as needed. Pain    Historical Provider, MD  methylPREDNIsolone (MEDROL DOSPACK) 4 MG tablet follow package directions 05/15/14   Farrel Gobble, MD  temozolomide (TEMODAR) 140 MG capsule Take 140 mg by mouth daily. May take on an empty stomach or at bedtime to decrease nausea & vomiting. Take along with 20 mg Temodar capsule daily  Started a double dose on Friday, 7/10, will take for 5 days. 04/13/14   Farrel Gobble, MD  temozolomide (TEMODAR) 20 MG capsule Take 1 capsule (20 mg total) by mouth daily. May take on an empty stomach or at bedtime to decrease nausea & vomiting. Take 1 capsule daily along with 140 mg Temodar capsule 04/13/14   Farrel Gobble, MD  tiotropium (SPIRIVA) 18 MCG inhalation capsule Place 1 capsule into inhaler and inhale daily. 02/22/14   Historical Provider, MD    Family History  Problem Relation Age of Onset  . Cancer Mother   . Diabetes Mother   . Hypertension Mother   . Stroke  Mother   . Diabetes Father     History   Social History  . Marital Status: Single    Spouse Name: N/A    Number of Children: N/A  . Years of Education: N/A   Social History Main Topics  . Smoking status: Current Every Day Smoker    Types: Cigarettes  . Smokeless tobacco: Never Used  . Alcohol Use: No  . Drug Use: No  . Sexual Activity: None   Other Topics Concern  . None   Social History Narrative  . None    Review of Systems: A 12 point ROS discussed and pertinent positives are  indicated in the HPI above.  All other systems are negative.  Review of Systems  Vital Signs: BP 128/75  Pulse 65  Temp(Src) 98 F (36.7 C) (Oral)  Resp 18  Ht 5\' 9"  (1.753 m)  Wt 185 lb (83.915 kg)  BMI 27.31 kg/m2  SpO2 99%  Physical Exam  Constitutional: He is oriented to person, place, and time. He appears well-developed and well-nourished. No distress.  HENT:  Head: Normocephalic and atraumatic.  Neck: No tracheal deviation present.  Cardiovascular: Normal rate and regular rhythm.  Exam reveals no gallop and no friction rub.   No murmur heard. Pulmonary/Chest: Effort normal and breath sounds normal. No respiratory distress. He has no wheezes. He has no rales.  Abdominal: Soft. Bowel sounds are normal. He exhibits no distension. There is no tenderness.  Neurological: He is alert and oriented to person, place, and time.  Skin: Skin is warm and dry. He is not diaphoretic.  Psychiatric:  Affect slightly flat   Imaging: No results found.  Labs:  CBC:  Recent Labs  05/18/14 0829 06/23/14 0956 07/11/14 0959 10/09/14 1436  WBC 12.9* 7.6 7.6 10.7*  HGB 13.1 14.6 14.6 14.0  HCT 39.3 43.3 42.4 40.5  PLT 309 241 200 196    COAGS: No results found for this basename: INR, APTT,  in the last 8760 hours  BMP:  Recent Labs  04/21/14 1430 06/23/14 0956 07/11/14 0959 10/09/14 1436  NA 135* 141 140 139  K 3.5* 4.2 4.2 3.7  CL 97 102 103 104  CO2 23 26 26 21   GLUCOSE 93 129* 90 95  BUN 19 12 14 22   CALCIUM 9.8 9.3 9.3 8.7  CREATININE 1.16 1.09 1.08 1.24  GFRNONAA 68* 74* 74* 63*  GFRAA 79* 85* 86* 73*    LIVER FUNCTION TESTS:  Recent Labs  04/21/14 1430 06/23/14 0956 07/11/14 0959 10/09/14 1436  BILITOT 0.2* 0.5 0.4 0.2*  AST 20 23 19 15   ALT 27 24 17 18   ALKPHOS 88 88 84 72  PROT 7.2 7.5 7.2 6.8  ALBUMIN 3.4* 3.8 3.7 3.3*   Assessment and Plan: Recurrent glioblastoma of left frontal lobe s/p resection  Scheduled today for image guided port a  catheter with moderate sedation for chemotherapy 10/14 Patient last drank at 0700, no blood thinners, labs reviewed. Leukocytosis, afebrile and on decadron taken last evening.  Risks and Benefits discussed with the patient. All of the patient's questions were answered, patient is agreeable to proceed. Consent signed and in chart. COPD Seizure disorder on Keppra BPH    Signed: Hedy Jacob 10/10/2014, 10:05 AM

## 2014-10-10 NOTE — Discharge Instructions (Signed)
Implanted Port Home Guide °An implanted port is a type of central line that is placed under the skin. Central lines are used to provide IV access when treatment or nutrition needs to be given through a person's veins. Implanted ports are used for long-term IV access. An implanted port may be placed because:  °· You need IV medicine that would be irritating to the small veins in your hands or arms.   °· You need long-term IV medicines, such as antibiotics.   °· You need IV nutrition for a long period.   °· You need frequent blood draws for lab tests.   °· You need dialysis.   °Implanted ports are usually placed in the chest area, but they can also be placed in the upper arm, the abdomen, or the leg. An implanted port has two main parts:  °· Reservoir. The reservoir is round and will appear as a small, raised area under your skin. The reservoir is the part where a needle is inserted to give medicines or draw blood.   °· Catheter. The catheter is a thin, flexible tube that extends from the reservoir. The catheter is placed into a large vein. Medicine that is inserted into the reservoir goes into the catheter and then into the vein.   °HOW WILL I CARE FOR MY INCISION SITE? °Do not get the incision site wet. Bathe or shower as directed by your health care provider.  °HOW IS MY PORT ACCESSED? °Special steps must be taken to access the port:  °· Before the port is accessed, a numbing cream can be placed on the skin. This helps numb the skin over the port site.   °· Your health care provider uses a sterile technique to access the port. °¨ Your health care provider must put on a mask and sterile gloves. °¨ The skin over your port is cleaned carefully with an antiseptic and allowed to dry. °¨ The port is gently pinched between sterile gloves, and a needle is inserted into the port. °· Only "non-coring" port needles should be used to access the port. Once the port is accessed, a blood return should be checked. This helps  ensure that the port is in the vein and is not clogged.   °· If your port needs to remain accessed for a constant infusion, a clear (transparent) bandage will be placed over the needle site. The bandage and needle will need to be changed every week, or as directed by your health care provider.   °· Keep the bandage covering the needle clean and dry. Do not get it wet. Follow your health care provider's instructions on how to take a shower or bath while the port is accessed.   °· If your port does not need to stay accessed, no bandage is needed over the port.   °WHAT IS FLUSHING? °Flushing helps keep the port from getting clogged. Follow your health care provider's instructions on how and when to flush the port. Ports are usually flushed with saline solution or a medicine called heparin. The need for flushing will depend on how the port is used.  °· If the port is used for intermittent medicines or blood draws, the port will need to be flushed:   °¨ After medicines have been given.   °¨ After blood has been drawn.   °¨ As part of routine maintenance.   °· If a constant infusion is running, the port may not need to be flushed.   °HOW LONG WILL MY PORT STAY IMPLANTED? °The port can stay in for as long as your health care   provider thinks it is needed. When it is time for the port to come out, surgery will be done to remove it. The procedure is similar to the one performed when the port was put in.  °WHEN SHOULD I SEEK IMMEDIATE MEDICAL CARE? °When you have an implanted port, you should seek immediate medical care if:  °· You notice a bad smell coming from the incision site.   °· You have swelling, redness, or drainage at the incision site.   °· You have more swelling or pain at the port site or the surrounding area.   °· You have a fever that is not controlled with medicine. °Document Released: 12/15/2005 Document Revised: 10/05/2013 Document Reviewed: 08/22/2013 °ExitCare® Patient Information ©2015 [employer]. This  information is not intended to replace advice given to you by your health care provider. Make sure you discuss any questions you have with your health care provider. ° °Conscious Sedation, Adult, Care After °Refer to this sheet in the next few weeks. These instructions provide you with information on caring for yourself after your procedure. Your health care provider may also give you more specific instructions. Your treatment has been planned according to current medical practices, but problems sometimes occur. Call your health care provider if you have any problems or questions after your procedure. °WHAT TO EXPECT AFTER THE PROCEDURE  °After your procedure: °· You may feel sleepy, clumsy, and have poor balance for several hours. °· Vomiting may occur if you eat too soon after the procedure. °HOME CARE INSTRUCTIONS °· Do not participate in any activities where you could become injured for at least 24 hours. Do not: °¨ Drive. °¨ Swim. °¨ Ride a bicycle. °¨ Operate heavy machinery. °¨ Cook. °¨ Use power tools. °¨ Climb ladders. °¨ Work from a high place. °· Do not make important decisions or sign legal documents until you are improved. °· If you vomit, drink water, juice, or soup when you can drink without vomiting. Make sure you have little or no nausea before eating solid foods. °· Only take over-the-counter or prescription medicines for pain, discomfort, or fever as directed by your health care provider. °· Make sure you and your family fully understand everything about the medicines given to you, including what side effects may occur. °· You should not drink alcohol, take sleeping pills, or take medicines that cause drowsiness for at least 24 hours. °· If you smoke, do not smoke without supervision. °· If you are feeling better, you may resume normal activities 24 hours after you were sedated. °· Keep all appointments with your health care provider. °SEEK MEDICAL CARE IF: °· Your skin is pale or bluish in  color. °· You continue to feel nauseous or vomit. °· Your pain is getting worse and is not helped by medicine. °· You have bleeding or swelling. °· You are still sleepy or feeling clumsy after 24 hours. °SEEK IMMEDIATE MEDICAL CARE IF: °· You develop a rash. °· You have difficulty breathing. °· You develop any type of allergic problem. °· You have a fever. °MAKE SURE YOU: °· Understand these instructions. °· Will watch your condition. °· Will get help right away if you are not doing well or get worse. °Document Released: 10/05/2013 Document Reviewed: 10/05/2013 °ExitCare® Patient Information ©2015 [employer]. This information is not intended to replace advice given to you by your health care provider. Make sure you discuss any questions you have with your health care provider. ° ° °

## 2014-10-11 ENCOUNTER — Encounter (HOSPITAL_COMMUNITY): Payer: Medicaid Other

## 2014-10-11 ENCOUNTER — Encounter (HOSPITAL_BASED_OUTPATIENT_CLINIC_OR_DEPARTMENT_OTHER): Payer: Medicaid Other

## 2014-10-11 ENCOUNTER — Encounter: Payer: Self-pay | Admitting: *Deleted

## 2014-10-11 ENCOUNTER — Encounter (HOSPITAL_COMMUNITY): Payer: Self-pay

## 2014-10-11 VITALS — BP 139/73 | HR 61 | Temp 97.8°F | Resp 16 | Wt 181.0 lb

## 2014-10-11 DIAGNOSIS — C719 Malignant neoplasm of brain, unspecified: Secondary | ICD-10-CM

## 2014-10-11 DIAGNOSIS — Z5111 Encounter for antineoplastic chemotherapy: Secondary | ICD-10-CM

## 2014-10-11 DIAGNOSIS — C711 Malignant neoplasm of frontal lobe: Secondary | ICD-10-CM

## 2014-10-11 DIAGNOSIS — Z5112 Encounter for antineoplastic immunotherapy: Secondary | ICD-10-CM

## 2014-10-11 LAB — URINALYSIS, DIPSTICK ONLY
Bilirubin Urine: NEGATIVE
Glucose, UA: NEGATIVE mg/dL
Hgb urine dipstick: NEGATIVE
KETONES UR: NEGATIVE mg/dL
LEUKOCYTES UA: NEGATIVE
NITRITE: NEGATIVE
PROTEIN: NEGATIVE mg/dL
Specific Gravity, Urine: 1.01 (ref 1.005–1.030)
Urobilinogen, UA: 0.2 mg/dL (ref 0.0–1.0)
pH: 6 (ref 5.0–8.0)

## 2014-10-11 MED ORDER — IRINOTECAN HCL CHEMO INJECTION 100 MG/5ML
125.0000 mg/m2 | Freq: Once | INTRAVENOUS | Status: AC
  Administered 2014-10-11: 252 mg via INTRAVENOUS
  Filled 2014-10-11: qty 4.2

## 2014-10-11 MED ORDER — SODIUM CHLORIDE 0.9 % IJ SOLN
10.0000 mL | INTRAMUSCULAR | Status: DC | PRN
  Administered 2014-10-11: 10 mL

## 2014-10-11 MED ORDER — DEXAMETHASONE SODIUM PHOSPHATE 10 MG/ML IJ SOLN: 10.0000 mg | Freq: Once | INTRAMUSCULAR | Status: DC

## 2014-10-11 MED ORDER — SODIUM CHLORIDE 0.9 % IV SOLN
Freq: Once | INTRAVENOUS | Status: AC
  Administered 2014-10-11: 12:00:00 via INTRAVENOUS

## 2014-10-11 MED ORDER — HEPARIN SOD (PORK) LOCK FLUSH 100 UNIT/ML IV SOLN
500.0000 [IU] | Freq: Once | INTRAVENOUS | Status: AC | PRN
  Administered 2014-10-11: 500 [IU]
  Filled 2014-10-11: qty 5

## 2014-10-11 MED ORDER — OXYCODONE HCL 5 MG PO CAPS: ORAL_CAPSULE | ORAL | Status: DC

## 2014-10-11 MED ORDER — ATROPINE SULFATE 1 MG/ML IJ SOLN
0.5000 mg | Freq: Once | INTRAMUSCULAR | Status: AC | PRN
  Administered 2014-10-11: 0.5 mg via INTRAVENOUS
  Filled 2014-10-11: qty 1

## 2014-10-11 MED ORDER — SODIUM CHLORIDE 0.9 % IV SOLN
10.0000 mg/kg | Freq: Once | INTRAVENOUS | Status: AC
  Administered 2014-10-11: 850 mg via INTRAVENOUS
  Filled 2014-10-11: qty 34

## 2014-10-11 MED ORDER — ONDANSETRON HCL 40 MG/20ML IJ SOLN: 8.0000 mg | Freq: Once | INTRAMUSCULAR | Status: DC

## 2014-10-11 MED ORDER — SODIUM CHLORIDE 0.9 % IV SOLN
Freq: Once | INTRAVENOUS | Status: AC
  Administered 2014-10-11: 8 mg via INTRAVENOUS
  Filled 2014-10-11: qty 4

## 2014-10-11 NOTE — Progress Notes (Signed)
Chemo teaching done and consent signed for Irinotecan & Avastin. Chemo calendar given. What to Know After Chemo paper given to patient also. Distress screening done.

## 2014-10-11 NOTE — Progress Notes (Signed)
[place] Clinical Social Work  Clinical Social Work was referred by patient navigator for assessment of psychosocial needs due to new treatment plan.  Clinical Social Worker met with or patient at [facility] to offer support and assess for needs.  Pt reports to live at home with his elderly parents and has some financial concerns due to his diagnosis. CSW could only meet with pt very briefly today as he was receiving his chemo teaching. CSW introduced self, explained CSW role and provided pt with CSW info sheet. CSW will follow and assist as needed for possible concerns.   Clinical Social Work interventions: CSW role education   Loren Racer, [place] Tuesdays 8:30-1pm Wednesdays 8:30-12pm  [phone]

## 2014-10-11 NOTE — Patient Instructions (Signed)
[facility] Discharge Instructions for Patients Receiving Chemotherapy  Today you received the following chemotherapy agents Irinotecan and Avastin.  To help prevent nausea and vomiting after your treatment, we encourage you to take your nausea medication as instructed.   If you develop nausea and vomiting that is not controlled by your nausea medication, call the clinic. If it is after clinic hours your family physician or the after hours number for the clinic or go to the Emergency Department.   BELOW ARE SYMPTOMS THAT SHOULD BE REPORTED IMMEDIATELY:  *FEVER GREATER THAN 101.0 F  *CHILLS WITH OR WITHOUT FEVER  NAUSEA AND VOMITING THAT IS NOT CONTROLLED WITH YOUR NAUSEA MEDICATION  *UNUSUAL SHORTNESS OF BREATH  *UNUSUAL BRUISING OR BLEEDING  TENDERNESS IN MOUTH AND THROAT WITH OR WITHOUT PRESENCE OF ULCERS  *URINARY PROBLEMS  *BOWEL PROBLEMS  UNUSUAL RASH Items with * indicate a potential emergency and should be followed up as soon as possible.  One of the nurses will contact you 24 hours after your treatment. Please let the nurse know about any problems that you may have experienced. Feel free to call the clinic you have any questions or concerns. The clinic phone number is [phone]. Return as scheduled.  I have been informed and understand all the instructions given to me. I know to contact the clinic, my physician, or go to the Emergency Department if any problems should occur. I do not have any questions at this time, but understand that I may call the clinic during office hours or the Patient Navigator at [phone] should I have any questions or need assistance in obtaining follow up care.    __________________________________________  _____________  __________ Signature of Patient or Authorized Representative            Date                   Time    __________________________________________ Nurse's Signature

## 2014-10-11 NOTE — Progress Notes (Signed)
Tolerated well

## 2014-10-12 ENCOUNTER — Telehealth (HOSPITAL_COMMUNITY): Payer: Self-pay | Admitting: *Deleted

## 2014-10-12 NOTE — Telephone Encounter (Signed)
24h follow up call: Patient thought that he was getting a little nauseated earlier today so he took a Zofran and then he began to feel better. Patient has not had any other problems since getting chemo yesterday. Pt was instructed to call us if there were any problems between today and tomorrow (Friday). I will be contacting patient again tomorrow (Friday) to make sure that he is doing ok prior to the weekend.

## 2014-10-13 ENCOUNTER — Telehealth (HOSPITAL_COMMUNITY): Payer: Self-pay | Admitting: *Deleted

## 2014-10-13 NOTE — Telephone Encounter (Signed)
Patient has not had any nausea today. No fever, chills. Some fatigue yesterday but better today. I will call patient back on Monday to check on him.

## 2014-10-16 ENCOUNTER — Telehealth (HOSPITAL_COMMUNITY): Payer: Self-pay | Admitting: *Deleted

## 2014-10-16 NOTE — Telephone Encounter (Signed)
Call made to patient this afternoon. No answer and voicemail not set up on pt's phone.

## 2014-10-17 ENCOUNTER — Encounter: Payer: Self-pay | Admitting: *Deleted

## 2014-10-17 NOTE — Progress Notes (Signed)
[facility] Psychosocial Distress Screening Clinical Social Work  Clinical Social Work was referred by distress screening protocol.  The patient scored a 5 on the Psychosocial Distress Thermometer which indicates moderate distress. Clinical Social Worker phoned pt to assess for distress and other psychosocial needs. CSW and pt have been calling and leaving each other messages for the last few days. CSW left message with his mother and attempted to reach him on his cell phone as well, but he does not have a vm set up to date. Pt also left message regarding food concerns. CSW made referral to Lendell Caprice, Financial Advocate to assist with this concern as well. CSW plans to meet with pt at his appointment tomorrow to further assess needs and check in.   ONCBCN DISTRESS SCREENING 10/11/2014  Screening Type Initial Screening  Elta Gu[place] the number that describes how much distress you have been experiencing in the past week 5  Practical problem type Food  Emotional problem type Nervousness/Anxiety;Depression;Adjusting to illness  Physical Problem type Pain  Physician notified of physical symptoms No  Referral to clinical psychology No  Referral to clinical social work Yes  Referral to dietition No  Referral to financial advocate No  Referral to support programs No  Referral to palliative care No    Clinical Social Worker follow up needed: Yes.    If yes, follow up plan:  See above. Loren Racer, [place] Tuesdays 8:30-1pm Wednesdays 8:30-12pm  [phone]

## 2014-10-18 ENCOUNTER — Encounter (HOSPITAL_BASED_OUTPATIENT_CLINIC_OR_DEPARTMENT_OTHER): Payer: Medicaid Other

## 2014-10-18 ENCOUNTER — Encounter: Payer: Self-pay | Admitting: *Deleted

## 2014-10-18 ENCOUNTER — Encounter (HOSPITAL_COMMUNITY): Payer: Self-pay

## 2014-10-18 VITALS — BP 130/74 | HR 86 | Temp 97.4°F | Resp 16 | Wt 180.6 lb

## 2014-10-18 DIAGNOSIS — C711 Malignant neoplasm of frontal lobe: Secondary | ICD-10-CM

## 2014-10-18 DIAGNOSIS — C719 Malignant neoplasm of brain, unspecified: Secondary | ICD-10-CM

## 2014-10-18 DIAGNOSIS — J438 Other emphysema: Secondary | ICD-10-CM

## 2014-10-18 DIAGNOSIS — J449 Chronic obstructive pulmonary disease, unspecified: Secondary | ICD-10-CM

## 2014-10-18 DIAGNOSIS — G40909 Epilepsy, unspecified, not intractable, without status epilepticus: Secondary | ICD-10-CM

## 2014-10-18 MED ORDER — LORATADINE-PSEUDOEPHEDRINE ER 10-240 MG PO TB24: 1.0000 | ORAL_TABLET | Freq: Every day | ORAL | Status: DC

## 2014-10-18 NOTE — Progress Notes (Signed)
[place] Clinical Social Work  Clinical Social Work was referred by patient for assessment of psychosocial needs due to distress screen trigger for anxiety and need for financial assistance.  Clinical Social Worker met with patient at [facility] after his follow up appt. to offer support and assess for needs.  Pt reports his anxiety is related to his cancer diagnosis, progression of his disease and living with his parents currently. Pt has experienced many life changes due to his cancer diagnosis and treatment. Pt now resides with his parents and this has been stressful and challenging for him as he was independent and working prior to his diagnosis. Pt reports he is usually able to work through his anxiety behaviorally, but worries about his cancer several times a day. CSW discussed coping techniques. Pt continues to have financial struggles as he was helping support his parents and now cannot. He has applied for SSI and was approved, but this does not start until November. CSW discussed with pt financial resources to assist. CSW helped pt apply for assistance through [employer] and [employer]. Pt also met with Lendell Caprice for assistance with food card and gas card as well. Pt was very appreciative of all assistance and support. Pt aware to reach out to CSW as needed.     Clinical Social Work interventions: Programme researcher, broadcasting/film/video education Referral to resources- [employer] and [facility] Tuesdays 8:30-1pm Wednesdays 8:30-12pm  [phone]

## 2014-10-18 NOTE — Patient Instructions (Signed)
..  [place] Discharge Instructions  RECOMMENDATIONS MADE BY THE CONSULTANT AND ANY TEST RESULTS WILL BE SENT TO YOUR REFERRING PHYSICIAN.  EXAM FINDINGS BY THE PHYSICIAN TODAY AND SIGNS OR SYMPTOMS TO REPORT TO CLINIC OR PRIMARY PHYSICIAN: Exam and findings as discussed by per Dr. Barnet Glasgow. Notify us of nausea or vomiting not relieved by your meds  MEDICATIONS PRESCRIBED:  Claritin d 24  INSTRUCTIONS/FOLLOW-UP: Cbc diff and cmet next week with chemo  Thank you for choosing [place] to provide your oncology and hematology care.  To afford each patient quality time with our providers, please arrive at least 15 minutes before your scheduled appointment time.  With your help, our goal is to use those 15 minutes to complete the necessary work-up to ensure our physicians have the information they need to help with your evaluation and healthcare recommendations.    Effective January 1st, 2014, we ask that you re-schedule your appointment with our physicians should you arrive 10 or more minutes late for your appointment.  We strive to give you quality time with our providers, and arriving late affects you and other patients whose appointments are after yours.    Again, thank you for choosing [facility].  Our hope is that these requests will decrease the amount of time that you wait before being seen by our physicians.       _____________________________________________________________  Should you have questions after your visit to [facility], please contact our office at [phone] between the hours of 8:30 a.m. and 4:30 p.m.  Voicemails left after 4:30 p.m. will not be returned until the following business day.  For prescription refill requests, have your pharmacy contact our office with your prescription refill request.    _______________________________________________________________  We hope that we have given you very good  care.  You may receive a patient satisfaction survey in the mail, please complete it and return it as soon as possible.  We value your feedback!  _______________________________________________________________  Have you asked about our STAR program?  STAR stands for Survivorship Training and Rehabilitation, and this is a nationally recognized cancer care program that focuses on survivorship and rehabilitation.  Cancer and cancer treatments may cause problems, such as, pain, making you feel tired and keeping you from doing the things that you need or want to do. Cancer rehabilitation can help. Our goal is to reduce these troubling effects and help you have the best quality of life possible.  You may receive a survey from a nurse that asks questions about your current state of health.  Based on the survey results, all eligible patients will be referred to the [facility] program for an evaluation so we can better serve you!  A frequently asked questions sheet is available upon request.

## 2014-10-18 NOTE — Progress Notes (Signed)
[place]  OFFICE PROGRESS NOTE  [name], MD [place] [address]  DIAGNOSIS: Malignant brain tumor  Seizure disorder  Other emphysema  Chief Complaint  Patient presents with  . Follow-up  . GBM    CURRENT THERAPY: Irinotecan/Avastin every 2 weeks. Had been treated with Temodar for 5 days every 28 days for 2 cycles plus leukocyte vaccine for 2 doses at [facility] trial with repeat MRI on 08/23/2014 showing progression of disease. He was started on Avastin 10 mg per kilogram on 09/27/2014 with plans to continue Avastin every 2 weeks together with irinotecan 125 mg per meter squared every 2 weeks.   INTERVAL HISTORY: Cody Buchanan 58 y.o. male returns for followup one week after initiation of irinotecan plus Avastin therapy for recurrent glioblastoma having received Avastin alone 3 weeks ago.  He denies any nausea or vomiting after treatment. Does have headache with sinus congestion without earache or sore throat. Denies any PND, orthopnea, palpitations, and has had constipation with no episodes of diarrhea. He denies any lower extremity swelling or redness, abdominal pain, fever, night sweats, blurred or double vision or focal weakness.  MEDICAL HISTORY: Past Medical History  Diagnosis Date  . Glioblastoma   . GERD (gastroesophageal reflux disease)   . Seizures   . Anxiety 09/07/2014    INTERIM HISTORY: has Malignant brain tumor, left frontal glioblastoma multiforme grade 4; Chronic obstructive pulmonary disease; Seizure disorder; Allergic rhinitis; Acute gouty arthritis; and Anxiety on his problem list.   Oncology History   03/03/2014 Partial resection, [name], [place], Dr. [name] 04/07/2014 Re-resection at [place], Dr. [name]     Malignant brain tumor, left frontal glioblastoma multiforme grade 4   03/03/2014 Initial Diagnosis Malignant brain tumor, left frontal glioblastoma  multiforme grade 4, [place], [place]. partial resection, Dr. [name]   04/07/2014 Surgery Re-resection at [place], Dr. Derrill Memo   05/10/2014 - 06/21/2014 Radiation Therapy By Dr. Sondra Come with 6000 cGy in 30 fractions to left frontal brain   07/07/2014 -  Chemotherapy Temodar x5 days every 4 weeks for 2 cycles +2 infusions of patient's own lymphocytes.   08/23/2014 Progression MRI showed progression at [facility]   09/27/2014 -  Chemotherapy Avastin started with irinotecan added on 10/714/2015      ALLERGIES:  has No Known Allergies.  MEDICATIONS: has a current medication list which includes the following prescription(s): acetaminophen, bisacodyl, diazepam, ibuprofen, levetiracetam, lidocaine-prilocaine, multivitamin with minerals, oxycodone, albuterol, loperamide, loratadine-pseudoephedrine, ondansetron, ondansetron, tamsulosin, and tiotropium.  SURGICAL HISTORY:  Past Surgical History  Procedure Laterality Date  . Salivary gland surgery    . Brain tumor excision      FAMILY HISTORY: family history includes Cancer in his mother; Diabetes in his father and mother; Hypertension in his mother; Stroke in his mother.  SOCIAL HISTORY:  reports that he has been smoking Cigarettes.  He has been smoking about 0.00 packs per day. He has never used smokeless tobacco. He reports that he does not drink alcohol or use illicit drugs.  REVIEW OF SYSTEMS:  Other than that discussed above is noncontributory.  PHYSICAL EXAMINATION: ECOG PERFORMANCE STATUS: 1 - Symptomatic but completely ambulatory  Blood pressure 130/74, pulse 86, temperature 97.4 F (36.3 C), resp. rate 16, weight 180 lb 9.6 oz (81.92 kg), SpO2 100.00%.  GENERAL:alert, no distress and comfortable SKIN: skin color, texture, turgor are normal, no rashes or significant lesions  EYES: PERLA; Conjunctiva are pink and non-injected, sclera clear SINUSES: Tenderness over the left maxillary sinus. OROPHARYNX:no  exudate, no erythema on lips, buccal mucosa, or tongue. NECK: supple, thyroid normal size, non-tender, without nodularity. No masses CHEST: Increased AP diameter with no breast masses. LifePort in place in right anterior chest. LYMPH:  no palpable lymphadenopathy in the cervical, axillary or inguinal LUNGS: clear to auscultation and percussion with normal breathing effort HEART: regular rate & rhythm and no murmurs. ABDOMEN:abdomen soft, non-tender and normal bowel sounds MUSCULOSKELETAL:no cyanosis of digits and no clubbing. Range of motion normal.  NEURO: alert & oriented x 3 with fluent speech, no focal motor/sensory deficits   LABORATORY DATA: Infusion on 10/11/2014  Component Date Value Ref Range Status  . Specific Gravity, Urine 10/11/2014 1.010  1.005 - 1.030 Final  . pH 10/11/2014 6.0  5.0 - 8.0 Final  . Glucose, UA 10/11/2014 NEGATIVE  NEGATIVE mg/dL Final  . Hgb urine dipstick 10/11/2014 NEGATIVE  NEGATIVE Final  . Bilirubin Urine 10/11/2014 NEGATIVE  NEGATIVE Final  . Ketones, ur 10/11/2014 NEGATIVE  NEGATIVE mg/dL Final  . Protein, ur 10/11/2014 NEGATIVE  NEGATIVE mg/dL Final  . Urobilinogen, UA 10/11/2014 0.2  0.0 - 1.0 mg/dL Final  . Nitrite 10/11/2014 NEGATIVE  NEGATIVE Final  . Leukocytes, UA 10/11/2014 NEGATIVE  NEGATIVE Final  Hospital Outpatient Visit on 10/10/2014  Component Date Value Ref Range Status  . WBC 10/10/2014 11.2* 4.0 - 10.5 K/uL Final  . RBC 10/10/2014 5.31  4.22 - 5.81 MIL/uL Final  . Hemoglobin 10/10/2014 15.8  13.0 - 17.0 g/dL Final  . HCT 10/10/2014 45.7  39.0 - 52.0 % Final  . MCV 10/10/2014 86.1  78.0 - 100.0 fL Final  . MCH 10/10/2014 29.8  26.0 - 34.0 pg Final  . MCHC 10/10/2014 34.6  30.0 - 36.0 g/dL Final  . RDW 10/10/2014 14.3  11.5 - 15.5 % Final  . Platelets 10/10/2014 183  150 - 400 K/uL Final  . Neutrophils Relative % 10/10/2014 82* 43 - 77 % Final  . Neutro Abs 10/10/2014 9.2* 1.7 - 7.7 K/uL Final  . Lymphocytes Relative 10/10/2014  13  12 - 46 % Final  . Lymphs Abs 10/10/2014 1.4  0.7 - 4.0 K/uL Final  . Monocytes Relative 10/10/2014 4  3 - 12 % Final  . Monocytes Absolute 10/10/2014 0.5  0.1 - 1.0 K/uL Final  . Eosinophils Relative 10/10/2014 1  0 - 5 % Final  . Eosinophils Absolute 10/10/2014 0.1  0.0 - 0.7 K/uL Final  . Basophils Relative 10/10/2014 0  0 - 1 % Final  . Basophils Absolute 10/10/2014 0.0  0.0 - 0.1 K/uL Final  . Prothrombin Time 10/10/2014 13.2  11.6 - 15.2 seconds Final  . INR 10/10/2014 0.99  0.00 - 1.49 Final  Office Visit on 10/09/2014  Component Date Value Ref Range Status  . WBC 10/09/2014 10.7* 4.0 - 10.5 K/uL Final  . RBC 10/09/2014 4.79  4.22 - 5.81 MIL/uL Final  . Hemoglobin 10/09/2014 14.0  13.0 - 17.0 g/dL Final  . HCT 10/09/2014 40.5  39.0 - 52.0 % Final  . MCV 10/09/2014 84.6  78.0 - 100.0 fL Final  . MCH 10/09/2014 29.2  26.0 - 34.0 pg Final  . MCHC 10/09/2014 34.6  30.0 - 36.0 g/dL Final  . RDW 10/09/2014 14.2  11.5 - 15.5 % Final  . Platelets 10/09/2014 196  150 - 400 K/uL Final   PLATELET CLUMPS NOTED ON SMEAR, COUNT APPEARS ADEQUATE  .  Neutrophils Relative % 10/09/2014 76  43 - 77 % Final  . Neutro Abs 10/09/2014 8.2* 1.7 - 7.7 K/uL Final  . Lymphocytes Relative 10/09/2014 19  12 - 46 % Final  . Lymphs Abs 10/09/2014 2.0  0.7 - 4.0 K/uL Final  . Monocytes Relative 10/09/2014 4  3 - 12 % Final  . Monocytes Absolute 10/09/2014 0.4  0.1 - 1.0 K/uL Final  . Eosinophils Relative 10/09/2014 1  0 - 5 % Final  . Eosinophils Absolute 10/09/2014 0.1  0.0 - 0.7 K/uL Final  . Basophils Relative 10/09/2014 0  0 - 1 % Final  . Basophils Absolute 10/09/2014 0.0  0.0 - 0.1 K/uL Final  . WBC Morphology 10/09/2014 SMUDGE CELLS   Final   ATYPICAL LYMPHOCYTES  . Sodium 10/09/2014 139  137 - 147 mEq/L Final  . Potassium 10/09/2014 3.7  3.7 - 5.3 mEq/L Final  . Chloride 10/09/2014 104  96 - 112 mEq/L Final  . CO2 10/09/2014 21  19 - 32 mEq/L Final  . Glucose, Bld 10/09/2014 95  70 - 99 mg/dL  Final  . BUN 10/09/2014 22  6 - 23 mg/dL Final  . Creatinine, Ser 10/09/2014 1.24  0.50 - 1.35 mg/dL Final  . Calcium 10/09/2014 8.7  8.4 - 10.5 mg/dL Final  . Total Protein 10/09/2014 6.8  6.0 - 8.3 g/dL Final  . Albumin 10/09/2014 3.3* 3.5 - 5.2 g/dL Final  . AST 10/09/2014 15  0 - 37 U/L Final  . ALT 10/09/2014 18  0 - 53 U/L Final  . Alkaline Phosphatase 10/09/2014 72  39 - 117 U/L Final  . Total Bilirubin 10/09/2014 0.2* 0.3 - 1.2 mg/dL Final  . GFR calc non Af Amer 10/09/2014 63* >90 mL/min Final  . GFR calc Af Amer 10/09/2014 73* >90 mL/min Final   Comment: (NOTE)                          The eGFR has been calculated using the CKD EPI equation.                          This calculation has not been validated in all clinical situations.                          eGFR's persistently <90 mL/min signify possible Chronic Kidney                          Disease.  . Anion gap 10/09/2014 14  5 - 15 Final  . LDH 10/09/2014 140  94 - 250 U/L Final   SLIGHT HEMOLYSIS    PATHOLOGY: No new pathology.  Urinalysis    Component Value Date/Time   LABSPEC 1.010 10/11/2014 1048   PHURINE 6.0 10/11/2014 1048   GLUCOSEU NEGATIVE 10/11/2014 1048   HGBUR NEGATIVE 10/11/2014 1048   BILIRUBINUR NEGATIVE 10/11/2014 1048   KETONESUR NEGATIVE 10/11/2014 1048   PROTEINUR NEGATIVE 10/11/2014 1048   UROBILINOGEN 0.2 10/11/2014 1048   NITRITE NEGATIVE 10/11/2014 1048   LEUKOCYTESUR NEGATIVE 10/11/2014 1048    RADIOGRAPHIC STUDIES: Ir Fluoro Guide Cv Line Right  10/10/2014   CLINICAL DATA:  Recurrent glioblastoma and need for porta cath for chemotherapy.  EXAM: IMPLANTED PORT A CATH PLACEMENT WITH ULTRASOUND AND FLUOROSCOPIC GUIDANCE  ANESTHESIA/SEDATION: 4.0 Mg IV Versed; 100 mcg IV Fentanyl  Total Moderate Sedation  Time:  35 minutes  Additional Medications: 2 g IV Ancef. As antibiotic prophylaxis, Ancef was ordered pre-procedure and administered intravenously within one hour of incision.   FLUOROSCOPY TIME:  18 seconds.  PROCEDURE: The procedure, risks, benefits, and alternatives were explained to the patient. Questions regarding the procedure were encouraged and answered. The patient understands and consents to the procedure.  The right neck and chest were prepped with chlorhexidine in a sterile fashion, and a sterile drape was applied covering the operative field. Maximum barrier sterile technique with sterile gowns and gloves were used for the procedure. A time-out was performed. Local anesthesia was provided with 1% lidocaine.  Ultrasound was used to confirm patency of the right internal jugular vein. After creating a small venotomy incision, a 21 gauge needle was advanced into the right internal jugular vein under direct, real-time ultrasound guidance. Ultrasound image documentation was performed. After securing guidewire access, an 8 Fr dilator was placed. A J-wire was kinked to measure appropriate catheter length.  A subcutaneous port pocket was then created along the upper chest wall utilizing sharp and blunt dissection. Portable cautery was utilized. The pocket was irrigated with sterile saline.  A single lumen power injectable port was chosen for placement. The 8 Fr catheter was tunneled from the port pocket site to the venotomy incision. The port was placed in the pocket. External catheter was trimmed to appropriate length based on guidewire measurement.  At the venotomy, an 8 Fr peel-away sheath was placed over a guidewire. The catheter was then placed through the sheath and the sheath removed. Final catheter positioning was confirmed and documented with a fluoroscopic spot image. The port was accessed with a needle and aspirated and flushed with heparinized saline. The needle was removed.  The venotomy and port pocket incisions were closed with subcutaneous 3-0 Monocryl and subcuticular 4-0 Vicryl. Dermabond was applied to both incisions.  COMPLICATIONS: None  FINDINGS: After catheter  placement, the tip lies at the cavoatrial junction. The catheter aspirates normally and is ready for immediate use.  IMPRESSION: Placement of single lumen port a cath via right internal jugular vein. The catheter tip lies at the cavoatrial junction. A power injectable port a cath was placed and is ready for immediate use.   Electronically Signed   By: Irish Lack M.D.   On: 10/10/2014 14:22   Ir US Guide Vasc Access Right  10/10/2014   CLINICAL DATA:  Recurrent glioblastoma and need for porta cath for chemotherapy.  EXAM: IMPLANTED PORT A CATH PLACEMENT WITH ULTRASOUND AND FLUOROSCOPIC GUIDANCE  ANESTHESIA/SEDATION: 4.0 Mg IV Versed; 100 mcg IV Fentanyl  Total Moderate Sedation Time:  35 minutes  Additional Medications: 2 g IV Ancef. As antibiotic prophylaxis, Ancef was ordered pre-procedure and administered intravenously within one hour of incision.  FLUOROSCOPY TIME:  18 seconds.  PROCEDURE: The procedure, risks, benefits, and alternatives were explained to the patient. Questions regarding the procedure were encouraged and answered. The patient understands and consents to the procedure.  The right neck and chest were prepped with chlorhexidine in a sterile fashion, and a sterile drape was applied covering the operative field. Maximum barrier sterile technique with sterile gowns and gloves were used for the procedure. A time-out was performed. Local anesthesia was provided with 1% lidocaine.  Ultrasound was used to confirm patency of the right internal jugular vein. After creating a small venotomy incision, a 21 gauge needle was advanced into the right internal jugular vein under direct, real-time ultrasound guidance. Ultrasound image documentation  was performed. After securing guidewire access, an 8 Fr dilator was placed. A J-wire was kinked to measure appropriate catheter length.  A subcutaneous port pocket was then created along the upper chest wall utilizing sharp and blunt dissection. Portable cautery  was utilized. The pocket was irrigated with sterile saline.  A single lumen power injectable port was chosen for placement. The 8 Fr catheter was tunneled from the port pocket site to the venotomy incision. The port was placed in the pocket. External catheter was trimmed to appropriate length based on guidewire measurement.  At the venotomy, an 8 Fr peel-away sheath was placed over a guidewire. The catheter was then placed through the sheath and the sheath removed. Final catheter positioning was confirmed and documented with a fluoroscopic spot image. The port was accessed with a needle and aspirated and flushed with heparinized saline. The needle was removed.  The venotomy and port pocket incisions were closed with subcutaneous 3-0 Monocryl and subcuticular 4-0 Vicryl. Dermabond was applied to both incisions.  COMPLICATIONS: None  FINDINGS: After catheter placement, the tip lies at the cavoatrial junction. The catheter aspirates normally and is ready for immediate use.  IMPRESSION: Placement of single lumen port a cath via right internal jugular vein. The catheter tip lies at the cavoatrial junction. A power injectable port a cath was placed and is ready for immediate use.   Electronically Signed   By: Aletta Edouard M.D.   On: 10/10/2014 14:22    ASSESSMENT:  #1. Recurrent glioblastoma multiforme left frontal lobe, status post Avastin on 09/27/2014 with plans to deliver Avastin plus Irinotecan every 2 weeks, status post Avastin/irinotecan on 10/11/2014 with good tolerance  #2. Chronic obstructive pulmonary disease, still smoking.  #3. Seizure disorder, on treatment.  #4. Gout, controlled.  #5. Benign prostatic hypertrophy, on treatment with Flomax.  #6. Anxiety neurosis, controlled with diazepam.  #7. Symptomatic allergic rhinitis.      PLAN:  #1. Claritin-D 24 one daily. #2. Continue oxycodone and Valium. #3. Repeat MRI prior to next visit to [facility] at the end of November 2015. #4. Social  service evaluation today #5. Followup on 10/25/2014 with CBC, chem profile, and additional irinotecan/Avastin.   All questions were answered. The patient knows to call the clinic with any problems, questions or concerns. We can certainly see the patient much sooner if necessary.   I spent 25 minutes counseling the patient face to face. The total time spent in the appointment was 30 minutes.    Doroteo Bradford, MD 10/18/2014 9:43 AM  DISCLAIMER:  This note was dictated with voice recognition software.  Similar sounding words can inadvertently be transcribed inaccurately and may not be corrected upon review.

## 2014-10-22 ENCOUNTER — Encounter (HOSPITAL_COMMUNITY): Payer: Self-pay | Admitting: Emergency Medicine

## 2014-10-22 ENCOUNTER — Emergency Department (HOSPITAL_COMMUNITY)
Admission: EM | Admit: 2014-10-22 | Discharge: 2014-10-22 | Disposition: A | Discharge status: Home / Self Care | Payer: Medicaid Other | Attending: Emergency Medicine | Admitting: Emergency Medicine

## 2014-10-22 DIAGNOSIS — Z720 Tobacco use: Secondary | ICD-10-CM | POA: Diagnosis not present

## 2014-10-22 DIAGNOSIS — J0100 Acute maxillary sinusitis, unspecified: Secondary | ICD-10-CM | POA: Diagnosis not present

## 2014-10-22 DIAGNOSIS — H9202 Otalgia, left ear: Secondary | ICD-10-CM | POA: Diagnosis not present

## 2014-10-22 DIAGNOSIS — Z79899 Other long term (current) drug therapy: Secondary | ICD-10-CM | POA: Diagnosis not present

## 2014-10-22 DIAGNOSIS — Z85841 Personal history of malignant neoplasm of brain: Secondary | ICD-10-CM | POA: Diagnosis not present

## 2014-10-22 DIAGNOSIS — F419 Anxiety disorder, unspecified: Secondary | ICD-10-CM | POA: Diagnosis not present

## 2014-10-22 DIAGNOSIS — G40909 Epilepsy, unspecified, not intractable, without status epilepticus: Secondary | ICD-10-CM | POA: Insufficient documentation

## 2014-10-22 DIAGNOSIS — Z8719 Personal history of other diseases of the digestive system: Secondary | ICD-10-CM | POA: Diagnosis not present

## 2014-10-22 DIAGNOSIS — R51 Headache: Secondary | ICD-10-CM | POA: Diagnosis present

## 2014-10-22 DIAGNOSIS — J320 Chronic maxillary sinusitis: Secondary | ICD-10-CM

## 2014-10-22 MED ORDER — OXYCODONE-ACETAMINOPHEN 5-325 MG PO TABS: 2.0000 | ORAL_TABLET | ORAL | Status: DC | PRN

## 2014-10-22 MED ORDER — AMOXICILLIN-POT CLAVULANATE 875-125 MG PO TABS
1.0000 | ORAL_TABLET | Freq: Once | ORAL | Status: AC
  Administered 2014-10-22: 1 via ORAL
  Filled 2014-10-22: qty 1

## 2014-10-22 MED ORDER — AMOXICILLIN-POT CLAVULANATE 875-125 MG PO TABS: 1.0000 | ORAL_TABLET | Freq: Two times a day (BID) | ORAL | Status: DC

## 2014-10-22 NOTE — Discharge Instructions (Signed)
Prescription for antibiotic and pain medication. Follow-up your doctor.

## 2014-10-22 NOTE — ED Notes (Signed)
Discharge instructions given, pt demonstrated teach back and verbal understanding. No concerns voiced.  

## 2014-10-22 NOTE — ED Notes (Signed)
Pt here for "sinus issues", c/o sore throat, ear pain, left facial pain, pt with brain tumor and has started chemo recently

## 2014-10-22 NOTE — ED Provider Notes (Signed)
CSN: [id number]     Arrival date & time 10/22/14  1825 History  This chart was scribed for Nat Christen, MD by Molli Posey, ED Scribe. This patient was seen in room APA02/APA02 and the patient's care was started 8:41 PM.    Chief Complaint  Patient presents with  . Facial Pain   The history is provided by the patient. No language interpreter was used.   HPI Comments: Cody Buchanan is a 58 y.o. male with a history of glioblastoma who presents to the Emergency Department complaining of facial pain that started about 3 days ago. Patient reports he is on his second round of chemotherapy and his last treatment was 4 days ago. Pt is on a trial treatment at [facility]. Patient is complaining of "sinus issues" including sore throat, left ear pain, left facial pain that started early this morning. Pt states the tumor is in the left frontal area. He says he thinks his left ear is infected.   PCP [place]   Past Medical History  Diagnosis Date  . Glioblastoma   . GERD (gastroesophageal reflux disease)   . Seizures   . Anxiety 09/07/2014   Past Surgical History  Procedure Laterality Date  . Salivary gland surgery    . Brain tumor excision     Family History  Problem Relation Age of Onset  . Cancer Mother   . Diabetes Mother   . Hypertension Mother   . Stroke Mother   . Diabetes Father    History  Substance Use Topics  . Smoking status: Current Every Day Smoker    Types: Cigarettes  . Smokeless tobacco: Never Used  . Alcohol Use: No    Review of Systems  HENT: Positive for congestion, ear pain and sore throat.   All other systems reviewed and are negative.   Allergies  Review of patient's allergies indicates no known allergies.  Home Medications   Prior to Admission medications   Medication Sig Start Date End Date Taking? Authorizing Provider  acetaminophen (TYLENOL) 325 MG tablet Take 650 mg by mouth every 6 (six) hours as needed for mild pain.  03/06/14  Yes Historical  Provider, MD  diazepam (VALIUM) 10 MG tablet Take 10 mg by mouth daily. 10/10/14  Yes Farrel Gobble, MD  ibuprofen (ADVIL,MOTRIN) 200 MG tablet Take 400 mg by mouth every 6 (six) hours as needed for mild pain. Pain   Yes Historical Provider, MD  levETIRAcetam (KEPPRA) 1000 MG tablet Take 1,000 mg by mouth 2 (two) times daily. 02/22/14  Yes Historical Provider, MD  lidocaine-prilocaine (EMLA) cream Apply a quarter size amount to port site 1 hour prior to chemo. Do not rub in. Cover with plastic wrap. 10/10/14  Yes Farrel Gobble, MD  loperamide (IMODIUM) 2 MG capsule Take 2 mg by mouth as needed for diarrhea or loose stools. Take 2 tablets after first loose stool and then 1 tablet every 2 hours until you have gone 12 hours without a loose stool. If you develop loose stools during the night, take 2 tablets every 4 hours. Contact the [facility] for uncontrolled loose stools. 10/10/14  Yes Farrel Gobble, MD  loratadine-pseudoephedrine (CLARITIN-D 24-HOUR) 10-240 MG per 24 hr tablet Take 1 tablet by mouth daily as needed for allergies. 10/18/14  Yes Farrel Gobble, MD  Multiple Vitamins-Minerals (MULTIVITAMIN WITH MINERALS) tablet Take 1 tablet by mouth daily.   Yes Historical Provider, MD  ondansetron (ZOFRAN-ODT) 8 MG disintegrating tablet Take 8 mg by mouth every 8 (  eight) hours as needed for nausea or vomiting. 04/21/14  Yes Farrel Gobble, MD  albuterol (PROAIR HFA) 108 (90 BASE) MCG/ACT inhaler Inhale 1-2 puffs into the lungs every 6 (six) hours as needed.  02/22/14   Historical Provider, MD  amoxicillin-clavulanate (AUGMENTIN) 875-125 MG per tablet Take 1 tablet by mouth 2 (two) times daily. One po bid x 7 days 10/22/14   Nat Christen, MD  bisacodyl (DULCOLAX) 5 MG EC tablet Take 5 mg by mouth daily as needed for moderate constipation.    Historical Provider, MD  oxycodone (OXY-IR) 5 MG capsule Take 1-2 capsules (5-10 mg total) by mouth every 6 (six) hours as needed for pain. Take 1 or 2  tablets up to every 4 hours as needed for pain or headache. 10/25/14   Farrel Gobble, MD  oxyCODONE-acetaminophen (PERCOCET) 5-325 MG per tablet Take 2 tablets by mouth every 4 (four) hours as needed. 10/22/14   Nat Christen, MD  oxyCODONE-acetaminophen (PERCOCET) 5-325 MG per tablet Take 2 tablets by mouth every 4 (four) hours as needed. 10/22/14   Nat Christen, MD   BP 143/104  Pulse 86  Temp(Src) 98.1 F (36.7 C) (Oral)  Resp 20  Ht 5\' 9"  (1.753 m)  Wt 185 lb (83.915 kg)  BMI 27.31 kg/m2  SpO2 98% Physical Exam  Nursing note and vitals reviewed. Constitutional: He is oriented to person, place, and time. He appears well-developed and well-nourished.  HENT:  Head: Normocephalic and atraumatic.  Tender in left maxillary sinus.   Eyes: Conjunctivae and EOM are normal. Pupils are equal, round, and reactive to light.  Neck: Normal range of motion. Neck supple.  Cardiovascular: Normal rate, regular rhythm and normal heart sounds.   Pulmonary/Chest: Effort normal and breath sounds normal.  Abdominal: Soft. Bowel sounds are normal.  Musculoskeletal: Normal range of motion.  Neurological: He is alert and oriented to person, place, and time.  Skin: Skin is warm and dry.  Psychiatric: He has a normal mood and affect. His behavior is normal.    ED Course  Procedures  DIAGNOSTIC STUDIES: Oxygen Saturation is 98% on RA, normal by my interpretation.    COORDINATION OF CARE: 8:49 PM Discussed treatment plan with pt at bedside and pt agreed to plan. Will order Abx - Augmentin.    Labs Review Labs Reviewed - No data to display  Imaging Review No results found.   EKG Interpretation None      MDM   Final diagnoses:  Left maxillary sinusitis    No neurological deficits. Patient has close follow-up for his glioblastoma. Will Rx for left maxillary sinusitis. Rx Augmentin 875/125 and Percocet for pain.     Nat Christen, MD 10/26/14 [phone]

## 2014-10-22 NOTE — ED Notes (Signed)
Being treated for Brain tumor, last chemo 10.12.15, c/o pain to left sinus at this time. Minimal drainage, pain increasing with pressure behind eyes. Per pt.

## 2014-10-22 NOTE — ED Notes (Signed)
Last chemo treatment on Wednesday, per pt pt is on trial treatment at [facility]

## 2014-10-23 ENCOUNTER — Inpatient Hospital Stay (HOSPITAL_COMMUNITY): Payer: 59

## 2014-10-23 ENCOUNTER — Telehealth (HOSPITAL_COMMUNITY): Payer: Self-pay | Admitting: *Deleted

## 2014-10-23 MED FILL — Oxycodone w/ Acetaminophen Tab 5-325 MG: ORAL | Qty: 6 | Status: AC

## 2014-10-23 NOTE — Telephone Encounter (Signed)
Patient went to ED last night for left ear pain, lt sinus pressure, and a sore throat. They prescribed him Augmentin. Patient called today to make sure that taking the antibiotic Augmentin was ok. I told him yes. He said ok.

## 2014-10-25 ENCOUNTER — Encounter (HOSPITAL_BASED_OUTPATIENT_CLINIC_OR_DEPARTMENT_OTHER): Payer: Medicaid Other

## 2014-10-25 ENCOUNTER — Encounter (HOSPITAL_COMMUNITY): Payer: Self-pay

## 2014-10-25 ENCOUNTER — Encounter: Payer: Self-pay | Admitting: *Deleted

## 2014-10-25 VITALS — BP 132/79 | HR 101 | Temp 97.6°F | Resp 18 | Wt 181.6 lb

## 2014-10-25 DIAGNOSIS — J438 Other emphysema: Secondary | ICD-10-CM

## 2014-10-25 DIAGNOSIS — G4089 Other seizures: Secondary | ICD-10-CM

## 2014-10-25 DIAGNOSIS — C711 Malignant neoplasm of frontal lobe: Secondary | ICD-10-CM

## 2014-10-25 DIAGNOSIS — C719 Malignant neoplasm of brain, unspecified: Secondary | ICD-10-CM | POA: Diagnosis not present

## 2014-10-25 DIAGNOSIS — Z5111 Encounter for antineoplastic chemotherapy: Secondary | ICD-10-CM

## 2014-10-25 DIAGNOSIS — G40909 Epilepsy, unspecified, not intractable, without status epilepticus: Secondary | ICD-10-CM

## 2014-10-25 DIAGNOSIS — J449 Chronic obstructive pulmonary disease, unspecified: Secondary | ICD-10-CM

## 2014-10-25 DIAGNOSIS — Z5112 Encounter for antineoplastic immunotherapy: Secondary | ICD-10-CM

## 2014-10-25 LAB — CBC WITH DIFFERENTIAL/PLATELET
BASOS ABS: 0 10*3/uL (ref 0.0–0.1)
BASOS PCT: 1 % (ref 0–1)
Eosinophils Absolute: 0.2 10*3/uL (ref 0.0–0.7)
Eosinophils Relative: 3 % (ref 0–5)
HEMATOCRIT: 40.6 % (ref 39.0–52.0)
Hemoglobin: 13.8 g/dL (ref 13.0–17.0)
Lymphocytes Relative: 19 % (ref 12–46)
Lymphs Abs: 1 10*3/uL (ref 0.7–4.0)
MCH: 28.9 pg (ref 26.0–34.0)
MCHC: 34 g/dL (ref 30.0–36.0)
MCV: 85.1 fL (ref 78.0–100.0)
Monocytes Absolute: 0.3 10*3/uL (ref 0.1–1.0)
Monocytes Relative: 6 % (ref 3–12)
NEUTROS ABS: 3.9 10*3/uL (ref 1.7–7.7)
Neutrophils Relative %: 71 % (ref 43–77)
Platelets: 235 10*3/uL (ref 150–400)
RBC: 4.77 MIL/uL (ref 4.22–5.81)
RDW: 14.6 % (ref 11.5–15.5)
WBC: 5.5 10*3/uL (ref 4.0–10.5)

## 2014-10-25 LAB — COMPREHENSIVE METABOLIC PANEL
ALBUMIN: 3.3 g/dL — AB (ref 3.5–5.2)
ALT: 22 U/L (ref 0–53)
ANION GAP: 13 (ref 5–15)
AST: 17 U/L (ref 0–37)
Alkaline Phosphatase: 74 U/L (ref 39–117)
BUN: 13 mg/dL (ref 6–23)
CALCIUM: 9.3 mg/dL (ref 8.4–10.5)
CHLORIDE: 105 meq/L (ref 96–112)
CO2: 24 mEq/L (ref 19–32)
Creatinine, Ser: 1.08 mg/dL (ref 0.50–1.35)
GFR calc Af Amer: 86 mL/min — ABNORMAL LOW (ref >90)
GFR calc non Af Amer: 74 mL/min — ABNORMAL LOW (ref >90)
Glucose, Bld: 118 mg/dL — ABNORMAL HIGH (ref 70–99)
Potassium: 3.9 mEq/L (ref 3.7–5.3)
Sodium: 142 mEq/L (ref 137–147)
Total Bilirubin: 0.4 mg/dL (ref 0.3–1.2)
Total Protein: 7.2 g/dL (ref 6.0–8.3)

## 2014-10-25 MED ORDER — IRINOTECAN HCL CHEMO INJECTION 100 MG/5ML
125.0000 mg/m2 | Freq: Once | INTRAVENOUS | Status: AC
  Administered 2014-10-25: 252 mg via INTRAVENOUS
  Filled 2014-10-25: qty 4.2

## 2014-10-25 MED ORDER — SODIUM CHLORIDE 0.9 % IJ SOLN: 10.0000 mL | INTRAMUSCULAR | Status: DC | PRN

## 2014-10-25 MED ORDER — HEPARIN SOD (PORK) LOCK FLUSH 100 UNIT/ML IV SOLN
500.0000 [IU] | Freq: Once | INTRAVENOUS | Status: AC | PRN
  Administered 2014-10-25: 500 [IU]
  Filled 2014-10-25: qty 5

## 2014-10-25 MED ORDER — SODIUM CHLORIDE 0.9 % IV SOLN
10.0000 mg/kg | Freq: Once | INTRAVENOUS | Status: AC
  Administered 2014-10-25: 850 mg via INTRAVENOUS
  Filled 2014-10-25: qty 30

## 2014-10-25 MED ORDER — SODIUM CHLORIDE 0.9 % IV SOLN: Freq: Once | INTRAVENOUS | Status: DC

## 2014-10-25 MED ORDER — SODIUM CHLORIDE 0.9 % IV SOLN
Freq: Once | INTRAVENOUS | Status: AC
  Administered 2014-10-25: 8 mg via INTRAVENOUS
  Filled 2014-10-25: qty 4

## 2014-10-25 MED ORDER — OXYCODONE HCL 5 MG PO CAPS: 5.0000 mg | ORAL_CAPSULE | Freq: Four times a day (QID) | ORAL | Status: DC | PRN

## 2014-10-25 MED ORDER — SODIUM CHLORIDE 0.9 % IV SOLN
Freq: Once | INTRAVENOUS | Status: AC
  Administered 2014-10-25: 09:00:00 via INTRAVENOUS

## 2014-10-25 NOTE — Patient Instructions (Signed)
[facility] Discharge Instructions  RECOMMENDATIONS MADE BY THE CONSULTANT AND ANY TEST RESULTS WILL BE SENT TO YOUR REFERRING PHYSICIAN.  EXAM FINDINGS BY THE PHYSICIAN TODAY AND SIGNS OR SYMPTOMS TO REPORT TO CLINIC OR PRIMARY PHYSICIAN: Exam and findings as discussed by Dr. Barnet Glasgow.  MEDICATIONS PRESCRIBED:   Oxycodone  INSTRUCTIONS/FOLLOW-UP:  You received Avastin and irinotecan today;  Return in 2 weeks for office visit and treatment.  Thank you for choosing [facility] to provide your oncology and hematology care.  To afford each patient quality time with our providers, please arrive at least 15 minutes before your scheduled appointment time.  With your help, our goal is to use those 15 minutes to complete the necessary work-up to ensure our physicians have the information they need to help with your evaluation and healthcare recommendations.    Effective January 1st, 2014, we ask that you re-schedule your appointment with our physicians should you arrive 10 or more minutes late for your appointment.  We strive to give you quality time with our providers, and arriving late affects you and other patients whose appointments are after yours.    Again, thank you for choosing [facility].  Our hope is that these requests will decrease the amount of time that you wait before being seen by our physicians.       _____________________________________________________________  Should you have questions after your visit to [facility], please contact our office at [phone] between the hours of 8:30 a.m. and 4:30 p.m.  Voicemails left after 4:30 p.m. will not be returned until the following business day.  For prescription refill requests, have your pharmacy contact our office with your prescription refill request.    _______________________________________________________________  We hope that we have given you very good care.   You may receive a patient satisfaction survey in the mail, please complete it and return it as soon as possible.  We value your feedback!  _______________________________________________________________  Have you asked about our STAR program?  STAR stands for Survivorship Training and Rehabilitation, and this is a nationally recognized cancer care program that focuses on survivorship and rehabilitation.  Cancer and cancer treatments may cause problems, such as, pain, making you feel tired and keeping you from doing the things that you need or want to do. Cancer rehabilitation can help. Our goal is to reduce these troubling effects and help you have the best quality of life possible.  You may receive a survey from a nurse that asks questions about your current state of health.  Based on the survey results, all eligible patients will be referred to the [facility] program for an evaluation so we can better serve you!  A frequently asked questions sheet is available upon request. Irinotecan injection What is this medicine? IRINOTECAN (ir in oh TEE kan ) is a chemotherapy drug. It is used to treat colon and rectal cancer. This medicine may be used for other purposes; ask your health care provider or pharmacist if you have questions. COMMON BRAND NAME(S): Camptosar What should I tell my health care provider before I take this medicine? They need to know if you have any of these conditions: -blood disorders -dehydration -diarrhea -infection (especially a virus infection such as chickenpox, cold sores, or herpes) -liver disease -low blood counts, like low white cell, platelet, or red cell counts -recent or ongoing radiation therapy -an unusual or allergic reaction to irinotecan, sorbitol, other chemotherapy, other medicines, foods, dyes,  or preservatives -pregnant or trying to get pregnant -breast-feeding How should I use this medicine? This drug is given as an infusion into a vein. It is administered  in a hospital or clinic by a specially trained health care professional. Talk to your pediatrician regarding the use of this medicine in children. Special care may be needed. Overdosage: If you think you have taken too much of this medicine contact a poison control center or emergency room at once. NOTE: This medicine is only for you. Do not share this medicine with others. What if I miss a dose? It is important not to miss your dose. Call your doctor or health care professional if you are unable to keep an appointment. What may interact with this medicine? Do not take this medicine with any of the following medications: -atazanavir -certain medicines for fungal infections like itraconazole and ketoconazole -St. John's Wort This medicine may also interact with the following medications: -dexamethasone -diuretics -laxatives -medicines for seizures like carbamazepine, mephobarbital, phenobarbital, phenytoin, primidone -medicines to increase blood counts like filgrastim, pegfilgrastim, sargramostim -prochlorperazine -vaccines This list may not describe all possible interactions. Give your health care provider a list of all the medicines, herbs, non-prescription drugs, or dietary supplements you use. Also tell them if you smoke, drink alcohol, or use illegal drugs. Some items may interact with your medicine. What should I watch for while using this medicine? Your condition will be monitored carefully while you are receiving this medicine. You will need important blood work done while you are taking this medicine. This drug may make you feel generally unwell. This is not uncommon, as chemotherapy can affect healthy cells as well as cancer cells. Report any side effects. Continue your course of treatment even though you feel ill unless your doctor tells you to stop. In some cases, you may be given additional medicines to help with side effects. Follow all directions for their use. You may get  drowsy or dizzy. Do not drive, use machinery, or do anything that needs mental alertness until you know how this medicine affects you. Do not stand or sit up quickly, especially if you are an older patient. This reduces the risk of dizzy or fainting spells. Call your doctor or health care professional for advice if you get a fever, chills or sore throat, or other symptoms of a cold or flu. Do not treat yourself. This drug decreases your body's ability to fight infections. Try to avoid being around people who are sick. This medicine may increase your risk to bruise or bleed. Call your doctor or health care professional if you notice any unusual bleeding. Be careful brushing and flossing your teeth or using a toothpick because you may get an infection or bleed more easily. If you have any dental work done, tell your dentist you are receiving this medicine. Avoid taking products that contain aspirin, acetaminophen, ibuprofen, naproxen, or ketoprofen unless instructed by your doctor. These medicines may hide a fever. Do not become pregnant while taking this medicine. Women should inform their doctor if they wish to become pregnant or think they might be pregnant. There is a potential for serious side effects to an unborn child. Talk to your health care professional or pharmacist for more information. Do not breast-feed an infant while taking this medicine. What side effects may I notice from receiving this medicine? Side effects that you should report to your doctor or health care professional as soon as possible: -allergic reactions like skin rash, itching or hives,  swelling of the face, lips, or tongue -low blood counts - this medicine may decrease the number of white blood cells, red blood cells and platelets. You may be at increased risk for infections and bleeding. -signs of infection - fever or chills, cough, sore throat, pain or difficulty passing urine -signs of decreased platelets or bleeding -  bruising, pinpoint red spots on the skin, black, tarry stools, blood in the urine -signs of decreased red blood cells - unusually weak or tired, fainting spells, lightheadedness -breathing problems -chest pain -diarrhea -feeling faint or lightheaded, falls -flushing, runny nose, sweating during infusion -mouth sores or pain -pain, swelling, redness or irritation where injected -pain, swelling, warmth in the leg -pain, tingling, numbness in the hands or feet -problems with balance, talking, walking -stomach cramps, pain -trouble passing urine or change in the amount of urine -vomiting as to be unable to hold down drinks or food -yellowing of the eyes or skin Side effects that usually do not require medical attention (report to your doctor or health care professional if they continue or are bothersome): -constipation -hair loss -headache -loss of appetite -nausea, vomiting -stomach upset This list may not describe all possible side effects. Call your doctor for medical advice about side effects. You may report side effects to FDA at 1-800-FDA-1088. Where should I keep my medicine? This drug is given in a hospital or clinic and will not be stored at home. NOTE: This sheet is a summary. It may not cover all possible information. If you have questions about this medicine, talk to your doctor, pharmacist, or health care provider.  2015, Elsevier/Gold Standard. (2013-06-13 16:29:32) Bevacizumab injection What is this medicine? BEVACIZUMAB (be va SIZ yoo mab) is a chemotherapy drug. It targets a protein found in many cancer cell types, and halts cancer growth. This drug treats many cancers including non-small cell lung cancer, ovarian cancer, cervical cancer, and colon or rectal cancer. It is usually given with other chemotherapy drugs. This medicine may be used for other purposes; ask your health care provider or pharmacist if you have questions. COMMON BRAND NAME(S): Avastin What should I  tell my health care provider before I take this medicine? They need to know if you have any of these conditions: -blood clots -heart disease, including heart failure, heart attack, or chest pain (angina) -high blood pressure -infection (especially a virus infection such as chickenpox, cold sores, or herpes) -kidney disease -lung disease -prior chemotherapy with doxorubicin, daunorubicin, epirubicin, or other anthracycline type chemotherapy agents -recent or ongoing radiation therapy -recent surgery -stroke -an unusual or allergic reaction to bevacizumab, hamster proteins, mouse proteins, other medicines, foods, dyes, or preservatives -pregnant or trying to get pregnant -breast-feeding How should I use this medicine? This medicine is for infusion into a vein. It is given by a health care professional in a hospital or clinic setting. Talk to your pediatrician regarding the use of this medicine in children. Special care may be needed. Overdosage: If you think you have taken too much of this medicine contact a poison control center or emergency room at once. NOTE: This medicine is only for you. Do not share this medicine with others. What if I miss a dose? It is important not to miss your dose. Call your doctor or health care professional if you are unable to keep an appointment. What may interact with this medicine? Interactions are not expected. This list may not describe all possible interactions. Give your health care provider a list of all the  medicines, herbs, non-prescription drugs, or dietary supplements you use. Also tell them if you smoke, drink alcohol, or use illegal drugs. Some items may interact with your medicine. What should I watch for while using this medicine? Your condition will be monitored carefully while you are receiving this medicine. You will need important blood work and urine testing done while you are taking this medicine. During your treatment, let your health  care professional know if you have any unusual symptoms, such as difficulty breathing. This medicine may rarely cause 'gastrointestinal perforation' (holes in the stomach, intestines or colon), a serious side effect requiring surgery to repair. This medicine should be started at least 28 days following major surgery and the site of the surgery should be totally healed. Check with your doctor before scheduling dental work or surgery while you are receiving this treatment. Talk to your doctor if you have recently had surgery or if you have a wound that has not healed. Do not become pregnant while taking this medicine. Women should inform their doctor if they wish to become pregnant or think they might be pregnant. There is a potential for serious side effects to an unborn child. Talk to your health care professional or pharmacist for more information. Do not breast-feed an infant while taking this medicine. This medicine has caused ovarian failure in some women. This medicine may interfere with the ability to have a child. You should talk to your doctor or health care professional if you are concerned about your fertility. What side effects may I notice from receiving this medicine? Side effects that you should report to your doctor or health care professional as soon as possible: -allergic reactions like skin rash, itching or hives, swelling of the face, lips, or tongue -signs of infection - fever or chills, cough, sore throat, pain or trouble passing urine -signs of decreased platelets or bleeding - bruising, pinpoint red spots on the skin, black, tarry stools, nosebleeds, blood in the urine -breathing problems -changes in vision -chest pain -confusion -jaw pain, especially after dental work -mouth sores -seizures -severe abdominal pain -severe headache -sudden numbness or weakness of the face, arm or leg -swelling of legs or ankles -symptoms of a stroke: change in mental awareness, inability to  talk or move one side of the body (especially in patients with lung cancer) -trouble passing urine or change in the amount of urine -trouble speaking or understanding -trouble walking, dizziness, loss of balance or coordination Side effects that usually do not require medical attention (report to your doctor or health care professional if they continue or are bothersome): -constipation -diarrhea -dry skin -headache -loss of appetite -nausea, vomiting This list may not describe all possible side effects. Call your doctor for medical advice about side effects. You may report side effects to FDA at 1-800-FDA-1088. Where should I keep my medicine? This drug is given in a hospital or clinic and will not be stored at home. NOTE: This sheet is a summary. It may not cover all possible information. If you have questions about this medicine, talk to your doctor, pharmacist, or health care provider.  2015, Elsevier/Gold Standard. (2013-11-15 11:38:34)

## 2014-10-25 NOTE — Progress Notes (Signed)
[facility] Clinical Social Work  Holiday representative met with patient at [facility] to offer support and assess for needs. Pt was very appreciative of assistance provided through [employer] and food card through [facility] counselor. CSW is awaiting word from [facility] [employer] for possible additional support. Pt reports to be coping pretty well currently, but was distressed about ED visit over the weekend. He feels less anxious currently. CSW to follow up with pt next week and reassess needs. Pt aware to reach out to CSW as needed in the future.  Clinical Social Work interventions: Emotional support Resource obtainment    Loren Racer, [place] Tuesdays 8:30-1pm Wednesdays 8:30-12pm  [phone]

## 2014-10-25 NOTE — Progress Notes (Signed)
[facility]  OFFICE PROGRESS NOTE  [name], MD [address] [place] [address]  DIAGNOSIS: Malignant brain tumor  Seizure disorder  Other emphysema  Chief Complaint  Patient presents with  . Glioblastoma multiforme    CURRENT THERAPY: Irinotecan/Avastin every 2 weeks. Had been treated with Temodar for 5 days every 28 days for 2 cycles plus leukocyte vaccine for 2 doses at [facility] trial with repeat MRI on 08/23/2014 showing progression of disease. He was started on Avastin 10 mg per kilogram on 09/27/2014 with plans to continue Avastin every 2 weeks together with irinotecan 125 mg per meter squared every 2 weeks.    INTERVAL HISTORY: Cody Buchanan 58 y.o. male returns for continuation of therapy with irinotecan/Avastin cycle 2 for recurrent glioblastoma multiforme. He was seen in the emergency room 5 days ago with sinusitis and was treated with Augmentin with success. He denies any headache now, nausea, vomiting, diarrhea, melena, medication, hematuria, lower extremity swelling or redness, seizure activity, skin rash, or sore throat.  MEDICAL HISTORY: Past Medical History  Diagnosis Date  . Glioblastoma   . GERD (gastroesophageal reflux disease)   . Seizures   . Anxiety 09/07/2014    INTERIM HISTORY: has Malignant brain tumor, left frontal glioblastoma multiforme grade 4; Chronic obstructive pulmonary disease; Seizure disorder; Allergic rhinitis; Acute gouty arthritis; and Anxiety on his problem list.    ALLERGIES:  has No Known Allergies.  MEDICATIONS: has a current medication list which includes the following prescription(s): acetaminophen, albuterol, amoxicillin-clavulanate, bisacodyl, diazepam, ibuprofen, levetiracetam, lidocaine-prilocaine, loperamide, loratadine-pseudoephedrine, multivitamin with minerals, ondansetron, oxycodone, oxycodone-acetaminophen, and oxycodone-acetaminophen, and the  following Facility-Administered Medications: sodium chloride, heparin lock flush, irinotecan (CAMPTOSAR) 252 mg in dextrose 5 % 500 mL chemo infusion, ondansetron (ZOFRAN) 8 mg, dexamethasone (DECADRON) 10 mg in sodium chloride 0.9 % 50 mL IVPB, and sodium chloride.  SURGICAL HISTORY:  Past Surgical History  Procedure Laterality Date  . Salivary gland surgery    . Brain tumor excision      FAMILY HISTORY: family history includes Cancer in his mother; Diabetes in his father and mother; Hypertension in his mother; Stroke in his mother.  SOCIAL HISTORY:  reports that he has been smoking Cigarettes.  He has been smoking about 0.00 packs per day. He has never used smokeless tobacco. He reports that he does not drink alcohol or use illicit drugs.  REVIEW OF SYSTEMS:  Other than that discussed above is noncontributory.  PHYSICAL EXAMINATION: ECOG PERFORMANCE STATUS: 1 - Symptomatic but completely ambulatory  There were no vitals taken for this visit.  GENERAL:alert, no distress and comfortable. Moderately obese. SKIN: skin color, texture, turgor are normal, no rashes or significant lesions EYES: PERLA; Conjunctiva are pink and non-injected, sclera clear SINUSES: No redness or tenderness over maxillary or ethmoid sinuses. No evidence of poor lungs. OROPHARYNX:no exudate, no erythema on lips, buccal mucosa, or tongue. NECK: supple, thyroid normal size, non-tender, without nodularity. No masses CHEST: Increased AP diameter with no breast masses. LifePort in place. LYMPH:  no palpable lymphadenopathy in the cervical, axillary or inguinal LUNGS: clear to auscultation and percussion with normal breathing effort HEART: regular rate & rhythm and no murmurs. ABDOMEN:abdomen soft, non-tender and normal bowel sounds MUSCULOSKELETAL:no cyanosis of digits and no clubbing. Range of motion normal.  NEURO: alert & oriented x 3 with fluent speech, no focal motor/sensory deficits   LABORATORY  DATA: Infusion on 10/25/2014  Component Date Value  Ref Range Status  . WBC 10/25/2014 5.5  4.0 - 10.5 K/uL Final  . RBC 10/25/2014 4.77  4.22 - 5.81 MIL/uL Final  . Hemoglobin 10/25/2014 13.8  13.0 - 17.0 g/dL Final  . HCT 10/25/2014 40.6  39.0 - 52.0 % Final  . MCV 10/25/2014 85.1  78.0 - 100.0 fL Final  . MCH 10/25/2014 28.9  26.0 - 34.0 pg Final  . MCHC 10/25/2014 34.0  30.0 - 36.0 g/dL Final  . RDW 10/25/2014 14.6  11.5 - 15.5 % Final  . Platelets 10/25/2014 235  150 - 400 K/uL Final  . Neutrophils Relative % 10/25/2014 71  43 - 77 % Final  . Neutro Abs 10/25/2014 3.9  1.7 - 7.7 K/uL Final  . Lymphocytes Relative 10/25/2014 19  12 - 46 % Final  . Lymphs Abs 10/25/2014 1.0  0.7 - 4.0 K/uL Final  . Monocytes Relative 10/25/2014 6  3 - 12 % Final  . Monocytes Absolute 10/25/2014 0.3  0.1 - 1.0 K/uL Final  . Eosinophils Relative 10/25/2014 3  0 - 5 % Final  . Eosinophils Absolute 10/25/2014 0.2  0.0 - 0.7 K/uL Final  . Basophils Relative 10/25/2014 1  0 - 1 % Final  . Basophils Absolute 10/25/2014 0.0  0.0 - 0.1 K/uL Final  . Sodium 10/25/2014 142  137 - 147 mEq/L Final  . Potassium 10/25/2014 3.9  3.7 - 5.3 mEq/L Final  . Chloride 10/25/2014 105  96 - 112 mEq/L Final  . CO2 10/25/2014 24  19 - 32 mEq/L Final  . Glucose, Bld 10/25/2014 118* 70 - 99 mg/dL Final  . BUN 10/25/2014 13  6 - 23 mg/dL Final  . Creatinine, Ser 10/25/2014 1.08  0.50 - 1.35 mg/dL Final  . Calcium 10/25/2014 9.3  8.4 - 10.5 mg/dL Final  . Total Protein 10/25/2014 7.2  6.0 - 8.3 g/dL Final  . Albumin 10/25/2014 3.3* 3.5 - 5.2 g/dL Final  . AST 10/25/2014 17  0 - 37 U/L Final  . ALT 10/25/2014 22  0 - 53 U/L Final  . Alkaline Phosphatase 10/25/2014 74  39 - 117 U/L Final  . Total Bilirubin 10/25/2014 0.4  0.3 - 1.2 mg/dL Final  . GFR calc non Af Amer 10/25/2014 74* >90 mL/min Final  . GFR calc Af Amer 10/25/2014 86* >90 mL/min Final   Comment: (NOTE)                          The eGFR has been calculated  using the CKD EPI equation.                          This calculation has not been validated in all clinical situations.                          eGFR's persistently <90 mL/min signify possible Chronic Kidney                          Disease.  . Anion gap 10/25/2014 13  5 - 15 Final  Infusion on 10/11/2014  Component Date Value Ref Range Status  . Specific Gravity, Urine 10/11/2014 1.010  1.005 - 1.030 Final  . pH 10/11/2014 6.0  5.0 - 8.0 Final  . Glucose, UA 10/11/2014 NEGATIVE  NEGATIVE mg/dL Final  . Hgb urine dipstick 10/11/2014 NEGATIVE  NEGATIVE  Final  . Bilirubin Urine 10/11/2014 NEGATIVE  NEGATIVE Final  . Ketones, ur 10/11/2014 NEGATIVE  NEGATIVE mg/dL Final  . Protein, ur 10/11/2014 NEGATIVE  NEGATIVE mg/dL Final  . Urobilinogen, UA 10/11/2014 0.2  0.0 - 1.0 mg/dL Final  . Nitrite 10/11/2014 NEGATIVE  NEGATIVE Final  . Leukocytes, UA 10/11/2014 NEGATIVE  NEGATIVE Final  Hospital Outpatient Visit on 10/10/2014  Component Date Value Ref Range Status  . WBC 10/10/2014 11.2* 4.0 - 10.5 K/uL Final  . RBC 10/10/2014 5.31  4.22 - 5.81 MIL/uL Final  . Hemoglobin 10/10/2014 15.8  13.0 - 17.0 g/dL Final  . HCT 10/10/2014 45.7  39.0 - 52.0 % Final  . MCV 10/10/2014 86.1  78.0 - 100.0 fL Final  . MCH 10/10/2014 29.8  26.0 - 34.0 pg Final  . MCHC 10/10/2014 34.6  30.0 - 36.0 g/dL Final  . RDW 10/10/2014 14.3  11.5 - 15.5 % Final  . Platelets 10/10/2014 183  150 - 400 K/uL Final  . Neutrophils Relative % 10/10/2014 82* 43 - 77 % Final  . Neutro Abs 10/10/2014 9.2* 1.7 - 7.7 K/uL Final  . Lymphocytes Relative 10/10/2014 13  12 - 46 % Final  . Lymphs Abs 10/10/2014 1.4  0.7 - 4.0 K/uL Final  . Monocytes Relative 10/10/2014 4  3 - 12 % Final  . Monocytes Absolute 10/10/2014 0.5  0.1 - 1.0 K/uL Final  . Eosinophils Relative 10/10/2014 1  0 - 5 % Final  . Eosinophils Absolute 10/10/2014 0.1  0.0 - 0.7 K/uL Final  . Basophils Relative 10/10/2014 0  0 - 1 % Final  . Basophils Absolute  10/10/2014 0.0  0.0 - 0.1 K/uL Final  . Prothrombin Time 10/10/2014 13.2  11.6 - 15.2 seconds Final  . INR 10/10/2014 0.99  0.00 - 1.49 Final  Office Visit on 10/09/2014  Component Date Value Ref Range Status  . WBC 10/09/2014 10.7* 4.0 - 10.5 K/uL Final  . RBC 10/09/2014 4.79  4.22 - 5.81 MIL/uL Final  . Hemoglobin 10/09/2014 14.0  13.0 - 17.0 g/dL Final  . HCT 10/09/2014 40.5  39.0 - 52.0 % Final  . MCV 10/09/2014 84.6  78.0 - 100.0 fL Final  . MCH 10/09/2014 29.2  26.0 - 34.0 pg Final  . MCHC 10/09/2014 34.6  30.0 - 36.0 g/dL Final  . RDW 10/09/2014 14.2  11.5 - 15.5 % Final  . Platelets 10/09/2014 196  150 - 400 K/uL Final   PLATELET CLUMPS NOTED ON SMEAR, COUNT APPEARS ADEQUATE  . Neutrophils Relative % 10/09/2014 76  43 - 77 % Final  . Neutro Abs 10/09/2014 8.2* 1.7 - 7.7 K/uL Final  . Lymphocytes Relative 10/09/2014 19  12 - 46 % Final  . Lymphs Abs 10/09/2014 2.0  0.7 - 4.0 K/uL Final  . Monocytes Relative 10/09/2014 4  3 - 12 % Final  . Monocytes Absolute 10/09/2014 0.4  0.1 - 1.0 K/uL Final  . Eosinophils Relative 10/09/2014 1  0 - 5 % Final  . Eosinophils Absolute 10/09/2014 0.1  0.0 - 0.7 K/uL Final  . Basophils Relative 10/09/2014 0  0 - 1 % Final  . Basophils Absolute 10/09/2014 0.0  0.0 - 0.1 K/uL Final  . WBC Morphology 10/09/2014 SMUDGE CELLS   Final   ATYPICAL LYMPHOCYTES  . Sodium 10/09/2014 139  137 - 147 mEq/L Final  . Potassium 10/09/2014 3.7  3.7 - 5.3 mEq/L Final  . Chloride 10/09/2014 104  96 - 112 mEq/L Final  .  CO2 10/09/2014 21  19 - 32 mEq/L Final  . Glucose, Bld 10/09/2014 95  70 - 99 mg/dL Final  . BUN 10/09/2014 22  6 - 23 mg/dL Final  . Creatinine, Ser 10/09/2014 1.24  0.50 - 1.35 mg/dL Final  . Calcium 10/09/2014 8.7  8.4 - 10.5 mg/dL Final  . Total Protein 10/09/2014 6.8  6.0 - 8.3 g/dL Final  . Albumin 10/09/2014 3.3* 3.5 - 5.2 g/dL Final  . AST 10/09/2014 15  0 - 37 U/L Final  . ALT 10/09/2014 18  0 - 53 U/L Final  . Alkaline Phosphatase  10/09/2014 72  39 - 117 U/L Final  . Total Bilirubin 10/09/2014 0.2* 0.3 - 1.2 mg/dL Final  . GFR calc non Af Amer 10/09/2014 63* >90 mL/min Final  . GFR calc Af Amer 10/09/2014 73* >90 mL/min Final   Comment: (NOTE)                          The eGFR has been calculated using the CKD EPI equation.                          This calculation has not been validated in all clinical situations.                          eGFR's persistently <90 mL/min signify possible Chronic Kidney                          Disease.  . Anion gap 10/09/2014 14  5 - 15 Final  . LDH 10/09/2014 140  94 - 250 U/L Final   SLIGHT HEMOLYSIS    PATHOLOGY: No new pathology.  Urinalysis    Component Value Date/Time   LABSPEC 1.010 10/11/2014 1048   PHURINE 6.0 10/11/2014 1048   GLUCOSEU NEGATIVE 10/11/2014 1048   HGBUR NEGATIVE 10/11/2014 1048   BILIRUBINUR NEGATIVE 10/11/2014 1048   KETONESUR NEGATIVE 10/11/2014 1048   PROTEINUR NEGATIVE 10/11/2014 1048   UROBILINOGEN 0.2 10/11/2014 1048   NITRITE NEGATIVE 10/11/2014 1048   LEUKOCYTESUR NEGATIVE 10/11/2014 1048    RADIOGRAPHIC STUDIES: Ir Fluoro Guide Cv Line Right  10/10/2014   CLINICAL DATA:  Recurrent glioblastoma and need for porta cath for chemotherapy.  EXAM: IMPLANTED PORT A CATH PLACEMENT WITH ULTRASOUND AND FLUOROSCOPIC GUIDANCE  ANESTHESIA/SEDATION: 4.0 Mg IV Versed; 100 mcg IV Fentanyl  Total Moderate Sedation Time:  35 minutes  Additional Medications: 2 g IV Ancef. As antibiotic prophylaxis, Ancef was ordered pre-procedure and administered intravenously within one hour of incision.  FLUOROSCOPY TIME:  18 seconds.  PROCEDURE: The procedure, risks, benefits, and alternatives were explained to the patient. Questions regarding the procedure were encouraged and answered. The patient understands and consents to the procedure.  The right neck and chest were prepped with chlorhexidine in a sterile fashion, and a sterile drape was applied covering the operative  field. Maximum barrier sterile technique with sterile gowns and gloves were used for the procedure. A time-out was performed. Local anesthesia was provided with 1% lidocaine.  Ultrasound was used to confirm patency of the right internal jugular vein. After creating a small venotomy incision, a 21 gauge needle was advanced into the right internal jugular vein under direct, real-time ultrasound guidance. Ultrasound image documentation was performed. After securing guidewire access, an 8 Fr dilator was placed. A J-wire was kinked to  measure appropriate catheter length.  A subcutaneous port pocket was then created along the upper chest wall utilizing sharp and blunt dissection. Portable cautery was utilized. The pocket was irrigated with sterile saline.  A single lumen power injectable port was chosen for placement. The 8 Fr catheter was tunneled from the port pocket site to the venotomy incision. The port was placed in the pocket. External catheter was trimmed to appropriate length based on guidewire measurement.  At the venotomy, an 8 Fr peel-away sheath was placed over a guidewire. The catheter was then placed through the sheath and the sheath removed. Final catheter positioning was confirmed and documented with a fluoroscopic spot image. The port was accessed with a needle and aspirated and flushed with heparinized saline. The needle was removed.  The venotomy and port pocket incisions were closed with subcutaneous 3-0 Monocryl and subcuticular 4-0 Vicryl. Dermabond was applied to both incisions.  COMPLICATIONS: None  FINDINGS: After catheter placement, the tip lies at the cavoatrial junction. The catheter aspirates normally and is ready for immediate use.  IMPRESSION: Placement of single lumen port a cath via right internal jugular vein. The catheter tip lies at the cavoatrial junction. A power injectable port a cath was placed and is ready for immediate use.   Electronically Signed   By: Irish Lack M.D.   On:  10/10/2014 14:22   Ir US Guide Vasc Access Right  10/10/2014   CLINICAL DATA:  Recurrent glioblastoma and need for porta cath for chemotherapy.  EXAM: IMPLANTED PORT A CATH PLACEMENT WITH ULTRASOUND AND FLUOROSCOPIC GUIDANCE  ANESTHESIA/SEDATION: 4.0 Mg IV Versed; 100 mcg IV Fentanyl  Total Moderate Sedation Time:  35 minutes  Additional Medications: 2 g IV Ancef. As antibiotic prophylaxis, Ancef was ordered pre-procedure and administered intravenously within one hour of incision.  FLUOROSCOPY TIME:  18 seconds.  PROCEDURE: The procedure, risks, benefits, and alternatives were explained to the patient. Questions regarding the procedure were encouraged and answered. The patient understands and consents to the procedure.  The right neck and chest were prepped with chlorhexidine in a sterile fashion, and a sterile drape was applied covering the operative field. Maximum barrier sterile technique with sterile gowns and gloves were used for the procedure. A time-out was performed. Local anesthesia was provided with 1% lidocaine.  Ultrasound was used to confirm patency of the right internal jugular vein. After creating a small venotomy incision, a 21 gauge needle was advanced into the right internal jugular vein under direct, real-time ultrasound guidance. Ultrasound image documentation was performed. After securing guidewire access, an 8 Fr dilator was placed. A J-wire was kinked to measure appropriate catheter length.  A subcutaneous port pocket was then created along the upper chest wall utilizing sharp and blunt dissection. Portable cautery was utilized. The pocket was irrigated with sterile saline.  A single lumen power injectable port was chosen for placement. The 8 Fr catheter was tunneled from the port pocket site to the venotomy incision. The port was placed in the pocket. External catheter was trimmed to appropriate length based on guidewire measurement.  At the venotomy, an 8 Fr peel-away sheath was placed  over a guidewire. The catheter was then placed through the sheath and the sheath removed. Final catheter positioning was confirmed and documented with a fluoroscopic spot image. The port was accessed with a needle and aspirated and flushed with heparinized saline. The needle was removed.  The venotomy and port pocket incisions were closed with subcutaneous 3-0 Monocryl and subcuticular 4-0  Vicryl. Dermabond was applied to both incisions.  COMPLICATIONS: None  FINDINGS: After catheter placement, the tip lies at the cavoatrial junction. The catheter aspirates normally and is ready for immediate use.  IMPRESSION: Placement of single lumen port a cath via right internal jugular vein. The catheter tip lies at the cavoatrial junction. A power injectable port a cath was placed and is ready for immediate use.   Electronically Signed   By: Aletta Edouard M.D.   On: 10/10/2014 14:22    ASSESSMENT:  #1. Recurrent glioblastoma multiforme left frontal lobe, status post Avastin on 09/27/2014 with plans to deliver Avastin plus Irinotecan every 2 weeks, status post Avastin/irinotecan on 10/11/2014 with good tolerance  #2. Chronic obstructive pulmonary disease, still smoking.  #3. Seizure disorder, on treatment.  #4. Gout, controlled.  #5. Benign prostatic hypertrophy, on treatment with Flomax.  #6. Anxiety neurosis, controlled with diazepam.  #7. Symptomatic allergic rhinitis, recent treatment for sinusitis.     PLAN:  #1. Irinotecan/Avastin cycle #2 today. #2. Continue oxycodone and Valium. #3. Follow-up in 2 weeks to receive cycle #3 of treatment. Follow up at [facility] is at the end of November 2015.   All questions were answered. The patient knows to call the clinic with any problems, questions or concerns. We can certainly see the patient much sooner if necessary.   I spent 25 minutes counseling the patient face to face. The total time spent in the appointment was 30 minutes.    Doroteo Bradford, MD 10/25/2014 10:24 AM  DISCLAIMER:  This note was dictated with voice recognition software.  Similar sounding words can inadvertently be transcribed inaccurately and may not be corrected upon review.

## 2014-10-25 NOTE — Progress Notes (Signed)
Pt has been and remains unable to provide UA specimen. Okay per Dr. Barnet Glasgow to administer Avastin today based on UA dipstick results collected on 10/11/14.    1335:  Tolerated treatment w/o incident.  A&Ox4; in no apparent distress.

## 2014-10-29 HISTORY — PX: PORTACATH PLACEMENT: SHX2246

## 2014-10-31 ENCOUNTER — Encounter: Payer: Self-pay | Admitting: *Deleted

## 2014-10-31 NOTE — Progress Notes (Signed)
[facility] Clinical Social Work  Clinical Social Work contacted pt to inform him of assistance through [employer] had come in and was ready to be picked up. CSW had to leave a message on his home number, as his cell phone does not currently have vm set up at this time.  Clinical Social Work interventions: Resource assistance  Loren Racer, [place] Tuesdays 8:30-1pm Wednesdays 8:30-12pm  [phone]

## 2014-11-01 ENCOUNTER — Encounter: Payer: Self-pay | Admitting: *Deleted

## 2014-11-01 NOTE — Progress Notes (Signed)
[facility] Clinical Social Work  Clinical Social Work met briefly with pt to provide him with assistance donated on his behalf from [employer]. Pt very, very appreciative and denied other concerns currently. Pt aware and agrees to reach out to CSW as needed.    Clinical Social Work interventions: [employer] assistance     Loren Racer, [place] Tuesdays 8:30-1pm Wednesdays 8:30-12pm  [phone]

## 2014-11-06 ENCOUNTER — Inpatient Hospital Stay (HOSPITAL_COMMUNITY): Payer: 59

## 2014-11-08 ENCOUNTER — Encounter (HOSPITAL_COMMUNITY): Payer: Self-pay

## 2014-11-08 ENCOUNTER — Encounter (HOSPITAL_COMMUNITY): Payer: Medicaid Other | Attending: Hematology and Oncology

## 2014-11-08 ENCOUNTER — Encounter (HOSPITAL_BASED_OUTPATIENT_CLINIC_OR_DEPARTMENT_OTHER): Payer: Medicaid Other

## 2014-11-08 VITALS — BP 137/85 | HR 82 | Temp 98.2°F | Resp 18 | Wt 182.4 lb

## 2014-11-08 DIAGNOSIS — Z5112 Encounter for antineoplastic immunotherapy: Secondary | ICD-10-CM

## 2014-11-08 DIAGNOSIS — J301 Allergic rhinitis due to pollen: Secondary | ICD-10-CM

## 2014-11-08 DIAGNOSIS — C719 Malignant neoplasm of brain, unspecified: Secondary | ICD-10-CM

## 2014-11-08 DIAGNOSIS — C711 Malignant neoplasm of frontal lobe: Secondary | ICD-10-CM

## 2014-11-08 DIAGNOSIS — G40909 Epilepsy, unspecified, not intractable, without status epilepticus: Secondary | ICD-10-CM

## 2014-11-08 DIAGNOSIS — Z5111 Encounter for antineoplastic chemotherapy: Secondary | ICD-10-CM

## 2014-11-08 DIAGNOSIS — J309 Allergic rhinitis, unspecified: Secondary | ICD-10-CM

## 2014-11-08 DIAGNOSIS — J438 Other emphysema: Secondary | ICD-10-CM

## 2014-11-08 LAB — URINALYSIS, DIPSTICK ONLY
BILIRUBIN URINE: NEGATIVE
Glucose, UA: NEGATIVE mg/dL
Hgb urine dipstick: NEGATIVE
KETONES UR: NEGATIVE mg/dL
LEUKOCYTES UA: NEGATIVE
NITRITE: NEGATIVE
Protein, ur: NEGATIVE mg/dL
Specific Gravity, Urine: 1.025 (ref 1.005–1.030)
UROBILINOGEN UA: 0.2 mg/dL (ref 0.0–1.0)
pH: 5.5 (ref 5.0–8.0)

## 2014-11-08 LAB — CBC WITH DIFFERENTIAL/PLATELET
BASOS ABS: 0 10*3/uL (ref 0.0–0.1)
Basophils Relative: 0 % (ref 0–1)
Eosinophils Absolute: 0.1 10*3/uL (ref 0.0–0.7)
Eosinophils Relative: 1 % (ref 0–5)
HCT: 41.2 % (ref 39.0–52.0)
HEMOGLOBIN: 14.2 g/dL (ref 13.0–17.0)
LYMPHS PCT: 14 % (ref 12–46)
Lymphs Abs: 1.3 10*3/uL (ref 0.7–4.0)
MCH: 29.5 pg (ref 26.0–34.0)
MCHC: 34.5 g/dL (ref 30.0–36.0)
MCV: 85.7 fL (ref 78.0–100.0)
MONOS PCT: 7 % (ref 3–12)
Monocytes Absolute: 0.6 10*3/uL (ref 0.1–1.0)
NEUTROS ABS: 7 10*3/uL (ref 1.7–7.7)
Neutrophils Relative %: 78 % — ABNORMAL HIGH (ref 43–77)
Platelets: 261 10*3/uL (ref 150–400)
RBC: 4.81 MIL/uL (ref 4.22–5.81)
RDW: 15.4 % (ref 11.5–15.5)
WBC: 8.9 10*3/uL (ref 4.0–10.5)

## 2014-11-08 LAB — COMPREHENSIVE METABOLIC PANEL
ALT: 16 U/L (ref 0–53)
AST: 17 U/L (ref 0–37)
Albumin: 3.6 g/dL (ref 3.5–5.2)
Alkaline Phosphatase: 76 U/L (ref 39–117)
Anion gap: 12 (ref 5–15)
BILIRUBIN TOTAL: 0.4 mg/dL (ref 0.3–1.2)
BUN: 13 mg/dL (ref 6–23)
CHLORIDE: 106 meq/L (ref 96–112)
CO2: 24 mEq/L (ref 19–32)
Calcium: 9.3 mg/dL (ref 8.4–10.5)
Creatinine, Ser: 1.16 mg/dL (ref 0.50–1.35)
GFR calc non Af Amer: 68 mL/min — ABNORMAL LOW (ref >90)
GFR, EST AFRICAN AMERICAN: 79 mL/min — AB (ref >90)
GLUCOSE: 81 mg/dL (ref 70–99)
Potassium: 3.9 mEq/L (ref 3.7–5.3)
Sodium: 142 mEq/L (ref 137–147)
Total Protein: 7.4 g/dL (ref 6.0–8.3)

## 2014-11-08 MED ORDER — IRINOTECAN HCL CHEMO INJECTION 100 MG/5ML
125.0000 mg/m2 | Freq: Once | INTRAVENOUS | Status: AC
  Administered 2014-11-08: 252 mg via INTRAVENOUS
  Filled 2014-11-08: qty 4.2

## 2014-11-08 MED ORDER — OXYCODONE HCL 5 MG PO CAPS: 5.0000 mg | ORAL_CAPSULE | Freq: Four times a day (QID) | ORAL | Status: DC | PRN

## 2014-11-08 MED ORDER — BEVACIZUMAB CHEMO INJECTION 400 MG/16ML
10.0000 mg/kg | Freq: Once | INTRAVENOUS | Status: AC
  Administered 2014-11-08: 850 mg via INTRAVENOUS
  Filled 2014-11-08: qty 34

## 2014-11-08 MED ORDER — SODIUM CHLORIDE 0.9 % IV SOLN
Freq: Once | INTRAVENOUS | Status: AC
  Administered 2014-11-08: 10:00:00 via INTRAVENOUS

## 2014-11-08 MED ORDER — ONDANSETRON HCL 40 MG/20ML IJ SOLN
Freq: Once | INTRAMUSCULAR | Status: AC
  Administered 2014-11-08: 8 mg via INTRAVENOUS
  Filled 2014-11-08: qty 4

## 2014-11-08 MED ORDER — SODIUM CHLORIDE 0.9 % IJ SOLN
10.0000 mL | INTRAMUSCULAR | Status: DC | PRN
  Administered 2014-11-08: 10 mL
  Filled 2014-11-08: qty 10

## 2014-11-08 MED ORDER — HEPARIN SOD (PORK) LOCK FLUSH 100 UNIT/ML IV SOLN
500.0000 [IU] | Freq: Once | INTRAVENOUS | Status: AC | PRN
  Administered 2014-11-08: 500 [IU]

## 2014-11-08 NOTE — Patient Instructions (Signed)
[facility] Discharge Instructions  RECOMMENDATIONS MADE BY THE CONSULTANT AND ANY TEST RESULTS WILL BE SENT TO YOUR REFERRING PHYSICIAN.  EXAM FINDINGS BY THE PHYSICIAN TODAY AND SIGNS OR SYMPTOMS TO REPORT TO CLINIC OR PRIMARY PHYSICIAN: Exam and findings as discussed by Dr. Barnet Glasgow.  While on chemotherapy - no flu vaccines, no pneumonia vaccines and no dental extractions.  Report fevers, chills, uncontrolled nausea, vomiting, diarrhea or other concerns.  MEDICATIONS PRESCRIBED:  Refill for oxycodone  INSTRUCTIONS/FOLLOW-UP: Follow-up in 2 weeks.  Thank you for choosing [facility] to provide your oncology and hematology care.  To afford each patient quality time with our providers, please arrive at least 15 minutes before your scheduled appointment time.  With your help, our goal is to use those 15 minutes to complete the necessary work-up to ensure our physicians have the information they need to help with your evaluation and healthcare recommendations.    Effective January 1st, 2014, we ask that you re-schedule your appointment with our physicians should you arrive 10 or more minutes late for your appointment.  We strive to give you quality time with our providers, and arriving late affects you and other patients whose appointments are after yours.    Again, thank you for choosing [facility].  Our hope is that these requests will decrease the amount of time that you wait before being seen by our physicians.       _____________________________________________________________  Should you have questions after your visit to [facility], please contact our office at [phone] between the hours of 8:30 a.m. and 4:30 p.m.  Voicemails left after 4:30 p.m. will not be returned until the following business day.  For prescription refill requests, have your pharmacy contact our office with your prescription refill request.     _______________________________________________________________  We hope that we have given you very good care.  You may receive a patient satisfaction survey in the mail, please complete it and return it as soon as possible.  We value your feedback!  _______________________________________________________________  Have you asked about our STAR program?  STAR stands for Survivorship Training and Rehabilitation, and this is a nationally recognized cancer care program that focuses on survivorship and rehabilitation.  Cancer and cancer treatments may cause problems, such as, pain, making you feel tired and keeping you from doing the things that you need or want to do. Cancer rehabilitation can help. Our goal is to reduce these troubling effects and help you have the best quality of life possible.  You may receive a survey from a nurse that asks questions about your current state of health.  Based on the survey results, all eligible patients will be referred to the [facility] program for an evaluation so we can better serve you!  A frequently asked questions sheet is available upon request.

## 2014-11-08 NOTE — Progress Notes (Signed)
Cody Buchanan  OFFICE PROGRESS NOTE  [name], MD [place] [address]  DIAGNOSIS: Malignant brain tumor - Plan: CBC with Differential, Comprehensive metabolic panel, CBC with Differential, Comprehensive metabolic panel  Seizure disorder  Other emphysema  Allergic rhinitis due to pollen  Chief Complaint  Patient presents with  . Malignant brain tumor    CURRENT THERAPY: Irinotecan/Avastin every 2 weeks. Had been treated with Temodar for 5 days every 28 days for 2 cycles plus leukocyte vaccine for 2 doses at [facility] trial with repeat MRI on 08/23/2014 showing progression of disease. He was started on Avastin 10 mg per kilogram on 09/27/2014 with plans to continue Avastin every 2 weeks together with irinotecan 125 mg per meter squared every 2 weeks. Since his last visit he had his teeth cleaned and was told that he may need some dental extractions. Currently he remains asymptomatic in regard to dental caries. Appetite is good with no nausea, vomiting, headache, focal weakness, seizure activity, lower extremity swelling or redness, sore throat, nausea, vomiting, diarrhea, skin rash, or shortness of breath.  INTERVAL HISTORY: Cody Buchanan 58 y.o. male returns for follow-up and continuation of therapy with irinotecan/Avastin cycle 3 for recurrent glioblastoma multiforme.   MEDICAL HISTORY: Past Medical History  Diagnosis Date  . Glioblastoma   . GERD (gastroesophageal reflux disease)   . Seizures   . Anxiety 09/07/2014    INTERIM HISTORY: has Malignant brain tumor, left frontal glioblastoma multiforme grade 4; Chronic obstructive pulmonary disease; Seizure disorder; Allergic rhinitis; Acute gouty arthritis; and Anxiety on his problem list.   Oncology History   03/03/2014 Partial resection, [name], [place], Dr. [name] 04/07/2014 Re-resection at [place], Dr. [name]     Malignant  brain tumor, left frontal glioblastoma multiforme grade 4   03/03/2014 Initial Diagnosis Malignant brain tumor, left frontal glioblastoma multiforme grade 4, [place], [place]. partial resection, Dr. [name]   04/07/2014 Surgery Re-resection at [place], Dr. Derrill Memo   05/10/2014 - 06/21/2014 Radiation Therapy By Dr. Sondra Come with 6000 cGy in 30 fractions to left frontal brain   07/07/2014 -  Chemotherapy Temodar x5 days every 4 weeks for 2 cycles +2 infusions of patient's own lymphocytes.   08/23/2014 Progression MRI showed progression at [facility]   09/27/2014 -  Chemotherapy Avastin started with irinotecan added on 10/11/2014      ALLERGIES:  has No Known Allergies.  MEDICATIONS: has a current medication list which includes the following prescription(s): acetaminophen, albuterol, bisacodyl, diazepam, ibuprofen, levetiracetam, lidocaine-prilocaine, loperamide, multivitamin with minerals, ondansetron, oxycodone, and loratadine-pseudoephedrine.  SURGICAL HISTORY:  Past Surgical History  Procedure Laterality Date  . Salivary gland surgery    . Brain tumor excision      FAMILY HISTORY: family history includes Cancer in his mother; Diabetes in his father and mother; Hypertension in his mother; Stroke in his mother.  SOCIAL HISTORY:  reports that he has been smoking Cigarettes.  He has been smoking about 0.00 packs per day. He has never used smokeless tobacco. He reports that he does not drink alcohol or use illicit drugs.  REVIEW OF SYSTEMS:  Other than that discussed above is noncontributory.  PHYSICAL EXAMINATION: ECOG PERFORMANCE STATUS: 1 - Symptomatic but completely ambulatory  Blood pressure 137/85, pulse 82, temperature 98.2 F (36.8 C), temperature source Oral, resp. rate 18, weight 182 lb 6.4 oz (82.736 kg), SpO2 97 %.  GENERAL:alert,  no distress and comfortable SKIN: skin color, texture, turgor are normal, no rashes or significant lesions EYES:  PERLA; Conjunctiva are pink and non-injected, sclera clear SINUSES: No redness or tenderness over maxillary or ethmoid sinuses OROPHARYNX:no exudate, no erythema on lips, buccal mucosa, or tongue. NECK: supple, thyroid normal size, non-tender, without nodularity. No masses CHEST: increased AP diameter with no breast masses. LifePort in place. LYMPH:  no palpable lymphadenopathy in the cervical, axillary or inguinal LUNGS: clear to auscultation and percussion with normal breathing effort HEART: regular rate & rhythm and no murmurs. ABDOMEN:abdomen soft, non-tender and normal bowel sounds MUSCULOSKELETAL:no cyanosis of digits and no clubbing. Range of motion normal.  NEURO: alert & oriented x 3 with fluent speech, no focal motor/sensory deficits   LABORATORY DATA: Infusion on 11/08/2014  Component Date Value Ref Range Status  . WBC 11/08/2014 8.9  4.0 - 10.5 K/uL Final  . RBC 11/08/2014 4.81  4.22 - 5.81 MIL/uL Final  . Hemoglobin 11/08/2014 14.2  13.0 - 17.0 g/dL Final  . HCT 11/08/2014 41.2  39.0 - 52.0 % Final  . MCV 11/08/2014 85.7  78.0 - 100.0 fL Final  . MCH 11/08/2014 29.5  26.0 - 34.0 pg Final  . MCHC 11/08/2014 34.5  30.0 - 36.0 g/dL Final  . RDW 11/08/2014 15.4  11.5 - 15.5 % Final  . Platelets 11/08/2014 261  150 - 400 K/uL Final  . Neutrophils Relative % 11/08/2014 78* 43 - 77 % Final  . Neutro Abs 11/08/2014 7.0  1.7 - 7.7 K/uL Final  . Lymphocytes Relative 11/08/2014 14  12 - 46 % Final  . Lymphs Abs 11/08/2014 1.3  0.7 - 4.0 K/uL Final  . Monocytes Relative 11/08/2014 7  3 - 12 % Final  . Monocytes Absolute 11/08/2014 0.6  0.1 - 1.0 K/uL Final  . Eosinophils Relative 11/08/2014 1  0 - 5 % Final  . Eosinophils Absolute 11/08/2014 0.1  0.0 - 0.7 K/uL Final  . Basophils Relative 11/08/2014 0  0 - 1 % Final  . Basophils Absolute 11/08/2014 0.0  0.0 - 0.1 K/uL Final  . Specific Gravity, Urine 11/08/2014 1.025  1.005 - 1.030 Final  . pH 11/08/2014 5.5  5.0 - 8.0 Final  .  Glucose, UA 11/08/2014 NEGATIVE  NEGATIVE mg/dL Final  . Hgb urine dipstick 11/08/2014 NEGATIVE  NEGATIVE Final  . Bilirubin Urine 11/08/2014 NEGATIVE  NEGATIVE Final  . Ketones, ur 11/08/2014 NEGATIVE  NEGATIVE mg/dL Final  . Protein, ur 11/08/2014 NEGATIVE  NEGATIVE mg/dL Final  . Urobilinogen, UA 11/08/2014 0.2  0.0 - 1.0 mg/dL Final  . Nitrite 11/08/2014 NEGATIVE  NEGATIVE Final  . Leukocytes, UA 11/08/2014 NEGATIVE  NEGATIVE Final  Infusion on 10/25/2014  Component Date Value Ref Range Status  . WBC 10/25/2014 5.5  4.0 - 10.5 K/uL Final  . RBC 10/25/2014 4.77  4.22 - 5.81 MIL/uL Final  . Hemoglobin 10/25/2014 13.8  13.0 - 17.0 g/dL Final  . HCT 10/25/2014 40.6  39.0 - 52.0 % Final  . MCV 10/25/2014 85.1  78.0 - 100.0 fL Final  . MCH 10/25/2014 28.9  26.0 - 34.0 pg Final  . MCHC 10/25/2014 34.0  30.0 - 36.0 g/dL Final  . RDW 10/25/2014 14.6  11.5 - 15.5 % Final  . Platelets 10/25/2014 235  150 - 400 K/uL Final  . Neutrophils Relative % 10/25/2014 71  43 - 77 % Final  . Neutro Abs 10/25/2014 3.9  1.7 - 7.7 K/uL Final  . Lymphocytes  Relative 10/25/2014 19  12 - 46 % Final  . Lymphs Abs 10/25/2014 1.0  0.7 - 4.0 K/uL Final  . Monocytes Relative 10/25/2014 6  3 - 12 % Final  . Monocytes Absolute 10/25/2014 0.3  0.1 - 1.0 K/uL Final  . Eosinophils Relative 10/25/2014 3  0 - 5 % Final  . Eosinophils Absolute 10/25/2014 0.2  0.0 - 0.7 K/uL Final  . Basophils Relative 10/25/2014 1  0 - 1 % Final  . Basophils Absolute 10/25/2014 0.0  0.0 - 0.1 K/uL Final  . Sodium 10/25/2014 142  137 - 147 mEq/L Final  . Potassium 10/25/2014 3.9  3.7 - 5.3 mEq/L Final  . Chloride 10/25/2014 105  96 - 112 mEq/L Final  . CO2 10/25/2014 24  19 - 32 mEq/L Final  . Glucose, Bld 10/25/2014 118* 70 - 99 mg/dL Final  . BUN 10/25/2014 13  6 - 23 mg/dL Final  . Creatinine, Ser 10/25/2014 1.08  0.50 - 1.35 mg/dL Final  . Calcium 10/25/2014 9.3  8.4 - 10.5 mg/dL Final  . Total Protein 10/25/2014 7.2  6.0 - 8.3  g/dL Final  . Albumin 10/25/2014 3.3* 3.5 - 5.2 g/dL Final  . AST 10/25/2014 17  0 - 37 U/L Final  . ALT 10/25/2014 22  0 - 53 U/L Final  . Alkaline Phosphatase 10/25/2014 74  39 - 117 U/L Final  . Total Bilirubin 10/25/2014 0.4  0.3 - 1.2 mg/dL Final  . GFR calc non Af Amer 10/25/2014 74* >90 mL/min Final  . GFR calc Af Amer 10/25/2014 86* >90 mL/min Final   Comment: (NOTE)                          The eGFR has been calculated using the CKD EPI equation.                          This calculation has not been validated in all clinical situations.                          eGFR's persistently <90 mL/min signify possible Chronic Kidney                          Disease.  . Anion gap 10/25/2014 13  5 - 15 Final  Infusion on 10/11/2014  Component Date Value Ref Range Status  . Specific Gravity, Urine 10/11/2014 1.010  1.005 - 1.030 Final  . pH 10/11/2014 6.0  5.0 - 8.0 Final  . Glucose, UA 10/11/2014 NEGATIVE  NEGATIVE mg/dL Final  . Hgb urine dipstick 10/11/2014 NEGATIVE  NEGATIVE Final  . Bilirubin Urine 10/11/2014 NEGATIVE  NEGATIVE Final  . Ketones, ur 10/11/2014 NEGATIVE  NEGATIVE mg/dL Final  . Protein, ur 10/11/2014 NEGATIVE  NEGATIVE mg/dL Final  . Urobilinogen, UA 10/11/2014 0.2  0.0 - 1.0 mg/dL Final  . Nitrite 10/11/2014 NEGATIVE  NEGATIVE Final  . Leukocytes, UA 10/11/2014 NEGATIVE  NEGATIVE Final  Hospital Outpatient Visit on 10/10/2014  Component Date Value Ref Range Status  . WBC 10/10/2014 11.2* 4.0 - 10.5 K/uL Final  . RBC 10/10/2014 5.31  4.22 - 5.81 MIL/uL Final  . Hemoglobin 10/10/2014 15.8  13.0 - 17.0 g/dL Final  . HCT 10/10/2014 45.7  39.0 - 52.0 % Final  . MCV 10/10/2014 86.1  78.0 - 100.0 fL Final  . [facility] 10/10/2014  29.8  26.0 - 34.0 pg Final  . MCHC 10/10/2014 34.6  30.0 - 36.0 g/dL Final  . RDW 10/10/2014 14.3  11.5 - 15.5 % Final  . Platelets 10/10/2014 183  150 - 400 K/uL Final  . Neutrophils Relative % 10/10/2014 82* 43 - 77 % Final  . Neutro Abs  10/10/2014 9.2* 1.7 - 7.7 K/uL Final  . Lymphocytes Relative 10/10/2014 13  12 - 46 % Final  . Lymphs Abs 10/10/2014 1.4  0.7 - 4.0 K/uL Final  . Monocytes Relative 10/10/2014 4  3 - 12 % Final  . Monocytes Absolute 10/10/2014 0.5  0.1 - 1.0 K/uL Final  . Eosinophils Relative 10/10/2014 1  0 - 5 % Final  . Eosinophils Absolute 10/10/2014 0.1  0.0 - 0.7 K/uL Final  . Basophils Relative 10/10/2014 0  0 - 1 % Final  . Basophils Absolute 10/10/2014 0.0  0.0 - 0.1 K/uL Final  . Prothrombin Time 10/10/2014 13.2  11.6 - 15.2 seconds Final  . INR 10/10/2014 0.99  0.00 - 1.49 Final    PATHOLOGY:glioblastoma multiforme  Urinalysis    Component Value Date/Time   LABSPEC 1.025 11/08/2014 1001   PHURINE 5.5 11/08/2014 1001   GLUCOSEU NEGATIVE 11/08/2014 1001   HGBUR NEGATIVE 11/08/2014 1001   BILIRUBINUR NEGATIVE 11/08/2014 1001   KETONESUR NEGATIVE 11/08/2014 1001   PROTEINUR NEGATIVE 11/08/2014 1001   UROBILINOGEN 0.2 11/08/2014 1001   NITRITE NEGATIVE 11/08/2014 1001   LEUKOCYTESUR NEGATIVE 11/08/2014 1001    RADIOGRAPHIC STUDIES: Ir Fluoro Guide Cv Line Right  10/10/2014   CLINICAL DATA:  Recurrent glioblastoma and need for porta cath for chemotherapy.  EXAM: IMPLANTED PORT A CATH PLACEMENT WITH ULTRASOUND AND FLUOROSCOPIC GUIDANCE  ANESTHESIA/SEDATION: 4.0 Mg IV Versed; 100 mcg IV Fentanyl  Total Moderate Sedation Time:  35 minutes  Additional Medications: 2 g IV Ancef. As antibiotic prophylaxis, Ancef was ordered pre-procedure and administered intravenously within one hour of incision.  FLUOROSCOPY TIME:  18 seconds.  PROCEDURE: The procedure, risks, benefits, and alternatives were explained to the patient. Questions regarding the procedure were encouraged and answered. The patient understands and consents to the procedure.  The right neck and chest were prepped with chlorhexidine in a sterile fashion, and a sterile drape was applied covering the operative field. Maximum barrier sterile  technique with sterile gowns and gloves were used for the procedure. A time-out was performed. Local anesthesia was provided with 1% lidocaine.  Ultrasound was used to confirm patency of the right internal jugular vein. After creating a small venotomy incision, a 21 gauge needle was advanced into the right internal jugular vein under direct, real-time ultrasound guidance. Ultrasound image documentation was performed. After securing guidewire access, an 8 Fr dilator was placed. A J-wire was kinked to measure appropriate catheter length.  A subcutaneous port pocket was then created along the upper chest wall utilizing sharp and blunt dissection. Portable cautery was utilized. The pocket was irrigated with sterile saline.  A single lumen power injectable port was chosen for placement. The 8 Fr catheter was tunneled from the port pocket site to the venotomy incision. The port was placed in the pocket. External catheter was trimmed to appropriate length based on guidewire measurement.  At the venotomy, an 8 Fr peel-away sheath was placed over a guidewire. The catheter was then placed through the sheath and the sheath removed. Final catheter positioning was confirmed and documented with a fluoroscopic spot image. The port was accessed with a needle and aspirated and  flushed with heparinized saline. The needle was removed.  The venotomy and port pocket incisions were closed with subcutaneous 3-0 Monocryl and subcuticular 4-0 Vicryl. Dermabond was applied to both incisions.  COMPLICATIONS: None  FINDINGS: After catheter placement, the tip lies at the cavoatrial junction. The catheter aspirates normally and is ready for immediate use.  IMPRESSION: Placement of single lumen port a cath via right internal jugular vein. The catheter tip lies at the cavoatrial junction. A power injectable port a cath was placed and is ready for immediate use.   Electronically Signed   By: Aletta Edouard M.D.   On: 10/10/2014 14:22   Ir US  Guide Vasc Access Right  10/10/2014   CLINICAL DATA:  Recurrent glioblastoma and need for porta cath for chemotherapy.  EXAM: IMPLANTED PORT A CATH PLACEMENT WITH ULTRASOUND AND FLUOROSCOPIC GUIDANCE  ANESTHESIA/SEDATION: 4.0 Mg IV Versed; 100 mcg IV Fentanyl  Total Moderate Sedation Time:  35 minutes  Additional Medications: 2 g IV Ancef. As antibiotic prophylaxis, Ancef was ordered pre-procedure and administered intravenously within one hour of incision.  FLUOROSCOPY TIME:  18 seconds.  PROCEDURE: The procedure, risks, benefits, and alternatives were explained to the patient. Questions regarding the procedure were encouraged and answered. The patient understands and consents to the procedure.  The right neck and chest were prepped with chlorhexidine in a sterile fashion, and a sterile drape was applied covering the operative field. Maximum barrier sterile technique with sterile gowns and gloves were used for the procedure. A time-out was performed. Local anesthesia was provided with 1% lidocaine.  Ultrasound was used to confirm patency of the right internal jugular vein. After creating a small venotomy incision, a 21 gauge needle was advanced into the right internal jugular vein under direct, real-time ultrasound guidance. Ultrasound image documentation was performed. After securing guidewire access, an 8 Fr dilator was placed. A J-wire was kinked to measure appropriate catheter length.  A subcutaneous port pocket was then created along the upper chest wall utilizing sharp and blunt dissection. Portable cautery was utilized. The pocket was irrigated with sterile saline.  A single lumen power injectable port was chosen for placement. The 8 Fr catheter was tunneled from the port pocket site to the venotomy incision. The port was placed in the pocket. External catheter was trimmed to appropriate length based on guidewire measurement.  At the venotomy, an 8 Fr peel-away sheath was placed over a guidewire. The  catheter was then placed through the sheath and the sheath removed. Final catheter positioning was confirmed and documented with a fluoroscopic spot image. The port was accessed with a needle and aspirated and flushed with heparinized saline. The needle was removed.  The venotomy and port pocket incisions were closed with subcutaneous 3-0 Monocryl and subcuticular 4-0 Vicryl. Dermabond was applied to both incisions.  COMPLICATIONS: None  FINDINGS: After catheter placement, the tip lies at the cavoatrial junction. The catheter aspirates normally and is ready for immediate use.  IMPRESSION: Placement of single lumen port a cath via right internal jugular vein. The catheter tip lies at the cavoatrial junction. A power injectable port a cath was placed and is ready for immediate use.   Electronically Signed   By: Aletta Edouard M.D.   On: 10/10/2014 14:22    ASSESSMENT:  #1. Recurrent glioblastoma multiforme left frontal lobe, status post Avastin on 09/27/2014 with plans to deliver Avastin plus Irinotecan every 2 weeks, status post Avastin/irinotecan on 10/11/2014 with good tolerance  #2. Chronic obstructive pulmonary disease,  still smoking.  #3. Seizure disorder, on treatment.  #4. Gout, controlled.  #5. Benign prostatic hypertrophy, on treatment with Flomax.  #6. Anxiety neurosis, controlled with diazepam. #7. Allergic rhinitis, currently asymptomatic on therapy.   PLAN:  #1. Cycle #3 of irinotecan/Avastin today. #2. Continue oxycodone, Valium, Keppra. #3. Follow-up at [facility] with MRI and visit on 12/06/2014. #4. Follow-up in 2 weeks for continuation of chemotherapy.   All questions were answered. The patient knows to call the clinic with any problems, questions or concerns. We can certainly see the patient much sooner if necessary.   I spent 25 minutes counseling the patient face to face. The total time spent in the appointment was 30 minutes.    Doroteo Bradford, MD 11/08/2014 10:46  AM  DISCLAIMER:  This note was dictated with voice recognition software.  Similar sounding words can inadvertently be transcribed inaccurately and may not be corrected upon review.

## 2014-11-09 ENCOUNTER — Other Ambulatory Visit (HOSPITAL_COMMUNITY): Payer: Self-pay | Admitting: Hematology and Oncology

## 2014-11-09 ENCOUNTER — Telehealth (HOSPITAL_COMMUNITY): Payer: Self-pay | Admitting: *Deleted

## 2014-11-09 MED ORDER — DIAZEPAM 10 MG PO TABS: 10.0000 mg | ORAL_TABLET | Freq: Every day | ORAL | Status: DC

## 2014-11-19 NOTE — Progress Notes (Signed)
Cody Hampshire, MD [place] [address]  Malignant brain tumor, left frontal glioblastoma multiforme grade 4 - Plan: oxycodone (OXY-IR) 5 MG capsule  CURRENT THERAPY: Irinotecan/Avastin every 2 weeks. Previously treated with Temodar for 5 days every 28 days for 2 cycles plus leukocyte vaccine for 2 doses at [facility] per REGULATEe trial with repeat MRI on 08/23/2014 showing progression of disease. He was started on Avastin 10 mg per kilogram on 09/27/2014 with plans to continue Avastin every 2 weeks together with irinotecan 125 mg per meter squared every 2 weeks.  INTERVAL HISTORY: Cody Buchanan 58 y.o. male returns for  regular  visit for followup of recurrent glioblastoma multiforme.   Oncology History   03/03/2014 Partial resection, [name], [place], Dr. [name] 04/07/2014 Re-resection at [place], Dr. [name]     Malignant brain tumor, left frontal glioblastoma multiforme grade 4   03/03/2014 Initial Diagnosis Malignant brain tumor, left frontal glioblastoma multiforme grade 4, [place], [place]. partial resection, Dr. [name]   04/07/2014 Surgery Re-resection at [place], Dr. Derrill Memo   05/10/2014 - 06/21/2014 Radiation Therapy By Dr. Sondra Come with 6000 cGy in 30 fractions to left frontal brain   07/07/2014 -  Chemotherapy Temodar x5 days every 4 weeks for 2 cycles +2 infusions of patient's own lymphocytes.   08/23/2014 Progression MRI showed progression at [facility]   09/27/2014 -  Chemotherapy Avastin started with irinotecan added on 10/11/2014    I personally reviewed and went over laboratory results with the patient.  The results are noted within this dictation.  The patient called our clinic the other day reporting a cyst in his groin. I recommended that he follow-up with his primary care physician or surgeon regarding this. He saw Dr. Romona Curls recently and the patient reports that this was lanced. I do not have Dr.  Hulan Amato note at this time.  Cody Buchanan reports she is doing very well. He continues to tolerate chemotherapy without any difficulties. He denies any diarrhea or excess loose stools.  On 12/06/2014 he has an appointment at [facility] for repeat MRI scans. This interferes with his every two-week treatment plan and therefore this is been adjusted to accommodate this appointment up. We will defer histreatment in 2 weeks and administer in 3 weeks depending on his MRI results and recommendations from [place]. The patient is agreeable to this and understands this plan of action.  Oncologically, the patient denies any complaints and are less questioning is negative. He denies any neurologic changes.    Past Medical History  Diagnosis Date  . Glioblastoma   . GERD (gastroesophageal reflux disease)   . Seizures   . Anxiety 09/07/2014    has Malignant brain tumor, left frontal glioblastoma multiforme grade 4; Chronic obstructive pulmonary disease; Seizure disorder; Allergic rhinitis; Acute gouty arthritis; and Anxiety on his problem list.     has No Known Allergies.  Cody Buchanan had no medications administered during this visit.  Past Surgical History  Procedure Laterality Date  . Salivary gland surgery    . Brain tumor excision      Denies any headaches, dizziness, double vision, fevers, chills, night sweats, nausea, vomiting, diarrhea, constipation, chest pain, heart palpitations, shortness of breath, blood in stool, black tarry stool, urinary pain, urinary burning, urinary frequency, hematuria.   PHYSICAL EXAMINATION  ECOG PERFORMANCE STATUS: 1 - Symptomatic but completely ambulatory  There were no vitals filed for this visit.  GENERAL:alert, no distress, well nourished,  well developed, comfortable, cooperative, smiling and eating a biscuit while in a chemotherapy bed receiving pre-meds in preparation for chemotherapy. SKIN: skin color, texture, turgor are normal, no rashes or significant  lesions HEAD: Normocephalic, No masses, lesions, tenderness or abnormalities EYES: normal, PERRLA, EOMI, Conjunctiva are pink and non-injected EARS: External ears normal OROPHARYNX:mucous membranes are moist  NECK: supple, trachea midline LYMPH:  not examined BREAST:not examined LUNGS: clear to auscultation  HEART: regular rate & rhythm, no murmurs and no gallops ABDOMEN:abdomen soft, non-tender and normal bowel sounds BACK: Back symmetric, no curvature. EXTREMITIES:less then 2 second capillary refill, no joint deformities, effusion, or inflammation, no skin discoloration, no cyanosis  NEURO: alert & oriented x 3 with fluent speech, no focal motor/sensory deficits   LABORATORY DATA: CBC    Component Value Date/Time   WBC 6.7 11/22/2014 0855   RBC 4.64 11/22/2014 0855   HGB 13.6 11/22/2014 0855   HCT 39.6 11/22/2014 0855   PLT 227 11/22/2014 0855   MCV 85.3 11/22/2014 0855   MCH 29.3 11/22/2014 0855   MCHC 34.3 11/22/2014 0855   RDW 15.5 11/22/2014 0855   LYMPHSABS 1.4 11/22/2014 0855   MONOABS 0.5 11/22/2014 0855   EOSABS 0.1 11/22/2014 0855   BASOSABS 0.0 11/22/2014 0855      Chemistry      Component Value Date/Time   NA 138 11/22/2014 0855   K 3.8 11/22/2014 0855   CL 104 11/22/2014 0855   CO2 22 11/22/2014 0855   BUN 11 11/22/2014 0855   CREATININE 1.03 11/22/2014 0855      Component Value Date/Time   CALCIUM 8.8 11/22/2014 0855   ALKPHOS 69 11/22/2014 0855   AST 14 11/22/2014 0855   ALT 15 11/22/2014 0855   BILITOT 0.4 11/22/2014 0855        ASSESSMENT:  1. Recurrent glioblastoma multiforme left frontal lobe, status post Avastin on 09/27/2014 with plans to deliver Avastin plus Irinotecan every 2 weeks, status post Avastin/irinotecan on 10/11/2014 with good tolerance  2. Chronic obstructive pulmonary disease, still smoking.  3. Seizure disorder, on treatment.  4. Gout, controlled.  5. Benign prostatic hypertrophy, on treatment with Flomax.  6.  Anxiety neurosis, controlled with diazepam. 7. Allergic rhinitis, currently asymptomatic on therapy. 8. Groin "cyst" addressed by local Gen Surgeon, Dr. Romona Curls.  Patient Active Problem List   Diagnosis Date Noted  . Anxiety 09/07/2014  . Acute gouty arthritis 05/18/2014  . Allergic rhinitis 04/21/2014  . Malignant brain tumor, left frontal glioblastoma multiforme grade 4 04/13/2014  . Chronic obstructive pulmonary disease 04/13/2014  . Seizure disorder 04/13/2014     PLAN: 1. I personally reviewed and went over laboratory results with the patient.  The results are noted within this dictation. 2. Pre-chemo labs as planned 3. Chemotherapy today as scheduled. 4. Follow-up at [facility] with MRI and visit on 12/06/2014. 5. Treatment plan altered to provide patient a 1 week break in therapy for his 12/06/14 [facility] appointment for restaging MRI scans. 6. Rx for Oxycodone 7. Return in 3 weeks for follow-up and chemotherapy depending on MRI results on 12/9.   THERAPY PLAN:  Follow-up at [facility] as planned on 12/06/2014  All questions were answered. The patient knows to call the clinic with any problems, questions or concerns. We can certainly see the patient much sooner if necessary.  Patient and plan discussed with Dr. Farrel Gobble and he is in agreement with the aforementioned.   Cody Buchanan 11/22/2014

## 2014-11-22 ENCOUNTER — Encounter: Payer: Self-pay | Admitting: *Deleted

## 2014-11-22 ENCOUNTER — Inpatient Hospital Stay (HOSPITAL_COMMUNITY): Payer: Medicaid Other

## 2014-11-22 ENCOUNTER — Encounter (HOSPITAL_BASED_OUTPATIENT_CLINIC_OR_DEPARTMENT_OTHER): Payer: Medicaid Other

## 2014-11-22 ENCOUNTER — Encounter (HOSPITAL_COMMUNITY): Payer: Self-pay

## 2014-11-22 ENCOUNTER — Encounter (HOSPITAL_BASED_OUTPATIENT_CLINIC_OR_DEPARTMENT_OTHER): Payer: Medicaid Other | Admitting: Oncology

## 2014-11-22 ENCOUNTER — Encounter (HOSPITAL_COMMUNITY): Payer: Self-pay | Admitting: Hematology and Oncology

## 2014-11-22 DIAGNOSIS — C719 Malignant neoplasm of brain, unspecified: Secondary | ICD-10-CM

## 2014-11-22 DIAGNOSIS — IMO0002 Reserved for concepts with insufficient information to code with codable children: Secondary | ICD-10-CM

## 2014-11-22 DIAGNOSIS — C711 Malignant neoplasm of frontal lobe: Secondary | ICD-10-CM

## 2014-11-22 DIAGNOSIS — Z720 Tobacco use: Secondary | ICD-10-CM

## 2014-11-22 DIAGNOSIS — Z5112 Encounter for antineoplastic immunotherapy: Secondary | ICD-10-CM

## 2014-11-22 DIAGNOSIS — Z5111 Encounter for antineoplastic chemotherapy: Secondary | ICD-10-CM

## 2014-11-22 LAB — COMPREHENSIVE METABOLIC PANEL
ALK PHOS: 69 U/L (ref 39–117)
ALT: 15 U/L (ref 0–53)
AST: 14 U/L (ref 0–37)
Albumin: 3.5 g/dL (ref 3.5–5.2)
Anion gap: 12 (ref 5–15)
BUN: 11 mg/dL (ref 6–23)
CHLORIDE: 104 meq/L (ref 96–112)
CO2: 22 mEq/L (ref 19–32)
CREATININE: 1.03 mg/dL (ref 0.50–1.35)
Calcium: 8.8 mg/dL (ref 8.4–10.5)
GFR calc Af Amer: >90 mL/min (ref >90)
GFR calc non Af Amer: 79 mL/min — ABNORMAL LOW (ref >90)
Glucose, Bld: 89 mg/dL (ref 70–99)
POTASSIUM: 3.8 meq/L (ref 3.7–5.3)
SODIUM: 138 meq/L (ref 137–147)
Total Bilirubin: 0.4 mg/dL (ref 0.3–1.2)
Total Protein: 6.8 g/dL (ref 6.0–8.3)

## 2014-11-22 LAB — URINALYSIS, DIPSTICK ONLY
Bilirubin Urine: NEGATIVE
Glucose, UA: NEGATIVE mg/dL
Hgb urine dipstick: NEGATIVE
KETONES UR: NEGATIVE mg/dL
LEUKOCYTES UA: NEGATIVE
NITRITE: NEGATIVE
PROTEIN: NEGATIVE mg/dL
Specific Gravity, Urine: 1.015 (ref 1.005–1.030)
Urobilinogen, UA: 0.2 mg/dL (ref 0.0–1.0)
pH: 5.5 (ref 5.0–8.0)

## 2014-11-22 LAB — CBC WITH DIFFERENTIAL/PLATELET
BASOS PCT: 1 % (ref 0–1)
Basophils Absolute: 0 10*3/uL (ref 0.0–0.1)
Eosinophils Absolute: 0.1 10*3/uL (ref 0.0–0.7)
Eosinophils Relative: 1 % (ref 0–5)
HCT: 39.6 % (ref 39.0–52.0)
Hemoglobin: 13.6 g/dL (ref 13.0–17.0)
Lymphocytes Relative: 21 % (ref 12–46)
Lymphs Abs: 1.4 10*3/uL (ref 0.7–4.0)
MCH: 29.3 pg (ref 26.0–34.0)
MCHC: 34.3 g/dL (ref 30.0–36.0)
MCV: 85.3 fL (ref 78.0–100.0)
Monocytes Absolute: 0.5 10*3/uL (ref 0.1–1.0)
Monocytes Relative: 8 % (ref 3–12)
NEUTROS ABS: 4.7 10*3/uL (ref 1.7–7.7)
NEUTROS PCT: 69 % (ref 43–77)
PLATELETS: 227 10*3/uL (ref 150–400)
RBC: 4.64 MIL/uL (ref 4.22–5.81)
RDW: 15.5 % (ref 11.5–15.5)
WBC: 6.7 10*3/uL (ref 4.0–10.5)

## 2014-11-22 MED ORDER — SODIUM CHLORIDE 0.9 % IV SOLN
Freq: Once | INTRAVENOUS | Status: AC
  Administered 2014-11-22: 09:00:00 via INTRAVENOUS

## 2014-11-22 MED ORDER — IRINOTECAN HCL CHEMO INJECTION 100 MG/5ML
125.0000 mg/m2 | Freq: Once | INTRAVENOUS | Status: AC
  Administered 2014-11-22: 252 mg via INTRAVENOUS
  Filled 2014-11-22: qty 12.6

## 2014-11-22 MED ORDER — BEVACIZUMAB CHEMO INJECTION 400 MG/16ML
10.0000 mg/kg | Freq: Once | INTRAVENOUS | Status: AC
  Administered 2014-11-22: 850 mg via INTRAVENOUS
  Filled 2014-11-22: qty 34

## 2014-11-22 MED ORDER — SODIUM CHLORIDE 0.9 % IJ SOLN: 10.0000 mL | INTRAMUSCULAR | Status: DC | PRN

## 2014-11-22 MED ORDER — HEPARIN SOD (PORK) LOCK FLUSH 100 UNIT/ML IV SOLN
500.0000 [IU] | Freq: Once | INTRAVENOUS | Status: AC | PRN
  Administered 2014-11-22: 500 [IU]
  Filled 2014-11-22: qty 5

## 2014-11-22 MED ORDER — ATROPINE SULFATE 1 MG/ML IJ SOLN: 0.5000 mg | Freq: Once | INTRAMUSCULAR | Status: DC | PRN

## 2014-11-22 MED ORDER — SODIUM CHLORIDE 0.9 % IV SOLN
Freq: Once | INTRAVENOUS | Status: AC
  Administered 2014-11-22: 8 mg via INTRAVENOUS
  Filled 2014-11-22: qty 4

## 2014-11-22 MED ORDER — OXYCODONE HCL 5 MG PO CAPS: 5.0000 mg | ORAL_CAPSULE | Freq: Four times a day (QID) | ORAL | Status: DC | PRN

## 2014-11-22 NOTE — Progress Notes (Signed)
Patient tolerated infusions well.

## 2014-11-22 NOTE — Patient Instructions (Signed)
[facility] Discharge Instructions  RECOMMENDATIONS MADE BY THE CONSULTANT AND ANY TEST RESULTS WILL BE SENT TO YOUR REFERRING PHYSICIAN.  Laboratory work today is very stable and within normal limits. Therefore treatment will be given today. Please call the clinic with any questions or concerns regarding her treatment and/or cancer. Report to the emergency room for any emergencies. Follow-up at [facility] on 12/06/2014 for follow-up and repeat MRI of brain imaging tests. We will postpone your next chemotherapy by 1 week to allow for your [facility] appointment on 12/06/2014. I provide you a prescription with your pain medication. Return to [facility] on 12/13/2014 for chemotherapy (depending on MRI results and [facility] recommendations) and follow-up appointment to review data from [facility].  Thank you for choosing [facility] to provide your oncology and hematology care.  To afford each patient quality time with our providers, please arrive at least 15 minutes before your scheduled appointment time.  With your help, our goal is to use those 15 minutes to complete the necessary work-up to ensure our physicians have the information they need to help with your evaluation and healthcare recommendations.    Effective January 1st, 2014, we ask that you re-schedule your appointment with our physicians should you arrive 10 or more minutes late for your appointment.  We strive to give you quality time with our providers, and arriving late affects you and other patients whose appointments are after yours.    Again, thank you for choosing [facility].  Our hope is that these requests will decrease the amount of time that you wait before being seen by our physicians.       _____________________________________________________________  Should you have questions after your visit to [facility], please contact our  office at [phone] between the hours of 8:30 a.m. and 5:00 p.m.  Voicemails left after 4:30 p.m. will not be returned until the following business day.  For prescription refill requests, have your pharmacy contact our office with your prescription refill request.

## 2014-11-22 NOTE — Progress Notes (Signed)
[place] Clinical Social Work  Clinical Social Work was referred by [place] rounding for re-assessment of psychosocial needs due to past financial concerns.  Clinical Social Worker met with patient at [facility] to offer support and assess for needs. Pt reports things are better financially due to now getting his full SSI amount each month. He states he is getting caught up from when he had gotten behind. Pt reports his church is very involved and supportive, they may assist with meals in the weeks ahead. He denied other concerns currently and appreciated the follow up. CSW will continue to be available as needed.    Clinical Social Work interventions: Reassess needs   Loren Racer, [place] Tuesdays 8:30-1pm Wednesdays 8:30-12pm  [phone]

## 2014-11-22 NOTE — Patient Instructions (Signed)
[facility] Discharge Instructions for Patients Receiving Chemotherapy  Today you received the following chemotherapy agents irinotecan and bevacizumab. Please follow up as scheduled.  Follow up with [facility] as scheduled.     If you develop nausea and vomiting, or diarrhea that is not controlled by your medication, call the clinic.  The clinic phone number is [phone]. Office hours are Monday-Friday 8:30am-5:00pm.  BELOW ARE SYMPTOMS THAT SHOULD BE REPORTED IMMEDIATELY:  *FEVER GREATER THAN 101.0 F  *CHILLS WITH OR WITHOUT FEVER  NAUSEA AND VOMITING THAT IS NOT CONTROLLED WITH YOUR NAUSEA MEDICATION  *UNUSUAL SHORTNESS OF BREATH  *UNUSUAL BRUISING OR BLEEDING  TENDERNESS IN MOUTH AND THROAT WITH OR WITHOUT PRESENCE OF ULCERS  *URINARY PROBLEMS  *BOWEL PROBLEMS  UNUSUAL RASH Items with * indicate a potential emergency and should be followed up as soon as possible. If you have an emergency after office hours please contact your primary care physician or go to the nearest emergency department.  Please call the clinic during office hours if you have any questions or concerns.   You may also contact the Patient Navigator at [phone] should you have any questions or need assistance in obtaining follow up care. _____________________________________________________________________ Have you asked about our STAR program?    STAR stands for Survivorship Training and Rehabilitation, and this is a nationally recognized cancer care program that focuses on survivorship and rehabilitation.  Cancer and cancer treatments may cause problems, such as, pain, making you feel tired and keeping you from doing the things that you need or want to do. Cancer rehabilitation can help. Our goal is to reduce these troubling effects and help you have the best quality of life possible.  You may receive a survey from a nurse that asks questions about your current state of health.  Based  on the survey results, all eligible patients will be referred to the [facility] program for an evaluation so we can better serve you! A frequently asked questions sheet is available upon request.

## 2014-11-24 ENCOUNTER — Telehealth: Payer: Self-pay | Admitting: Nutrition

## 2014-11-24 NOTE — Telephone Encounter (Signed)
Patient was identified at risk for malnutrition on the MST. Contacted patient by phone but he was not available. Left message with my name and phone number for patient to return call if nutrition information needed.

## 2014-11-30 ENCOUNTER — Inpatient Hospital Stay (HOSPITAL_COMMUNITY): Payer: Medicaid Other

## 2014-12-04 ENCOUNTER — Other Ambulatory Visit (HOSPITAL_COMMUNITY): Payer: Self-pay | Admitting: Oncology

## 2014-12-04 DIAGNOSIS — C719 Malignant neoplasm of brain, unspecified: Secondary | ICD-10-CM

## 2014-12-04 MED ORDER — OXYCODONE HCL 5 MG PO CAPS: 5.0000 mg | ORAL_CAPSULE | ORAL | Status: DC | PRN

## 2014-12-07 ENCOUNTER — Other Ambulatory Visit (HOSPITAL_COMMUNITY): Payer: Self-pay | Admitting: Hematology and Oncology

## 2014-12-10 NOTE — Progress Notes (Addendum)
Cody Hampshire, MD [place] [address]  Malignant brain tumor, left frontal glioblastoma multiforme grade 4  CURRENT THERAPY: Irinotecan/Avastin every 2 weeks. Previously treated with Temodar for 5 days every 28 days for 2 cycles plus leukocyte vaccine for 2 doses at [facility] per REGULATEe trial with repeat MRI on 08/23/2014 showing progression of disease. He was started on Avastin 10 mg per kilogram on 09/27/2014 with plans to continue Avastin every 2 weeks together with irinotecan 125 mg per meter squared every 2 weeks.  INTERVAL HISTORY: Cody Buchanan 58 y.o. male returns for  regular  visit for followup of recurrent glioblastoma multiforme.   Oncology History   03/03/2014 Partial resection, Cody Buchanan, [place], Dr. Rene Paci Buchanan 04/07/2014 Re-resection at [place], Dr. Yetta Buchanan     Malignant brain tumor, left frontal glioblastoma multiforme grade 4   03/03/2014 Initial Diagnosis Malignant brain tumor, left frontal glioblastoma multiforme grade 4, [place], [place]. partial resection, Dr. Rene Paci Buchanan   04/07/2014 Surgery Re-resection at [place], Dr. Derrill Buchanan   05/10/2014 - 06/21/2014 Radiation Therapy By Dr. Sondra Buchanan with 6000 cGy in 30 fractions to left frontal brain   07/07/2014 -  Chemotherapy Temodar x5 days every 4 weeks for 2 cycles +2 infusions of patient's own lymphocytes.   08/23/2014 Progression MRI showed progression at [facility]   09/27/2014 -  Chemotherapy Avastin started with irinotecan added on 10/11/2014    I personally reviewed and went over laboratory results with the patient.  The results are noted within this dictation.  I reviewed the patient's [employer] notes. They state a stable MRI of brain without any neurological deficits or changes. It is recommended to continue with Avastin and irinotecan therapy every 2 weeks as we've been doing.  I have documentation here that the patient will have a follow-up  appointment at [facility] on February 07, 2015 with an MRI as well as an office visit with Dr. Fredric Buchanan.  He is tolerating therapy well without any complaints. Oncologically, he denies any complaints and ROS questioning is negative.  Past Medical History  Diagnosis Date  . Glioblastoma   . GERD (gastroesophageal reflux disease)   . Seizures   . Anxiety 09/07/2014    has Malignant brain tumor, left frontal glioblastoma multiforme grade 4; Chronic obstructive pulmonary disease; Seizure disorder; Allergic rhinitis; Acute gouty arthritis; and Anxiety on his problem list.     has No Known Allergies.  Cody Buchanan does not currently have medications on file.  Past Surgical History  Procedure Laterality Date  . Salivary gland surgery    . Brain tumor excision      Denies any headaches, dizziness, double vision, fevers, chills, night sweats, nausea, vomiting, diarrhea, constipation, chest pain, heart palpitations, shortness of breath, blood in stool, black tarry stool, urinary pain, urinary burning, urinary frequency, hematuria.   PHYSICAL EXAMINATION  ECOG PERFORMANCE STATUS: 0 - Asymptomatic  There were no vitals filed for this visit.  GENERAL:alert, no distress, well nourished, well developed, comfortable, cooperative and smiling, in chemotherapy bed receiving pre-meds. SKIN: skin color, texture, turgor are normal, no rashes or significant lesions HEAD: Normocephalic, No masses, lesions, tenderness or abnormalities EYES: normal, PERRLA, EOMI, Conjunctiva are pink and non-injected EARS: External ears normal OROPHARYNX:mucous membranes are moist  NECK: supple, trachea midline LYMPH:  not examined BREAST:not examined LUNGS: Not examined HEART: not examined ABDOMEN:abdomen soft and normal bowel sounds BACK: Back symmetric, no curvature. EXTREMITIES:less then 2 second  capillary refill, no joint deformities, effusion, or inflammation, no skin discoloration, no cyanosis    NEURO: alert & oriented x 3 with fluent speech, no focal motor/sensory deficits    LABORATORY DATA: CBC    Component Value Date/Time   WBC 8.8 12/13/2014 0905   RBC 4.86 12/13/2014 0905   HGB 14.6 12/13/2014 0905   HCT 43.9 12/13/2014 0905   PLT 227 12/13/2014 0905   MCV 90.3 12/13/2014 0905   MCH 30.0 12/13/2014 0905   MCHC 33.3 12/13/2014 0905   RDW 16.0* 12/13/2014 0905   LYMPHSABS 1.2 12/13/2014 0905   MONOABS 0.8 12/13/2014 0905   EOSABS 0.1 12/13/2014 0905   BASOSABS 0.1 12/13/2014 0905      Chemistry      Component Value Date/Time   NA 138 11/22/2014 0855   K 3.8 11/22/2014 0855   CL 104 11/22/2014 0855   CO2 22 11/22/2014 0855   BUN 11 11/22/2014 0855   CREATININE 1.03 11/22/2014 0855      Component Value Date/Time   CALCIUM 8.8 11/22/2014 0855   ALKPHOS 69 11/22/2014 0855   AST 14 11/22/2014 0855   ALT 15 11/22/2014 0855   BILITOT 0.4 11/22/2014 0855      RADIOLOGY:  MRI of brain with and without contrast at [facility] on 12/06/2014 demonstrates: 1. Decreased enhancement, edema, and mass effect. This may represent pseudo-response related to interval chemotherapy versus partial response. 2. Small antrochoanal polyp in the left nasal cavity.  Read by: Cody Frederick, MD   ASSESSMENT:  1. Recurrent glioblastoma multiforme left frontal lobe, status post Avastin on 09/27/2014 with the addition of Irinotecan on 10/11/2014 every 2 weeks.  [place] visit on 12/06/2014 with MRI of brain demonstrates stability to disease and it is recommended to continue with chemotherapy as previously mentioned. 2. Chronic obstructive pulmonary disease, still smoking.  3. Seizure disorder, on treatment.  4. Gout, controlled.  5. Benign prostatic hypertrophy, on treatment with Flomax.  6. Anxiety neurosis, controlled with diazepam. 7. Allergic rhinitis, currently asymptomatic on therapy.  Patient Active Problem List   Diagnosis Date Noted  . Anxiety  09/07/2014  . Acute gouty arthritis 05/18/2014  . Allergic rhinitis 04/21/2014  . Malignant brain tumor, left frontal glioblastoma multiforme grade 4 04/13/2014  . Chronic obstructive pulmonary disease 04/13/2014  . Seizure disorder 04/13/2014     PLAN:  1. I personally reviewed and went over laboratory results with the patient.  The results are noted within this dictation. 2. Chart reviewed 3. Prechemotherapy labs as planned 4. Systemic chemotherapy as ordered. 5. Influenza vaccine today 6. Return in 4 weeks for follow-up with chemotherapy administered every 2 weeks.   THERAPY PLAN:  Per [facility] recommendations, we will continue with Avastin and irinotecan systemic chemotherapy every 2 weeks with evidence of stability of disease per MRI of brain on 12/06/2014. He will follow-up at [facility] on 02/07/2015.  All questions were answered. The patient knows to call the clinic with any problems, questions or concerns. We can certainly see the patient much sooner if necessary.  Patient and plan discussed with Dr. Farrel Gobble and he is in agreement with the aforementioned.   Cody Buchanan 12/13/2014

## 2014-12-11 ENCOUNTER — Other Ambulatory Visit (HOSPITAL_COMMUNITY): Payer: Self-pay | Admitting: Hematology and Oncology

## 2014-12-11 ENCOUNTER — Telehealth (HOSPITAL_COMMUNITY): Payer: Self-pay | Admitting: *Deleted

## 2014-12-11 MED ORDER — DIAZEPAM 10 MG PO TABS: 10.0000 mg | ORAL_TABLET | Freq: Every day | ORAL | Status: DC

## 2014-12-13 ENCOUNTER — Encounter (HOSPITAL_BASED_OUTPATIENT_CLINIC_OR_DEPARTMENT_OTHER): Payer: Medicaid Other | Admitting: Oncology

## 2014-12-13 ENCOUNTER — Encounter: Payer: Self-pay | Admitting: *Deleted

## 2014-12-13 ENCOUNTER — Encounter (HOSPITAL_COMMUNITY): Payer: Medicaid Other | Attending: Hematology and Oncology

## 2014-12-13 DIAGNOSIS — C711 Malignant neoplasm of frontal lobe: Secondary | ICD-10-CM

## 2014-12-13 DIAGNOSIS — C719 Malignant neoplasm of brain, unspecified: Secondary | ICD-10-CM

## 2014-12-13 DIAGNOSIS — Z0000 Encounter for general adult medical examination without abnormal findings: Secondary | ICD-10-CM

## 2014-12-13 DIAGNOSIS — Z720 Tobacco use: Secondary | ICD-10-CM

## 2014-12-13 DIAGNOSIS — Z23 Encounter for immunization: Secondary | ICD-10-CM

## 2014-12-13 DIAGNOSIS — Z5112 Encounter for antineoplastic immunotherapy: Secondary | ICD-10-CM

## 2014-12-13 DIAGNOSIS — Z5111 Encounter for antineoplastic chemotherapy: Secondary | ICD-10-CM

## 2014-12-13 LAB — COMPREHENSIVE METABOLIC PANEL
ALK PHOS: 73 U/L (ref 39–117)
ALT: 14 U/L (ref 0–53)
ANION GAP: 13 (ref 5–15)
AST: 16 U/L (ref 0–37)
Albumin: 3.6 g/dL (ref 3.5–5.2)
BILIRUBIN TOTAL: 0.5 mg/dL (ref 0.3–1.2)
BUN: 11 mg/dL (ref 6–23)
CO2: 24 meq/L (ref 19–32)
Calcium: 9 mg/dL (ref 8.4–10.5)
Chloride: 104 mEq/L (ref 96–112)
Creatinine, Ser: 1.15 mg/dL (ref 0.50–1.35)
GFR, EST AFRICAN AMERICAN: 80 mL/min — AB (ref >90)
GFR, EST NON AFRICAN AMERICAN: 69 mL/min — AB (ref >90)
Glucose, Bld: 88 mg/dL (ref 70–99)
POTASSIUM: 4 meq/L (ref 3.7–5.3)
SODIUM: 141 meq/L (ref 137–147)
Total Protein: 7.2 g/dL (ref 6.0–8.3)

## 2014-12-13 LAB — CBC WITH DIFFERENTIAL/PLATELET
Basophils Absolute: 0.1 10*3/uL (ref 0.0–0.1)
Basophils Relative: 1 % (ref 0–1)
Eosinophils Absolute: 0.1 10*3/uL (ref 0.0–0.7)
Eosinophils Relative: 2 % (ref 0–5)
HCT: 43.9 % (ref 39.0–52.0)
HEMOGLOBIN: 14.6 g/dL (ref 13.0–17.0)
LYMPHS PCT: 14 % (ref 12–46)
Lymphs Abs: 1.2 10*3/uL (ref 0.7–4.0)
MCH: 30 pg (ref 26.0–34.0)
MCHC: 33.3 g/dL (ref 30.0–36.0)
MCV: 90.3 fL (ref 78.0–100.0)
Monocytes Absolute: 0.8 10*3/uL (ref 0.1–1.0)
Monocytes Relative: 9 % (ref 3–12)
NEUTROS PCT: 74 % (ref 43–77)
Neutro Abs: 6.6 10*3/uL (ref 1.7–7.7)
PLATELETS: 227 10*3/uL (ref 150–400)
RBC: 4.86 MIL/uL (ref 4.22–5.81)
RDW: 16 % — ABNORMAL HIGH (ref 11.5–15.5)
WBC: 8.8 10*3/uL (ref 4.0–10.5)

## 2014-12-13 LAB — URINALYSIS, DIPSTICK ONLY
Bilirubin Urine: NEGATIVE
GLUCOSE, UA: NEGATIVE mg/dL
Hgb urine dipstick: NEGATIVE
Ketones, ur: NEGATIVE mg/dL
Leukocytes, UA: NEGATIVE
Nitrite: NEGATIVE
PH: 6 (ref 5.0–8.0)
Protein, ur: NEGATIVE mg/dL
SPECIFIC GRAVITY, URINE: 1.015 (ref 1.005–1.030)
Urobilinogen, UA: 0.2 mg/dL (ref 0.0–1.0)

## 2014-12-13 MED ORDER — SODIUM CHLORIDE 0.9 % IJ SOLN: 10.0000 mL | INTRAMUSCULAR | Status: DC | PRN

## 2014-12-13 MED ORDER — SODIUM CHLORIDE 0.9 % IV SOLN
Freq: Once | INTRAVENOUS | Status: AC
  Administered 2014-12-13: 09:00:00 via INTRAVENOUS

## 2014-12-13 MED ORDER — INFLUENZA VAC SPLIT QUAD 0.5 ML IM SUSY
PREFILLED_SYRINGE | INTRAMUSCULAR | Status: AC
  Filled 2014-12-13: qty 0.5

## 2014-12-13 MED ORDER — SODIUM CHLORIDE 0.9 % IV SOLN
10.0000 mg/kg | Freq: Once | INTRAVENOUS | Status: AC
  Administered 2014-12-13: 850 mg via INTRAVENOUS
  Filled 2014-12-13: qty 34

## 2014-12-13 MED ORDER — INFLUENZA VAC SPLIT QUAD 0.5 ML IM SUSY
0.5000 mL | PREFILLED_SYRINGE | Freq: Once | INTRAMUSCULAR | Status: AC
Start: 2014-12-13 — End: 2014-12-13
  Administered 2014-12-13: 0.5 mL via INTRAMUSCULAR

## 2014-12-13 MED ORDER — SODIUM CHLORIDE 0.9 % IV SOLN
Freq: Once | INTRAVENOUS | Status: AC
  Administered 2014-12-13: 8 mg via INTRAVENOUS
  Filled 2014-12-13: qty 4

## 2014-12-13 MED ORDER — INFLUENZA VAC SPLIT QUAD 0.5 ML IM SUSY: 0.5000 mL | PREFILLED_SYRINGE | INTRAMUSCULAR | Status: DC

## 2014-12-13 MED ORDER — HEPARIN SOD (PORK) LOCK FLUSH 100 UNIT/ML IV SOLN
500.0000 [IU] | Freq: Once | INTRAVENOUS | Status: AC | PRN
  Administered 2014-12-13: 500 [IU]
  Filled 2014-12-13: qty 5

## 2014-12-13 MED ORDER — DEXTROSE 5 % IV SOLN
125.0000 mg/m2 | Freq: Once | INTRAVENOUS | Status: AC
  Administered 2014-12-13: 252 mg via INTRAVENOUS
  Filled 2014-12-13: qty 4.2

## 2014-12-13 NOTE — Progress Notes (Signed)
[place] Clinical Social Work  Clinical Social Work was referred by [place] rounding for assessment of psychosocial needs due to past financial concerns.  Clinical Social Worker met with patient at [facility] during infusion to offer support and assess for needs.  Pt reports he received good news at [facility] recently and this has been inspiring for him. He also continues to have some financial concerns as he shared a recent bill from [facility]. CSW contacted pt's advocate at [facility], with pt's permission and left message for [employer]. CSW also encouraged pt to check with his Insurance account manager as it appears pt may have co-pays for certain medical services. CSW explained to pt that some co-pay assistance is available through [employer] for his diagnosis. CSW educated pt on how to apply for this assistance as well. Pt stated understanding. CSW also encouraged pt to apply for heating assistance and other possible resources at DSS. CSW will continue to look for additional sources of support, but local resources may have been exhausted.   Clinical Social Work interventions: Set designer and referral Reassess needs  Loren Racer, [place] Tuesdays 8:30-1pm Wednesdays 8:30-12pm  [phone]

## 2014-12-13 NOTE — Patient Instructions (Signed)
[facility] Discharge Instructions for Patients Receiving Chemotherapy  Today you received the following chemotherapy agents Avastin and Campostar. Follow up as scheduled. Please call for any questions or concerns.      If you develop nausea and vomiting, or diarrhea that is not controlled by your medication, call the clinic.  The clinic phone number is [phone]. Office hours are Monday-Friday 8:30am-5:00pm.  BELOW ARE SYMPTOMS THAT SHOULD BE REPORTED IMMEDIATELY:  *FEVER GREATER THAN 101.0 F  *CHILLS WITH OR WITHOUT FEVER  NAUSEA AND VOMITING THAT IS NOT CONTROLLED WITH YOUR NAUSEA MEDICATION  *UNUSUAL SHORTNESS OF BREATH  *UNUSUAL BRUISING OR BLEEDING  TENDERNESS IN MOUTH AND THROAT WITH OR WITHOUT PRESENCE OF ULCERS  *URINARY PROBLEMS  *BOWEL PROBLEMS  UNUSUAL RASH Items with * indicate a potential emergency and should be followed up as soon as possible. If you have an emergency after office hours please contact your primary care physician or go to the nearest emergency department.  Please call the clinic during office hours if you have any questions or concerns.   You may also contact the Patient Navigator at [phone] should you have any questions or need assistance in obtaining follow up care. _____________________________________________________________________ Have you asked about our STAR program?    STAR stands for Survivorship Training and Rehabilitation, and this is a nationally recognized cancer care program that focuses on survivorship and rehabilitation.  Cancer and cancer treatments may cause problems, such as, pain, making you feel tired and keeping you from doing the things that you need or want to do. Cancer rehabilitation can help. Our goal is to reduce these troubling effects and help you have the best quality of life possible.  You may receive a survey from a nurse that asks questions about your current state of health.  Based  on the survey results, all eligible patients will be referred to the [facility] program for an evaluation so we can better serve you! A frequently asked questions sheet is available upon request.

## 2014-12-13 NOTE — Addendum Note (Signed)
Addended by: Baird Cancer on: 12/13/2014 10:03 AM   Modules accepted: Orders

## 2014-12-13 NOTE — Progress Notes (Signed)
Patient tolerated treatment well.  Pt given flu shot today per [place] request.  To left deltoid.  Pt. Tolerated well.

## 2014-12-13 NOTE — Patient Instructions (Signed)
Cody Buchanan Discharge Instructions  RECOMMENDATIONS MADE BY THE CONSULTANT AND ANY TEST RESULTS WILL BE SENT TO YOUR REFERRING PHYSICIAN.  According to [facility], your MRI of brain is stable and therefore we will continue with chemotherapy as planned. We will continue chemotherapy every 2 weeks. Return to clinic in 4 weeks for follow-up appointment with chemotherapy given in between. Please call [place] with any questions or concerns.  Thank you for choosing [place] to provide your oncology and hematology care.  To afford each patient quality time with our providers, please arrive at least 15 minutes before your scheduled appointment time.  With your help, our goal is to use those 15 minutes to complete the necessary work-up to ensure our physicians have the information they need to help with your evaluation and healthcare recommendations.    Effective January 1st, 2014, we ask that you re-schedule your appointment with our physicians should you arrive 10 or more minutes late for your appointment.  We strive to give you quality time with our providers, and arriving late affects you and other patients whose appointments are after yours.    Again, thank you for choosing [facility].  Our hope is that these requests will decrease the amount of time that you wait before being seen by our physicians.       _____________________________________________________________  Should you have questions after your visit to [facility], please contact our office at [phone] between the hours of 8:30 a.m. and 5:00 p.m.  Voicemails left after 4:30 p.m. will not be returned until the following business day.  For prescription refill requests, have your pharmacy contact our office with your prescription refill request.

## 2014-12-19 ENCOUNTER — Encounter: Payer: Self-pay | Admitting: *Deleted

## 2014-12-19 ENCOUNTER — Other Ambulatory Visit (HOSPITAL_COMMUNITY): Payer: Self-pay | Admitting: Oncology

## 2014-12-19 DIAGNOSIS — C719 Malignant neoplasm of brain, unspecified: Secondary | ICD-10-CM

## 2014-12-19 MED ORDER — OXYCODONE HCL 5 MG PO CAPS: 5.0000 mg | ORAL_CAPSULE | ORAL | Status: DC | PRN

## 2014-12-21 NOTE — Progress Notes (Signed)
[facility] Clinical Social Work Late Entry Clinical Social Work was referred by CSW for assessment of psychosocial needs due to follow up on financial concerns.  Clinical Social Worker met with patient at [facility], briefly to offer support and assess for needs.  Pt reports Materials engineer did reach out to him and send him paperwork. No other needs currently. Pt very appreciative.   Clinical Social Work interventions: Pt advocacy  Loren Racer, [place] Tuesdays 8:30-1pm Wednesdays 8:30-12pm  [phone]

## 2014-12-27 ENCOUNTER — Encounter (HOSPITAL_BASED_OUTPATIENT_CLINIC_OR_DEPARTMENT_OTHER): Payer: Medicaid Other

## 2014-12-27 ENCOUNTER — Encounter (HOSPITAL_COMMUNITY): Payer: Self-pay

## 2014-12-27 VITALS — BP 140/84 | HR 74 | Temp 97.9°F | Resp 16 | Wt 172.4 lb

## 2014-12-27 DIAGNOSIS — C719 Malignant neoplasm of brain, unspecified: Secondary | ICD-10-CM

## 2014-12-27 DIAGNOSIS — R05 Cough: Secondary | ICD-10-CM

## 2014-12-27 DIAGNOSIS — Z5112 Encounter for antineoplastic immunotherapy: Secondary | ICD-10-CM

## 2014-12-27 DIAGNOSIS — R059 Cough, unspecified: Secondary | ICD-10-CM

## 2014-12-27 DIAGNOSIS — C711 Malignant neoplasm of frontal lobe: Secondary | ICD-10-CM

## 2014-12-27 DIAGNOSIS — Z5111 Encounter for antineoplastic chemotherapy: Secondary | ICD-10-CM

## 2014-12-27 LAB — CBC WITH DIFFERENTIAL/PLATELET
BASOS ABS: 0 10*3/uL (ref 0.0–0.1)
BASOS PCT: 1 % (ref 0–1)
Eosinophils Absolute: 0.1 10*3/uL (ref 0.0–0.7)
Eosinophils Relative: 1 % (ref 0–5)
HCT: 41.4 % (ref 39.0–52.0)
Hemoglobin: 13.7 g/dL (ref 13.0–17.0)
LYMPHS ABS: 1.2 10*3/uL (ref 0.7–4.0)
Lymphocytes Relative: 17 % (ref 12–46)
MCH: 29.7 pg (ref 26.0–34.0)
MCHC: 33.1 g/dL (ref 30.0–36.0)
MCV: 89.8 fL (ref 78.0–100.0)
Monocytes Absolute: 0.5 10*3/uL (ref 0.1–1.0)
Monocytes Relative: 7 % (ref 3–12)
Neutro Abs: 4.9 10*3/uL (ref 1.7–7.7)
Neutrophils Relative %: 74 % (ref 43–77)
Platelets: 233 10*3/uL (ref 150–400)
RBC: 4.61 MIL/uL (ref 4.22–5.81)
RDW: 14.9 % (ref 11.5–15.5)
WBC: 6.6 10*3/uL (ref 4.0–10.5)

## 2014-12-27 LAB — URINALYSIS, DIPSTICK ONLY
Bilirubin Urine: NEGATIVE
Glucose, UA: NEGATIVE mg/dL
Hgb urine dipstick: NEGATIVE
Ketones, ur: NEGATIVE mg/dL
Leukocytes, UA: NEGATIVE
Nitrite: NEGATIVE
PROTEIN: NEGATIVE mg/dL
SPECIFIC GRAVITY, URINE: 1.02 (ref 1.005–1.030)
UROBILINOGEN UA: 0.2 mg/dL (ref 0.0–1.0)
pH: 5.5 (ref 5.0–8.0)

## 2014-12-27 LAB — COMPREHENSIVE METABOLIC PANEL
ALBUMIN: 3.7 g/dL (ref 3.5–5.2)
ALK PHOS: 62 U/L (ref 39–117)
ALT: 17 U/L (ref 0–53)
AST: 18 U/L (ref 0–37)
Anion gap: 7 (ref 5–15)
BILIRUBIN TOTAL: 0.6 mg/dL (ref 0.3–1.2)
BUN: 9 mg/dL (ref 6–23)
CHLORIDE: 107 meq/L (ref 96–112)
CO2: 24 mmol/L (ref 19–32)
CREATININE: 0.93 mg/dL (ref 0.50–1.35)
Calcium: 8.8 mg/dL (ref 8.4–10.5)
GFR calc Af Amer: >90 mL/min (ref >90)
Glucose, Bld: 94 mg/dL (ref 70–99)
POTASSIUM: 3.7 mmol/L (ref 3.5–5.1)
Sodium: 138 mmol/L (ref 135–145)
Total Protein: 6.8 g/dL (ref 6.0–8.3)

## 2014-12-27 MED ORDER — HEPARIN SOD (PORK) LOCK FLUSH 100 UNIT/ML IV SOLN: 250.0000 [IU] | Freq: Once | INTRAVENOUS | Status: DC | PRN

## 2014-12-27 MED ORDER — DIPHENHYDRAMINE HCL 50 MG/ML IJ SOLN: 25.0000 mg | Freq: Once | INTRAMUSCULAR | Status: DC | PRN

## 2014-12-27 MED ORDER — SODIUM CHLORIDE 0.9 % IJ SOLN: 3.0000 mL | INTRAMUSCULAR | Status: DC | PRN

## 2014-12-27 MED ORDER — ALTEPLASE 2 MG IJ SOLR: 2.0000 mg | Freq: Once | INTRAMUSCULAR | Status: DC | PRN

## 2014-12-27 MED ORDER — EPINEPHRINE HCL 0.1 MG/ML IJ SOSY: 0.2500 mg | PREFILLED_SYRINGE | Freq: Once | INTRAMUSCULAR | Status: DC | PRN

## 2014-12-27 MED ORDER — HYDROCODONE-HOMATROPINE 5-1.5 MG/5ML PO SYRP
5.0000 mL | ORAL_SOLUTION | Freq: Once | ORAL | Status: AC
  Administered 2014-12-27: 5 mL via ORAL
  Filled 2014-12-27: qty 5

## 2014-12-27 MED ORDER — DEXAMETHASONE SODIUM PHOSPHATE 20 MG/5ML IJ SOLN: 12.0000 mg | Freq: Once | INTRAMUSCULAR | Status: DC

## 2014-12-27 MED ORDER — HEPARIN SOD (PORK) LOCK FLUSH 100 UNIT/ML IV SOLN
500.0000 [IU] | Freq: Once | INTRAVENOUS | Status: AC | PRN
  Administered 2014-12-27: 500 [IU]
  Filled 2014-12-27: qty 5

## 2014-12-27 MED ORDER — DIPHENHYDRAMINE HCL 50 MG/ML IJ SOLN: 50.0000 mg | Freq: Once | INTRAMUSCULAR | Status: DC | PRN

## 2014-12-27 MED ORDER — ALBUTEROL SULFATE (2.5 MG/3ML) 0.083% IN NEBU: 2.5000 mg | INHALATION_SOLUTION | Freq: Once | RESPIRATORY_TRACT | Status: DC | PRN

## 2014-12-27 MED ORDER — SODIUM CHLORIDE 0.9 % IJ SOLN: 10.0000 mL | INTRAMUSCULAR | Status: DC | PRN

## 2014-12-27 MED ORDER — SODIUM CHLORIDE 0.9 % IV SOLN
10.0000 mg/kg | Freq: Once | INTRAVENOUS | Status: AC
  Administered 2014-12-27: 775 mg via INTRAVENOUS
  Filled 2014-12-27: qty 31

## 2014-12-27 MED ORDER — DEXTROSE 5 % IV SOLN
125.0000 mg/m2 | Freq: Once | INTRAVENOUS | Status: AC
  Administered 2014-12-27: 244 mg via INTRAVENOUS
  Filled 2014-12-27: qty 12.2

## 2014-12-27 MED ORDER — COLD PACK MISC ONCOLOGY: 1.0000 | Freq: Once | Status: DC | PRN

## 2014-12-27 MED ORDER — SODIUM CHLORIDE 0.9 % IV SOLN
Freq: Once | INTRAVENOUS | Status: AC
  Administered 2014-12-27: 16 mg via INTRAVENOUS
  Filled 2014-12-27: qty 8

## 2014-12-27 MED ORDER — FAMOTIDINE IN NACL 20-0.9 MG/50ML-% IV SOLN: 20.0000 mg | Freq: Once | INTRAVENOUS | Status: DC | PRN

## 2014-12-27 MED ORDER — ATROPINE SULFATE 1 MG/ML IJ SOLN: 1.0000 mg | Freq: Once | INTRAMUSCULAR | Status: DC | PRN

## 2014-12-27 MED ORDER — SODIUM CHLORIDE 0.9 % IV SOLN: Freq: Once | INTRAVENOUS | Status: DC | PRN

## 2014-12-27 MED ORDER — SODIUM CHLORIDE 0.9 % IV SOLN
Freq: Once | INTRAVENOUS | Status: AC
  Administered 2014-12-27: 10:00:00 via INTRAVENOUS

## 2014-12-27 MED ORDER — SODIUM CHLORIDE 0.9 % IV SOLN: 16.0000 mg | Freq: Once | INTRAVENOUS | Status: DC

## 2014-12-27 MED ORDER — METHYLPREDNISOLONE SODIUM SUCC 125 MG IJ SOLR: 125.0000 mg | Freq: Once | INTRAMUSCULAR | Status: DC | PRN

## 2014-12-27 MED ORDER — EPINEPHRINE HCL 1 MG/ML IJ SOLN
0.5000 mg | Freq: Once | INTRAMUSCULAR | Status: DC | PRN
Start: 2014-12-27 — End: 2014-12-27

## 2014-12-27 MED ORDER — EPINEPHRINE HCL 1 MG/ML IJ SOLN: 0.5000 mg | Freq: Once | INTRAMUSCULAR | Status: DC | PRN

## 2014-12-27 NOTE — Progress Notes (Signed)
1240:  Pt reports NP cough and can be heard coughing at nurses station.  Requesting something to ease cough.  [place], Christian notified and orders received.

## 2014-12-27 NOTE — Patient Instructions (Signed)
[facility] Discharge Instructions for Patients Receiving Chemotherapy  Today you received the following chemotherapy agents:  Irinotecan and Avastin  If you develop nausea and vomiting, or diarrhea that is not controlled by your medication, call the clinic.  The clinic phone number is [phone]. Office hours are Monday-Friday 8:30am-5:00pm.  BELOW ARE SYMPTOMS THAT SHOULD BE REPORTED IMMEDIATELY:  *FEVER GREATER THAN 101.0 F  *CHILLS WITH OR WITHOUT FEVER  NAUSEA AND VOMITING THAT IS NOT CONTROLLED WITH YOUR NAUSEA MEDICATION  *UNUSUAL SHORTNESS OF BREATH  *UNUSUAL BRUISING OR BLEEDING  TENDERNESS IN MOUTH AND THROAT WITH OR WITHOUT PRESENCE OF ULCERS  *URINARY PROBLEMS  *BOWEL PROBLEMS  UNUSUAL RASH Items with * indicate a potential emergency and should be followed up as soon as possible. If you have an emergency after office hours please contact your primary care physician or go to the nearest emergency department.  Please call the clinic during office hours if you have any questions or concerns.   You may also contact the Patient Navigator at [phone] should you have any questions or need assistance in obtaining follow up care. _____________________________________________________________________ Have you asked about our STAR program?    STAR stands for Survivorship Training and Rehabilitation, and this is a nationally recognized cancer care program that focuses on survivorship and rehabilitation.  Cancer and cancer treatments may cause problems, such as, pain, making you feel tired and keeping you from doing the things that you need or want to do. Cancer rehabilitation can help. Our goal is to reduce these troubling effects and help you have the best quality of life possible.  You may receive a survey from a nurse that asks questions about your current state of health.  Based on the survey results, all eligible patients will be referred to the [facility]  program for an evaluation so we can better serve you! A frequently asked questions sheet is available upon request.

## 2014-12-27 NOTE — Progress Notes (Signed)
Judge Stall Tolerated chemotherapy well today, discharged ambulatory

## 2014-12-29 ENCOUNTER — Emergency Department (HOSPITAL_COMMUNITY)
Admission: EM | Admit: 2014-12-29 | Discharge: 2014-12-29 | Disposition: A | Discharge status: Home / Self Care | Payer: Medicaid Other | Attending: Emergency Medicine | Admitting: Emergency Medicine

## 2014-12-29 ENCOUNTER — Emergency Department (HOSPITAL_COMMUNITY): Payer: Medicaid Other

## 2014-12-29 ENCOUNTER — Encounter (HOSPITAL_COMMUNITY): Payer: Self-pay

## 2014-12-29 DIAGNOSIS — Z792 Long term (current) use of antibiotics: Secondary | ICD-10-CM | POA: Diagnosis not present

## 2014-12-29 DIAGNOSIS — R51 Headache: Secondary | ICD-10-CM | POA: Insufficient documentation

## 2014-12-29 DIAGNOSIS — Z79899 Other long term (current) drug therapy: Secondary | ICD-10-CM | POA: Insufficient documentation

## 2014-12-29 DIAGNOSIS — J3489 Other specified disorders of nose and nasal sinuses: Secondary | ICD-10-CM

## 2014-12-29 DIAGNOSIS — F419 Anxiety disorder, unspecified: Secondary | ICD-10-CM | POA: Diagnosis not present

## 2014-12-29 DIAGNOSIS — Z791 Long term (current) use of non-steroidal anti-inflammatories (NSAID): Secondary | ICD-10-CM | POA: Diagnosis not present

## 2014-12-29 DIAGNOSIS — D496 Neoplasm of unspecified behavior of brain: Secondary | ICD-10-CM | POA: Insufficient documentation

## 2014-12-29 DIAGNOSIS — Z8719 Personal history of other diseases of the digestive system: Secondary | ICD-10-CM | POA: Insufficient documentation

## 2014-12-29 DIAGNOSIS — Z720 Tobacco use: Secondary | ICD-10-CM | POA: Insufficient documentation

## 2014-12-29 MED ORDER — IOHEXOL 300 MG/ML  SOLN: 100.0000 mL | Freq: Once | INTRAMUSCULAR | Status: DC | PRN

## 2014-12-29 MED ORDER — NAPROXEN 500 MG PO TABS: 500.0000 mg | ORAL_TABLET | Freq: Two times a day (BID) | ORAL | Status: DC

## 2014-12-29 MED ORDER — IBUPROFEN 800 MG PO TABS
800.0000 mg | ORAL_TABLET | Freq: Once | ORAL | Status: AC
  Administered 2014-12-29: 800 mg via ORAL
  Filled 2014-12-29: qty 1

## 2014-12-29 MED ORDER — AMOXICILLIN 500 MG PO CAPS: 500.0000 mg | ORAL_CAPSULE | Freq: Three times a day (TID) | ORAL | Status: DC

## 2014-12-29 NOTE — ED Provider Notes (Signed)
CSN: [id number]     Arrival date & time 12/29/14  0827 History  This chart was scribed for Cody Essex, MD by Stephania Fragmin, ED Scribe. This patient was seen in room APA04/APA04 and the patient's care was started at 8:39 AM.    Chief Complaint  Patient presents with  . sinus pain, earache    HPI   HPI Comments: Cody Buchanan is a 59 y.o. male with a history of glioblastoma who presents to the Emergency Department complaining of constant, worsening, "ringing" right-sided sinus pressure and pain radiating to his right ears that woke him up from sleep about 6 hours ago; he was last well when he went to bed. He states he blew out a "mass of green snot" when he woke up. He endorses cough and nasal congestion. Patient has had left-sided sinus infection 3 times before that were treated with oxycodone and doxycycline, but he states that today's symptoms feels worse. He finished a course of doxy for a sinus infection 3-4 days ago. Patient has history of glioblastoma; he had an MRI done about 1 month ago which returned negative. He also had two surgeries done by Dr. Tommi Rumps at [facility], and receives biweekly chemotherapy by Robynn Pane, PA-C, here at [facility], with his last treatment done 2 days ago. Patient take oxycodone for the tumor, and anti-seizure medications. He denies a history of hypertension or DM. He also denies fever, sore throat, dizziness, lightheadedness, chest pain, and SOB. He has NKDA. Patient drove here today.    Past Medical History  Diagnosis Date  . Glioblastoma   . GERD (gastroesophageal reflux disease)   . Seizures   . Anxiety 09/07/2014   Past Surgical History  Procedure Laterality Date  . Salivary gland surgery    . Brain tumor excision     Family History  Problem Relation Age of Onset  . Cancer Mother   . Diabetes Mother   . Hypertension Mother   . Stroke Mother   . Diabetes Father    History  Substance Use Topics  . Smoking status: Current Every Day  Smoker    Types: Cigarettes  . Smokeless tobacco: Never Used  . Alcohol Use: No    Review of Systems  A complete 10 system review of systems was obtained and all systems are negative except as noted in the HPI and PMH.    Allergies  Review of patient's allergies indicates no known allergies.  Home Medications   Prior to Admission medications   Medication Sig Start Date End Date Taking? Authorizing Provider  albuterol (PROAIR HFA) 108 (90 BASE) MCG/ACT inhaler Inhale 1-2 puffs into the lungs every 6 (six) hours as needed for wheezing or shortness of breath.  02/22/14  Yes Historical Provider, MD  bisacodyl (DULCOLAX) 5 MG EC tablet Take 5 mg by mouth daily as needed for moderate constipation.   Yes Historical Provider, MD  diazepam (VALIUM) 10 MG tablet Take 1 tablet (10 mg total) by mouth daily. 12/11/14  Yes Farrel Gobble, MD  levETIRAcetam (KEPPRA) 1000 MG tablet Take 1,000 mg by mouth 2 (two) times daily. 02/22/14  Yes Historical Provider, MD  Multiple Vitamins-Minerals (MULTIVITAMIN WITH MINERALS) tablet Take 1 tablet by mouth daily.   Yes Historical Provider, MD  oxycodone (OXY-IR) 5 MG capsule Take 1-2 capsules (5-10 mg total) by mouth every 4 (four) hours as needed for pain. 12/19/14  Yes Baird Cancer, PA-C  acetaminophen (TYLENOL) 325 MG tablet Take 650 mg by mouth  every 6 (six) hours as needed for mild pain.  03/06/14   Historical Provider, MD  amoxicillin (AMOXIL) 500 MG capsule Take 1 capsule (500 mg total) by mouth 3 (three) times daily. 12/29/14   Cody Essex, MD  ibuprofen (ADVIL,MOTRIN) 200 MG tablet Take 400 mg by mouth every 6 (six) hours as needed for mild pain. Pain    Historical Provider, MD  lidocaine-prilocaine (EMLA) cream Apply a quarter size amount to port site 1 hour prior to chemo. Do not rub in. Cover with plastic wrap. 10/10/14   Farrel Gobble, MD  loperamide (IMODIUM) 2 MG capsule Take 2 mg by mouth as needed for diarrhea or loose stools. Take 2  tablets after first loose stool and then 1 tablet every 2 hours until you have gone 12 hours without a loose stool. If you develop loose stools during the night, take 2 tablets every 4 hours. Contact the [place] for uncontrolled loose stools. 10/10/14   Farrel Gobble, MD  naproxen (NAPROSYN) 500 MG tablet Take 1 tablet (500 mg total) by mouth 2 (two) times daily. 12/29/14   Cody Essex, MD  ondansetron (ZOFRAN-ODT) 8 MG disintegrating tablet Take 8 mg by mouth every 8 (eight) hours as needed for nausea or vomiting. 04/21/14   Farrel Gobble, MD   BP 155/95 mmHg  Pulse 85  Temp(Src) 98 F (36.7 C) (Oral)  Resp 18  Ht 5\' 9"  (1.753 m)  Wt 180 lb (81.647 kg)  BMI 26.57 kg/m2  SpO2 99% Physical Exam  Constitutional: He is oriented to person, place, and time. He appears well-developed and well-nourished. No distress.  HENT:  Head: Normocephalic and atraumatic.  Right Ear: Tympanic membrane normal.  Left Ear: Tympanic membrane normal.  Mouth/Throat: Posterior oropharyngeal erythema (mild) present. No oropharyngeal exudate.  Well healed craniotomy scar. Right frontal and maxillary tenderness. Right ear with small amount of wax. Both TMs normal. Throat has mild erythema.   9:47 PM- Right ear canal clear after irrigation. TM normal.  Eyes: Conjunctivae and EOM are normal. Pupils are equal, round, and reactive to light.  Neck: Normal range of motion. Neck supple.  No meningismus.  Cardiovascular: Normal rate, regular rhythm, normal heart sounds and intact distal pulses.   No murmur heard. Pulmonary/Chest: Effort normal and breath sounds normal. No respiratory distress.  Abdominal: Soft. There is no tenderness. There is no rebound and no guarding.  Musculoskeletal: Normal range of motion. He exhibits no edema or tenderness.  Neurological: He is alert and oriented to person, place, and time. No cranial nerve deficit. He exhibits normal muscle tone. Coordination normal.  No ataxia on  finger to nose bilaterally. No pronator drift. 5/5 strength throughout. CN 2-12 intact. Negative Romberg. Equal grip strength. Sensation intact. Gait is normal.   Skin: Skin is warm.  Psychiatric: He has a normal mood and affect. His behavior is normal.  Nursing note and vitals reviewed.   ED Course  Procedures (including critical care time)  DIAGNOSTIC STUDIES: Oxygen Saturation is 99% on room air, normal by my interpretation.    COORDINATION OF CARE: 8:49 AM - Discussed treatment plan with pt at bedside which includes Motrin and CT and pt agreed to plan.  9:48 AM- Ear irrigation and Motrin relieved ear pain down to a severity level of 7/10; discussed treatment plan which includes antibiotics and pain medication and pt agreed to plan.   Labs Review Labs Reviewed - No data to display  Imaging Review Ct Head Wo Contrast  12/29/2014  CLINICAL DATA:  6 hr history of Right-sided facial and ear pain along with frontal headaches. History of brain tumor with surgery and chemotherapy.  EXAM: CT HEAD WITHOUT CONTRAST  CT MAXILLOFACIAL WITHOUT CONTRAST  TECHNIQUE: Multidetector CT imaging of the head and maxillofacial structures were performed using the standard protocol without intravenous contrast. Multiplanar CT image reconstructions of the maxillofacial structures were also generated.  COMPARISON:  Brain MRI 04/19/2014 and head CT 06/15/2010  FINDINGS: CT HEAD FINDINGS  Surgical changes involving the left frontal lobe with encephalomalacia and craniotomy defects. No obvious mass or mass effect but study limited without IV contrast to assess for residual tumor. No CT findings for hemispheric infarction an or intracranial hemorrhage. The ventricles are in the midline without mass effect or shift. No extra-axial fluid collections are identified. The brainstem and cerebellum are grossly normal.  No acute bony findings. Surgical changes from frontal craniotomies. There is extensive paranasal sinus  disease. The mastoid air cells and middle ear cavities are clear.  CT MAXILLOFACIAL FINDINGS  No acute bony findings. There is extensive sinus disease with marked mucoperiosteal thickening and opacification involving the frontal, ethmoid, maxillary and sphenoid sinuses. The left maxillary sinus disease appears chronic with marked thickening of the sinus walls. The mastoid air cells and middle ear cavities are clear.  IMPRESSION: 1. Surgical changes from a frontal craniotomy and left frontal tumor resection with encephalomalacia. No obvious recurrent tumor but study is limited without IV contrast. No mass effect on the midline structures, extra-axial fluid collections or intracranial hemorrhage. 2. Extensive paranasal sinus disease.   Electronically Signed   By: Kalman Jewels M.D.   On: 12/29/2014 09:39   Ct Maxillofacial Wo Cm  12/29/2014   CLINICAL DATA:  6 hr history of Right-sided facial and ear pain along with frontal headaches. History of brain tumor with surgery and chemotherapy.  EXAM: CT HEAD WITHOUT CONTRAST  CT MAXILLOFACIAL WITHOUT CONTRAST  TECHNIQUE: Multidetector CT imaging of the head and maxillofacial structures were performed using the standard protocol without intravenous contrast. Multiplanar CT image reconstructions of the maxillofacial structures were also generated.  COMPARISON:  Brain MRI 04/19/2014 and head CT 06/15/2010  FINDINGS: CT HEAD FINDINGS  Surgical changes involving the left frontal lobe with encephalomalacia and craniotomy defects. No obvious mass or mass effect but study limited without IV contrast to assess for residual tumor. No CT findings for hemispheric infarction an or intracranial hemorrhage. The ventricles are in the midline without mass effect or shift. No extra-axial fluid collections are identified. The brainstem and cerebellum are grossly normal.  No acute bony findings. Surgical changes from frontal craniotomies. There is extensive paranasal sinus disease. The  mastoid air cells and middle ear cavities are clear.  CT MAXILLOFACIAL FINDINGS  No acute bony findings. There is extensive sinus disease with marked mucoperiosteal thickening and opacification involving the frontal, ethmoid, maxillary and sphenoid sinuses. The left maxillary sinus disease appears chronic with marked thickening of the sinus walls. The mastoid air cells and middle ear cavities are clear.  IMPRESSION: 1. Surgical changes from a frontal craniotomy and left frontal tumor resection with encephalomalacia. No obvious recurrent tumor but study is limited without IV contrast. No mass effect on the midline structures, extra-axial fluid collections or intracranial hemorrhage. 2. Extensive paranasal sinus disease.   Electronically Signed   By: Kalman Jewels M.D.   On: 12/29/2014 09:39     EKG Interpretation None      MDM   Final diagnoses:  Brain tumor  Sinus pain   Pressure to right ear, right face since this morning. History of frequent sinus infections usually on the left side. History of glioblastoma status post resection currently getting chemotherapy last treatment 2 days ago. No fever. No weakness, numbness or tingling. No dizziness. Nonfocal neuro exam.  CT shows stable postsurgical changes. Extensive sinus disease noted. Given patient's immunocompromised state, we'll treat with antibiotics. Symptoms somewhat improved after R ear irrigation.  Patient requesting pain medication. Narcotic database reviewed shows patient receives chronic pain medications from Dr. Barnet Glasgow and Dr. Emilee Hero.  He received 20 Percocet on December 22 and 100 oxycodone on December 24. Because of this, he will not receive further narcotics and is advised his usual prescribers need to refill his medications.  I personally performed the services described in this documentation, which was scribed in my presence. The recorded information has been reviewed and is accurate.     Cody Essex,  MD 12/29/14 [phone]

## 2014-12-29 NOTE — ED Notes (Signed)
Pt c/o pressure in r ear and r side of face.  Reports has frequent sinus infections.  Reports "ringing and pounding" in r ear since 3 am.  Pt presently getting treatment at [facility] for brain tumor.

## 2014-12-29 NOTE — Discharge Instructions (Signed)

## 2015-01-02 ENCOUNTER — Other Ambulatory Visit (HOSPITAL_COMMUNITY): Payer: Self-pay | Admitting: Oncology

## 2015-01-02 DIAGNOSIS — C719 Malignant neoplasm of brain, unspecified: Secondary | ICD-10-CM

## 2015-01-02 MED ORDER — OXYCODONE HCL 5 MG PO CAPS: 5.0000 mg | ORAL_CAPSULE | ORAL | Status: DC | PRN

## 2015-01-07 NOTE — Assessment & Plan Note (Addendum)
Recurrent glioblastoma multiforme left frontal lobe, status post Avastin on 09/27/2014 with the addition of Irinotecan on 10/11/2014 every 2 weeks. [place] visit on 12/06/2014 with MRI of brain demonstrates stability to disease and it is recommended to continue with chemotherapy as previously mentioned.  Pre-chemo labs as ordered.  Return in 2 weeks for follow-up.

## 2015-01-07 NOTE — Progress Notes (Signed)
-  Rescheduled-  Cody Buchanan  

## 2015-01-10 ENCOUNTER — Ambulatory Visit (HOSPITAL_COMMUNITY): Payer: Medicaid Other | Admitting: Oncology

## 2015-01-10 ENCOUNTER — Inpatient Hospital Stay (HOSPITAL_COMMUNITY): Payer: Medicaid Other

## 2015-01-11 ENCOUNTER — Encounter (HOSPITAL_BASED_OUTPATIENT_CLINIC_OR_DEPARTMENT_OTHER): Payer: Medicaid Other | Admitting: Oncology

## 2015-01-11 ENCOUNTER — Encounter (HOSPITAL_COMMUNITY): Payer: Medicaid Other | Attending: Hematology and Oncology

## 2015-01-11 ENCOUNTER — Encounter (HOSPITAL_COMMUNITY): Payer: Self-pay | Admitting: Oncology

## 2015-01-11 VITALS — BP 130/91 | HR 77 | Temp 97.9°F | Resp 20

## 2015-01-11 VITALS — BP 138/97 | HR 90 | Temp 97.4°F | Resp 18 | Wt 177.4 lb

## 2015-01-11 DIAGNOSIS — C711 Malignant neoplasm of frontal lobe: Secondary | ICD-10-CM

## 2015-01-11 DIAGNOSIS — Z5112 Encounter for antineoplastic immunotherapy: Secondary | ICD-10-CM

## 2015-01-11 DIAGNOSIS — Z5111 Encounter for antineoplastic chemotherapy: Secondary | ICD-10-CM

## 2015-01-11 DIAGNOSIS — C719 Malignant neoplasm of brain, unspecified: Secondary | ICD-10-CM

## 2015-01-11 DIAGNOSIS — D496 Neoplasm of unspecified behavior of brain: Secondary | ICD-10-CM

## 2015-01-11 LAB — COMPREHENSIVE METABOLIC PANEL
ALBUMIN: 3.7 g/dL (ref 3.5–5.2)
ALT: 22 U/L (ref 0–53)
ANION GAP: 7 (ref 5–15)
AST: 22 U/L (ref 0–37)
Alkaline Phosphatase: 65 U/L (ref 39–117)
BUN: 14 mg/dL (ref 6–23)
CALCIUM: 8.8 mg/dL (ref 8.4–10.5)
CO2: 25 mmol/L (ref 19–32)
CREATININE: 1.1 mg/dL (ref 0.50–1.35)
Chloride: 107 mEq/L (ref 96–112)
GFR calc Af Amer: 84 mL/min — ABNORMAL LOW (ref >90)
GFR, EST NON AFRICAN AMERICAN: 73 mL/min — AB (ref >90)
GLUCOSE: 86 mg/dL (ref 70–99)
Potassium: 3.9 mmol/L (ref 3.5–5.1)
SODIUM: 139 mmol/L (ref 135–145)
TOTAL PROTEIN: 7.1 g/dL (ref 6.0–8.3)
Total Bilirubin: 0.4 mg/dL (ref 0.3–1.2)

## 2015-01-11 LAB — CBC WITH DIFFERENTIAL/PLATELET
BASOS ABS: 0.1 10*3/uL (ref 0.0–0.1)
Basophils Relative: 1 % (ref 0–1)
EOS ABS: 0.1 10*3/uL (ref 0.0–0.7)
Eosinophils Relative: 1 % (ref 0–5)
HCT: 43.1 % (ref 39.0–52.0)
Hemoglobin: 14.1 g/dL (ref 13.0–17.0)
LYMPHS PCT: 20 % (ref 12–46)
Lymphs Abs: 1.4 10*3/uL (ref 0.7–4.0)
MCH: 29.2 pg (ref 26.0–34.0)
MCHC: 32.7 g/dL (ref 30.0–36.0)
MCV: 89.2 fL (ref 78.0–100.0)
Monocytes Absolute: 0.5 10*3/uL (ref 0.1–1.0)
Monocytes Relative: 7 % (ref 3–12)
Neutro Abs: 5 10*3/uL (ref 1.7–7.7)
Neutrophils Relative %: 71 % (ref 43–77)
Platelets: 267 10*3/uL (ref 150–400)
RBC: 4.83 MIL/uL (ref 4.22–5.81)
RDW: 14.2 % (ref 11.5–15.5)
WBC: 7.1 10*3/uL (ref 4.0–10.5)

## 2015-01-11 LAB — URINALYSIS, DIPSTICK ONLY
Bilirubin Urine: NEGATIVE
Glucose, UA: NEGATIVE mg/dL
HGB URINE DIPSTICK: NEGATIVE
Ketones, ur: NEGATIVE mg/dL
Leukocytes, UA: NEGATIVE
NITRITE: NEGATIVE
Protein, ur: NEGATIVE mg/dL
Specific Gravity, Urine: 1.02 (ref 1.005–1.030)
Urobilinogen, UA: 0.2 mg/dL (ref 0.0–1.0)
pH: 6 (ref 5.0–8.0)

## 2015-01-11 MED ORDER — IRINOTECAN HCL CHEMO INJECTION 100 MG/5ML
125.0000 mg/m2 | Freq: Once | INTRAVENOUS | Status: AC
  Administered 2015-01-11: 244 mg via INTRAVENOUS
  Filled 2015-01-11: qty 12.2

## 2015-01-11 MED ORDER — SODIUM CHLORIDE 0.9 % IV SOLN
Freq: Once | INTRAVENOUS | Status: AC
  Administered 2015-01-11: 16 mg via INTRAVENOUS
  Filled 2015-01-11: qty 8

## 2015-01-11 MED ORDER — HEPARIN SOD (PORK) LOCK FLUSH 100 UNIT/ML IV SOLN
500.0000 [IU] | Freq: Once | INTRAVENOUS | Status: AC | PRN
  Administered 2015-01-11: 500 [IU]

## 2015-01-11 MED ORDER — DEXAMETHASONE SODIUM PHOSPHATE 20 MG/5ML IJ SOLN: 12.0000 mg | Freq: Once | INTRAMUSCULAR | Status: DC

## 2015-01-11 MED ORDER — ATROPINE SULFATE 1 MG/ML IJ SOLN: 1.0000 mg | Freq: Once | INTRAMUSCULAR | Status: DC | PRN

## 2015-01-11 MED ORDER — SODIUM CHLORIDE 0.9 % IV SOLN
Freq: Once | INTRAVENOUS | Status: AC
  Administered 2015-01-11: 12:00:00 via INTRAVENOUS

## 2015-01-11 MED ORDER — HEPARIN SOD (PORK) LOCK FLUSH 100 UNIT/ML IV SOLN
INTRAVENOUS | Status: AC
  Filled 2015-01-11: qty 5

## 2015-01-11 MED ORDER — SODIUM CHLORIDE 0.9 % IJ SOLN: 10.0000 mL | INTRAMUSCULAR | Status: DC | PRN

## 2015-01-11 MED ORDER — SODIUM CHLORIDE 0.9 % IV SOLN: 16.0000 mg | Freq: Once | INTRAVENOUS | Status: DC

## 2015-01-11 MED ORDER — SODIUM CHLORIDE 0.9 % IV SOLN
10.0000 mg/kg | Freq: Once | INTRAVENOUS | Status: AC
  Administered 2015-01-11: 775 mg via INTRAVENOUS
  Filled 2015-01-11: qty 31

## 2015-01-11 NOTE — Patient Instructions (Signed)
[place] at [facility]  Discharge Instructions:  Return in 2 weeks for follow-up and treatment. Continue follow-up as directed at [facility]. _______________________________________________________________  Thank you for choosing [place] at [facility] to provide your oncology and hematology care.  To afford each patient quality time with our providers, please arrive at least 15 minutes before your scheduled appointment.  You need to re-schedule your appointment if you arrive 10 or more minutes late.  We strive to give you quality time with our providers, and arriving late affects you and other patients whose appointments are after yours.  Also, if you no show three or more times for appointments you may be dismissed from the clinic.  Again, thank you for choosing [place] at [place] hope is that these requests will allow you access to exceptional care and in a timely manner. _______________________________________________________________  If you have questions after your visit, please contact our office at [phone] between the hours of 8:30 a.m. and 5:00 p.m. Voicemails left after 4:30 p.m. will not be returned until the following business day. _______________________________________________________________  For prescription refill requests, have your pharmacy contact our office. _______________________________________________________________  Recommendations made by the consultant and any test results will be sent to your referring physician. _______________________________________________________________

## 2015-01-11 NOTE — Assessment & Plan Note (Signed)
Recurrent glioblastoma multiforme left frontal lobe, status post Avastin on 09/27/2014 with the addition of Irinotecan on 10/11/2014 every 2 weeks. [place] visit on 12/06/2014 with MRI of brain demonstrates stability to disease and it is recommended to continue with chemotherapy as previously mentioned.  Pre-chemo labs as ordered.  Return in 2 weeks for follow-up.

## 2015-01-11 NOTE — Progress Notes (Signed)
Cody Buchanan Tolerated chemotherapy well today, discharged ambulatory

## 2015-01-11 NOTE — Progress Notes (Signed)
Cody Hampshire, MD Cody Buchanan [id number]  Malignant brain tumor, left frontal glioblastoma multiforme grade 4  CURRENT THERAPY: Irinotecan/Avastin every 2 weeks. Previously treated with Temodar for 5 days every 28 days for 2 cycles plus leukocyte vaccine for 2 doses at [facility] per REGULATEe trial with repeat MRI on 08/23/2014 showing progression of disease. He was started on Avastin 10 mg per kilogram on 09/27/2014 with plans to continue Avastin every 2 weeks together with irinotecan 125 mg per meter squared every 2 weeks.  INTERVAL HISTORY: Cody Buchanan 59 y.o. male returns for followup of recurrent glioblastoma multiforme.   Oncology History   03/03/2014 Partial resection, Cody Buchanan, Cody Buchanan, Dr. Rene Paci Buchanan 04/07/2014 Re-resection at [place], Dr. Yetta Buchanan     Malignant brain tumor, left frontal glioblastoma multiforme grade 4   03/03/2014 Initial Diagnosis Malignant brain tumor, left frontal glioblastoma multiforme grade 4, [place], [place]. partial resection, Dr. Rene Paci Buchanan   04/07/2014 Surgery Re-resection at [place], Dr. Derrill Buchanan   05/10/2014 - 06/21/2014 Radiation Therapy By Dr. Sondra Buchanan with 6000 cGy in 30 fractions to left frontal brain   07/07/2014 -  Chemotherapy Temodar x5 days every 4 weeks for 2 cycles +2 infusions of patient's own lymphocytes.   08/23/2014 Progression MRI showed progression at [facility]   09/27/2014 -  Chemotherapy Avastin started with irinotecan added on 10/11/2014    12/06/2014 Imaging MRI brain at [facility]- Decreased enhancement, edema, and mass effect. This may represent pseudoresponse related to interval chemotherapy versus true response.    I personally reviewed and went over laboratory results with the patient.  The results are noted within this dictation.  I personally reviewed and went over radiographic studies with the patient.  The results are noted within this dictation.    Chart  reviewed.  ED visit noted on 12/29/2014 for sinus pain and earache.  He recently had a "boil" lanced by Cody Buchanan earlier this week.  He is on Doxycycline for that he notes.  He reports it is much better and improved.  Additionally, he reports that his sinus infection is improved.  History is difficult to ascertain but I think Cody Buchanan, primary care provider, treated him for a URI.  He notes that he needs a refill on Valium, but the last prescription from Cody Buchanan was written in December 2015 with 2 refills.  He should have 2 refills left on the Rx.   The patient notes he is doing well other than personal stress at home.  He and his mother care for his father who mobility and health is declining.    Oncologically, he denies any complaints and ROS questioning is negative.  Past Medical History  Diagnosis Date  . Glioblastoma   . GERD (gastroesophageal reflux disease)   . Seizures   . Anxiety 09/07/2014    has Malignant brain tumor, left frontal glioblastoma multiforme grade 4; Chronic obstructive pulmonary disease; Seizure disorder; Allergic rhinitis; Acute gouty arthritis; and Anxiety on his problem list.     has No Known Allergies.  Mr. Willemsen does not currently have medications on file.  Past Surgical History  Procedure Laterality Date  . Salivary gland surgery    . Brain tumor excision      Denies any headaches, dizziness, double vision, fevers, chills, night sweats, nausea, vomiting, diarrhea, constipation, chest pain, heart palpitations, shortness of breath, blood in stool, black tarry stool, urinary pain, urinary burning, urinary  frequency, hematuria.   PHYSICAL EXAMINATION  ECOG PERFORMANCE STATUS: 0 - Asymptomatic  Filed Vitals:   01/11/15 1000  BP: 138/97  Pulse: 90  Temp: 97.4 F (36.3 C)  Resp: 18    GENERAL:alert, no distress, well nourished, well developed, comfortable, cooperative and smiling SKIN: skin color, texture, turgor are normal, no rashes or  significant lesions HEAD: Normocephalic, No masses, lesions, tenderness or abnormalities EYES: normal, PERRLA, EOMI, Conjunctiva are pink and non-injected EARS: External ears normal OROPHARYNX:lips, buccal mucosa, and tongue normal and mucous membranes are moist  NECK: supple, no adenopathy, thyroid normal size, non-tender, without nodularity, trachea midline LYMPH:  no palpable lymphadenopathy BREAST:not examined LUNGS: clear to auscultation  HEART: regular rate & rhythm ABDOMEN:abdomen soft and normal bowel sounds BACK: Back symmetric, no curvature. EXTREMITIES:less then 2 second capillary refill, no joint deformities, effusion, or inflammation, no skin discoloration, no clubbing  NEURO: alert & oriented x 3 with fluent speech, no focal motor/sensory deficits, gait normal   LABORATORY DATA: CBC    Component Value Date/Time   WBC 7.1 01/11/2015 0953   RBC 4.83 01/11/2015 0953   HGB 14.1 01/11/2015 0953   HCT 43.1 01/11/2015 0953   PLT 267 01/11/2015 0953   MCV 89.2 01/11/2015 0953   MCH 29.2 01/11/2015 0953   MCHC 32.7 01/11/2015 0953   RDW 14.2 01/11/2015 0953   LYMPHSABS 1.4 01/11/2015 0953   MONOABS 0.5 01/11/2015 0953   EOSABS 0.1 01/11/2015 0953   BASOSABS 0.1 01/11/2015 0953      Chemistry      Component Value Date/Time   NA 139 01/11/2015 0953   K 3.9 01/11/2015 0953   CL 107 01/11/2015 0953   CO2 25 01/11/2015 0953   BUN 14 01/11/2015 0953   CREATININE 1.10 01/11/2015 0953      Component Value Date/Time   CALCIUM 8.8 01/11/2015 0953   ALKPHOS 65 01/11/2015 0953   AST 22 01/11/2015 0953   ALT 22 01/11/2015 0953   BILITOT 0.4 01/11/2015 0953       RADIOGRAPHIC STUDIES:  Ct Head Wo Contrast  12/29/2014   CLINICAL DATA:  6 hr history of Right-sided facial and ear pain along with frontal headaches. History of brain tumor with surgery and chemotherapy.  EXAM: CT HEAD WITHOUT CONTRAST  CT MAXILLOFACIAL WITHOUT CONTRAST  TECHNIQUE: Multidetector CT imaging of  the head and maxillofacial structures were performed using the standard protocol without intravenous contrast. Multiplanar CT image reconstructions of the maxillofacial structures were also generated.  COMPARISON:  Brain MRI 04/19/2014 and head CT 06/15/2010  FINDINGS: CT HEAD FINDINGS  Surgical changes involving the left frontal lobe with encephalomalacia and craniotomy defects. No obvious mass or mass effect but study limited without IV contrast to assess for residual tumor. No CT findings for hemispheric infarction an or intracranial hemorrhage. The ventricles are in the midline without mass effect or shift. No extra-axial fluid collections are identified. The brainstem and cerebellum are grossly normal.  No acute bony findings. Surgical changes from frontal craniotomies. There is extensive paranasal sinus disease. The mastoid air cells and middle ear cavities are clear.  CT MAXILLOFACIAL FINDINGS  No acute bony findings. There is extensive sinus disease with marked mucoperiosteal thickening and opacification involving the frontal, ethmoid, maxillary and sphenoid sinuses. The left maxillary sinus disease appears chronic with marked thickening of the sinus walls. The mastoid air cells and middle ear cavities are clear.  IMPRESSION: 1. Surgical changes from a frontal craniotomy and left frontal tumor resection  with encephalomalacia. No obvious recurrent tumor but study is limited without IV contrast. No mass effect on the midline structures, extra-axial fluid collections or intracranial hemorrhage. 2. Extensive paranasal sinus disease.   Electronically Signed   By: Cody Buchanan M.D.   On: 12/29/2014 09:39   Ct Maxillofacial Wo Cm  12/29/2014   CLINICAL DATA:  6 hr history of Right-sided facial and ear pain along with frontal headaches. History of brain tumor with surgery and chemotherapy.  EXAM: CT HEAD WITHOUT CONTRAST  CT MAXILLOFACIAL WITHOUT CONTRAST  TECHNIQUE: Multidetector CT imaging of the head and  maxillofacial structures were performed using the standard protocol without intravenous contrast. Multiplanar CT image reconstructions of the maxillofacial structures were also generated.  COMPARISON:  Brain MRI 04/19/2014 and head CT 06/15/2010  FINDINGS: CT HEAD FINDINGS  Surgical changes involving the left frontal lobe with encephalomalacia and craniotomy defects. No obvious mass or mass effect but study limited without IV contrast to assess for residual tumor. No CT findings for hemispheric infarction an or intracranial hemorrhage. The ventricles are in the midline without mass effect or shift. No extra-axial fluid collections are identified. The brainstem and cerebellum are grossly normal.  No acute bony findings. Surgical changes from frontal craniotomies. There is extensive paranasal sinus disease. The mastoid air cells and middle ear cavities are clear.  CT MAXILLOFACIAL FINDINGS  No acute bony findings. There is extensive sinus disease with marked mucoperiosteal thickening and opacification involving the frontal, ethmoid, maxillary and sphenoid sinuses. The left maxillary sinus disease appears chronic with marked thickening of the sinus walls. The mastoid air cells and middle ear cavities are clear.  IMPRESSION: 1. Surgical changes from a frontal craniotomy and left frontal tumor resection with encephalomalacia. No obvious recurrent tumor but study is limited without IV contrast. No mass effect on the midline structures, extra-axial fluid collections or intracranial hemorrhage. 2. Extensive paranasal sinus disease.   Electronically Signed   By: Cody Buchanan M.D.   On: 12/29/2014 09:39   12/06/2014  ** MRI BRAIN WITHOUT AND WITH CONTRAST **  INDICATION: C71.1 Malignant neoplasm of frontal lobe, Evaluate for tumor progression provided. History of glioblastoma multiforme, now on Avastin and Irinotecan.  COMPARISON: 09/27/2014  TECHNIQUE/PROTOCOL: Standard adult brain protocol.  CONTRAST: 8mL  MultiHance IV. This MRI was performed before and after IV administration of contrast material. IV contrast was administered to improve disease detection and further define anatomy. *    GFR: Greater than 60 *    Complications:  No immediate patient complications or events noted.  FINDINGS: There is considerably less enhancement than on the prior study. Previously, the area of enhancement in the left frontal lobe mass measured approximately 4.3 x 4.2 cm, currently it measures 2.2 x 0.5 cm. The amount of T2 hyperintensity and mass effect has also dramatically decreased compared to prior. A predominantly nonenhancing T2 hyperintense mass in the left frontal lobe measures approximately 3.6 x 2.6 cm, previously 3.8 x 3.1 cm. There is some difference in angulation between the 2 studies, and therefore it is not definitely changed in size. No hydrocephalus. There is considerably less mass effect on the ventricles.  Prior left frontal craniotomy. No extra-axial collection. Partial opacification of the ethmoid air cells. Mild mucosal thickening in the left frontal sinus as well as the maxillary sinuses bilaterally. There is polypoid mucosal thickening extending into the left nasal cavity, likely a small antrochoanal polyp.   IMPRESSION: 1. Decreased enhancement, edema, and mass effect. This may represent pseudoresponse related  to interval chemotherapy versus true response. 2. Small antrochoanal polyp in the left nasal cavity.     Electronically Reviewed by:  Cody Frederick, MD Electronically Reviewed on:  12/06/2014 1:14 PM  I have reviewed the images and concur with the above findings.  Electronically Signed by:  Cody Dross, MD Electronically Signed on:  12/06/2014 4:54 PM     ASSESSMENT AND PLAN:  Malignant brain tumor, left frontal glioblastoma multiforme grade 4 Recurrent glioblastoma multiforme left frontal lobe, status post Avastin on 09/27/2014 with the addition of  Irinotecan on 10/11/2014 every 2 weeks. [facility] visit on 12/06/2014 with MRI of brain demonstrates stability to disease and it is recommended to continue with chemotherapy as previously mentioned.  Pre-chemo labs as ordered.  Return in 2 weeks for follow-up.      THERAPY PLAN:  We will continue with therapy as planned. He will follow-up at [facility] on 02/07/2015.  All questions were answered. The patient knows to call the clinic with any problems, questions or concerns. We can certainly see the patient much sooner if necessary.  Patient and plan discussed with Dr. Ancil Linsey and she is in agreement with the aforementioned.   Bexley Mclester 01/11/2015

## 2015-01-11 NOTE — Patient Instructions (Signed)
[facility] Discharge Instructions for Patients Receiving Chemotherapy  Today you received the following chemotherapy agents avastin, irinotecan  To help prevent nausea and vomiting after your treatment, we encourage you to take your nausea medication If you develop nausea and vomiting that is not controlled by your nausea medication, call the clinic. If it is after clinic hours your family physician or the after hours number for the clinic or go to the Emergency Department.   BELOW ARE SYMPTOMS THAT SHOULD BE REPORTED IMMEDIATELY:  *FEVER GREATER THAN 101.0 F  *CHILLS WITH OR WITHOUT FEVER  NAUSEA AND VOMITING THAT IS NOT CONTROLLED WITH YOUR NAUSEA MEDICATION  *UNUSUAL SHORTNESS OF BREATH  *UNUSUAL BRUISING OR BLEEDING  TENDERNESS IN MOUTH AND THROAT WITH OR WITHOUT PRESENCE OF ULCERS  *URINARY PROBLEMS  *BOWEL PROBLEMS  UNUSUAL RASH Items with * indicate a potential emergency and should be followed up as soon as possible.  One of the nurses will contact you 24 hours after your treatment. Please let the nurse know about any problems that you may have experienced. Feel free to call the clinic you have any questions or concerns. The clinic phone number is [phone].   I have been informed and understand all the instructions given to me. I know to contact the clinic, my physician, or go to the Emergency Department if any problems should occur. I do not have any questions at this time, but understand that I may call the clinic during office hours or the Patient Navigator at [phone] should I have any questions or need assistance in obtaining follow up care.  Bevacizumab injection What is this medicine? BEVACIZUMAB (be va SIZ yoo mab) is a chemotherapy drug. It targets a protein found in many cancer cell types, and halts cancer growth. This drug treats many cancers including non-small cell lung cancer, ovarian cancer, cervical cancer, and colon or rectal  cancer. It is usually given with other chemotherapy drugs. This medicine may be used for other purposes; ask your health care provider or pharmacist if you have questions. COMMON BRAND NAME(S): Avastin What should I tell my health care provider before I take this medicine? They need to know if you have any of these conditions: -blood clots -heart disease, including heart failure, heart attack, or chest pain (angina) -high blood pressure -infection (especially a virus infection such as chickenpox, cold sores, or herpes) -kidney disease -lung disease -prior chemotherapy with doxorubicin, daunorubicin, epirubicin, or other anthracycline type chemotherapy agents -recent or ongoing radiation therapy -recent surgery -stroke -an unusual or allergic reaction to bevacizumab, hamster proteins, mouse proteins, other medicines, foods, dyes, or preservatives -pregnant or trying to get pregnant -breast-feeding How should I use this medicine? This medicine is for infusion into a vein. It is given by a health care professional in a hospital or clinic setting. Talk to your pediatrician regarding the use of this medicine in children. Special care may be needed. Overdosage: If you think you have taken too much of this medicine contact a poison control center or emergency room at once. NOTE: This medicine is only for you. Do not share this medicine with others. What if I miss a dose? It is important not to miss your dose. Call your doctor or health care professional if you are unable to keep an appointment. What may interact with this medicine? Interactions are not expected. This list may not describe all possible interactions. Give your health care provider a list of all the medicines, herbs, non-prescription  drugs, or dietary supplements you use. Also tell them if you smoke, drink alcohol, or use illegal drugs. Some items may interact with your medicine. What should I watch for while using this  medicine? Your condition will be monitored carefully while you are receiving this medicine. You will need important blood work and urine testing done while you are taking this medicine. During your treatment, let your health care professional know if you have any unusual symptoms, such as difficulty breathing. This medicine may rarely cause 'gastrointestinal perforation' (holes in the stomach, intestines or colon), a serious side effect requiring surgery to repair. This medicine should be started at least 28 days following major surgery and the site of the surgery should be totally healed. Check with your doctor before scheduling dental work or surgery while you are receiving this treatment. Talk to your doctor if you have recently had surgery or if you have a wound that has not healed. Do not become pregnant while taking this medicine. Women should inform their doctor if they wish to become pregnant or think they might be pregnant. There is a potential for serious side effects to an unborn child. Talk to your health care professional or pharmacist for more information. Do not breast-feed an infant while taking this medicine. This medicine has caused ovarian failure in some women. This medicine may interfere with the ability to have a child. You should talk to your doctor or health care professional if you are concerned about your fertility. What side effects may I notice from receiving this medicine? Side effects that you should report to your doctor or health care professional as soon as possible: -allergic reactions like skin rash, itching or hives, swelling of the face, lips, or tongue -signs of infection - fever or chills, cough, sore throat, pain or trouble passing urine -signs of decreased platelets or bleeding - bruising, pinpoint red spots on the skin, black, tarry stools, nosebleeds, blood in the urine -breathing problems -changes in vision -chest pain -confusion -jaw pain, especially after  dental work -mouth sores -seizures -severe abdominal pain -severe headache -sudden numbness or weakness of the face, arm or leg -swelling of legs or ankles -symptoms of a stroke: change in mental awareness, inability to talk or move one side of the body (especially in patients with lung cancer) -trouble passing urine or change in the amount of urine -trouble speaking or understanding -trouble walking, dizziness, loss of balance or coordination Side effects that usually do not require medical attention (report to your doctor or health care professional if they continue or are bothersome): -constipation -diarrhea -dry skin -headache -loss of appetite -nausea, vomiting This list may not describe all possible side effects. Call your doctor for medical advice about side effects. You may report side effects to FDA at 1-800-FDA-1088. Where should I keep my medicine? This drug is given in a hospital or clinic and will not be stored at home. NOTE: This sheet is a summary. It may not cover all possible information. If you have questions about this medicine, talk to your doctor, pharmacist, or health care provider.  2015, Elsevier/Gold Standard. (2013-11-15 11:38:34)   Irinotecan injection What is this medicine? IRINOTECAN (ir in oh TEE kan ) is a chemotherapy drug. It is used to treat colon and rectal cancer. This medicine may be used for other purposes; ask your health care provider or pharmacist if you have questions. COMMON BRAND NAME(S): Camptosar What should I tell my health care provider before I take this medicine?  They need to know if you have any of these conditions: -blood disorders -dehydration -diarrhea -infection (especially a virus infection such as chickenpox, cold sores, or herpes) -liver disease -low blood counts, like low white cell, platelet, or red cell counts -recent or ongoing radiation therapy -an unusual or allergic reaction to irinotecan, sorbitol, other  chemotherapy, other medicines, foods, dyes, or preservatives -pregnant or trying to get pregnant -breast-feeding How should I use this medicine? This drug is given as an infusion into a vein. It is administered in a hospital or clinic by a specially trained health care professional. Talk to your pediatrician regarding the use of this medicine in children. Special care may be needed. Overdosage: If you think you have taken too much of this medicine contact a poison control center or emergency room at once. NOTE: This medicine is only for you. Do not share this medicine with others. What if I miss a dose? It is important not to miss your dose. Call your doctor or health care professional if you are unable to keep an appointment. What may interact with this medicine? Do not take this medicine with any of the following medications: -atazanavir -certain medicines for fungal infections like itraconazole and ketoconazole -St. John's Wort This medicine may also interact with the following medications: -dexamethasone -diuretics -laxatives -medicines for seizures like carbamazepine, mephobarbital, phenobarbital, phenytoin, primidone -medicines to increase blood counts like filgrastim, pegfilgrastim, sargramostim -prochlorperazine -vaccines This list may not describe all possible interactions. Give your health care provider a list of all the medicines, herbs, non-prescription drugs, or dietary supplements you use. Also tell them if you smoke, drink alcohol, or use illegal drugs. Some items may interact with your medicine. What should I watch for while using this medicine? Your condition will be monitored carefully while you are receiving this medicine. You will need important blood work done while you are taking this medicine. This drug may make you feel generally unwell. This is not uncommon, as chemotherapy can affect healthy cells as well as cancer cells. Report any side effects. Continue your  course of treatment even though you feel ill unless your doctor tells you to stop. In some cases, you may be given additional medicines to help with side effects. Follow all directions for their use. You may get drowsy or dizzy. Do not drive, use machinery, or do anything that needs mental alertness until you know how this medicine affects you. Do not stand or sit up quickly, especially if you are an older patient. This reduces the risk of dizzy or fainting spells. Call your doctor or health care professional for advice if you get a fever, chills or sore throat, or other symptoms of a cold or flu. Do not treat yourself. This drug decreases your body's ability to fight infections. Try to avoid being around people who are sick. This medicine may increase your risk to bruise or bleed. Call your doctor or health care professional if you notice any unusual bleeding. Be careful brushing and flossing your teeth or using a toothpick because you may get an infection or bleed more easily. If you have any dental work done, tell your dentist you are receiving this medicine. Avoid taking products that contain aspirin, acetaminophen, ibuprofen, naproxen, or ketoprofen unless instructed by your doctor. These medicines may hide a fever. Do not become pregnant while taking this medicine. Women should inform their doctor if they wish to become pregnant or think they might be pregnant. There is a potential for serious side  effects to an unborn child. Talk to your health care professional or pharmacist for more information. Do not breast-feed an infant while taking this medicine. What side effects may I notice from receiving this medicine? Side effects that you should report to your doctor or health care professional as soon as possible: -allergic reactions like skin rash, itching or hives, swelling of the face, lips, or tongue -low blood counts - this medicine may decrease the number of white blood cells, red blood cells  and platelets. You may be at increased risk for infections and bleeding. -signs of infection - fever or chills, cough, sore throat, pain or difficulty passing urine -signs of decreased platelets or bleeding - bruising, pinpoint red spots on the skin, black, tarry stools, blood in the urine -signs of decreased red blood cells - unusually weak or tired, fainting spells, lightheadedness -breathing problems -chest pain -diarrhea -feeling faint or lightheaded, falls -flushing, runny nose, sweating during infusion -mouth sores or pain -pain, swelling, redness or irritation where injected -pain, swelling, warmth in the leg -pain, tingling, numbness in the hands or feet -problems with balance, talking, walking -stomach cramps, pain -trouble passing urine or change in the amount of urine -vomiting as to be unable to hold down drinks or food -yellowing of the eyes or skin Side effects that usually do not require medical attention (report to your doctor or health care professional if they continue or are bothersome): -constipation -hair loss -headache -loss of appetite -nausea, vomiting -stomach upset This list may not describe all possible side effects. Call your doctor for medical advice about side effects. You may report side effects to FDA at 1-800-FDA-1088. Where should I keep my medicine? This drug is given in a hospital or clinic and will not be stored at home. NOTE: This sheet is a summary. It may not cover all possible information. If you have questions about this medicine, talk to your doctor, pharmacist, or health care provider.  2015, Elsevier/Gold Standard. (2013-06-13 16:29:32)

## 2015-01-16 ENCOUNTER — Other Ambulatory Visit (HOSPITAL_COMMUNITY): Payer: Self-pay | Admitting: Oncology

## 2015-01-16 DIAGNOSIS — C719 Malignant neoplasm of brain, unspecified: Secondary | ICD-10-CM

## 2015-01-16 MED ORDER — OXYCODONE HCL 5 MG PO CAPS: 5.0000 mg | ORAL_CAPSULE | ORAL | Status: DC | PRN

## 2015-01-24 ENCOUNTER — Encounter (HOSPITAL_BASED_OUTPATIENT_CLINIC_OR_DEPARTMENT_OTHER): Payer: Medicaid Other | Admitting: Hematology & Oncology

## 2015-01-24 ENCOUNTER — Encounter (HOSPITAL_BASED_OUTPATIENT_CLINIC_OR_DEPARTMENT_OTHER): Payer: Medicaid Other

## 2015-01-24 ENCOUNTER — Encounter (HOSPITAL_COMMUNITY): Payer: Self-pay | Admitting: Hematology & Oncology

## 2015-01-24 VITALS — BP 132/91 | HR 112 | Temp 97.9°F | Resp 18 | Wt 175.1 lb

## 2015-01-24 DIAGNOSIS — C719 Malignant neoplasm of brain, unspecified: Secondary | ICD-10-CM

## 2015-01-24 DIAGNOSIS — C711 Malignant neoplasm of frontal lobe: Secondary | ICD-10-CM

## 2015-01-24 LAB — COMPREHENSIVE METABOLIC PANEL
ALK PHOS: 65 U/L (ref 39–117)
ALT: 20 U/L (ref 0–53)
AST: 19 U/L (ref 0–37)
Albumin: 4 g/dL (ref 3.5–5.2)
Anion gap: 6 (ref 5–15)
BUN: 14 mg/dL (ref 6–23)
CO2: 24 mmol/L (ref 19–32)
Calcium: 8.9 mg/dL (ref 8.4–10.5)
Chloride: 105 mmol/L (ref 96–112)
Creatinine, Ser: 0.93 mg/dL (ref 0.50–1.35)
GFR calc non Af Amer: >90 mL/min (ref >90)
Glucose, Bld: 87 mg/dL (ref 70–99)
POTASSIUM: 3.9 mmol/L (ref 3.5–5.1)
Sodium: 135 mmol/L (ref 135–145)
Total Bilirubin: 0.6 mg/dL (ref 0.3–1.2)
Total Protein: 7.6 g/dL (ref 6.0–8.3)

## 2015-01-24 LAB — CBC WITH DIFFERENTIAL/PLATELET
Basophils Absolute: 0.1 10*3/uL (ref 0.0–0.1)
Basophils Relative: 1 % (ref 0–1)
Eosinophils Absolute: 0.1 10*3/uL (ref 0.0–0.7)
Eosinophils Relative: 1 % (ref 0–5)
HCT: 45.3 % (ref 39.0–52.0)
Hemoglobin: 15.3 g/dL (ref 13.0–17.0)
LYMPHS ABS: 1.7 10*3/uL (ref 0.7–4.0)
Lymphocytes Relative: 14 % (ref 12–46)
MCH: 29.8 pg (ref 26.0–34.0)
MCHC: 33.8 g/dL (ref 30.0–36.0)
MCV: 88.3 fL (ref 78.0–100.0)
MONO ABS: 0.7 10*3/uL (ref 0.1–1.0)
Monocytes Relative: 6 % (ref 3–12)
NEUTROS ABS: 9.1 10*3/uL — AB (ref 1.7–7.7)
Neutrophils Relative %: 78 % — ABNORMAL HIGH (ref 43–77)
Platelets: 275 10*3/uL (ref 150–400)
RBC: 5.13 MIL/uL (ref 4.22–5.81)
RDW: 14.2 % (ref 11.5–15.5)
WBC: 11.7 10*3/uL — AB (ref 4.0–10.5)

## 2015-01-24 MED ORDER — HEPARIN SOD (PORK) LOCK FLUSH 100 UNIT/ML IV SOLN
500.0000 [IU] | Freq: Once | INTRAVENOUS | Status: AC
  Administered 2015-01-24: 500 [IU] via INTRAVENOUS

## 2015-01-24 MED ORDER — HEPARIN SOD (PORK) LOCK FLUSH 100 UNIT/ML IV SOLN
INTRAVENOUS | Status: AC
  Filled 2015-01-24: qty 5

## 2015-01-24 MED ORDER — SODIUM CHLORIDE 0.9 % IJ SOLN
10.0000 mL | INTRAMUSCULAR | Status: AC | PRN
  Administered 2015-01-24: 10 mL via INTRAVENOUS
  Filled 2015-01-24: qty 10

## 2015-01-24 MED ORDER — OXYCODONE-ACETAMINOPHEN 5-325 MG PO TABS: 1.0000 | ORAL_TABLET | ORAL | Status: DC | PRN

## 2015-01-24 NOTE — Patient Instructions (Signed)
[place] at [facility]  Discharge Instructions:  Please call in the interim with any problems or concerns We will see you back prior to your next treatment with labs. _______________________________________________________________  Thank you for choosing [place] at [facility] to provide your oncology and hematology care.  To afford each patient quality time with our providers, please arrive at least 15 minutes before your scheduled appointment.  You need to re-schedule your appointment if you arrive 10 or more minutes late.  We strive to give you quality time with our providers, and arriving late affects you and other patients whose appointments are after yours.  Also, if you no show three or more times for appointments you may be dismissed from the clinic.  Again, thank you for choosing [place] at [place] hope is that these requests will allow you access to exceptional care and in a timely manner. _______________________________________________________________  If you have questions after your visit, please contact our office at [phone] between the hours of 8:30 a.m. and 5:00 p.m. Voicemails left after 4:30 p.m. will not be returned until the following business day. _______________________________________________________________  For prescription refill requests, have your pharmacy contact our office. _______________________________________________________________  Recommendations made by the consultant and any test results will be sent to your referring physician. _______________________________________________________________

## 2015-01-24 NOTE — Progress Notes (Signed)
[name], MD [place] [address]   DIAGNOSIS: Glioblastoma  SUMMARY OF ONCOLOGIC HISTORY: Oncology History   03/03/2014 Partial resection, [name], [facility], Dr. [name] 04/07/2014 Re-resection at [place], Dr. [name]     Malignant brain tumor, left frontal glioblastoma multiforme grade 4   03/03/2014 Initial Diagnosis Malignant brain tumor, left frontal glioblastoma multiforme grade 4, [place], [place]. partial resection, Dr. [name]   04/07/2014 Surgery Re-resection at [place], Dr. Derrill Memo   05/10/2014 - 06/21/2014 Radiation Therapy By Dr. Sondra Come with 6000 cGy in 30 fractions to left frontal brain   07/07/2014 -  Chemotherapy Temodar x5 days every 4 weeks for 2 cycles +2 infusions of patient's own lymphocytes.   08/23/2014 Progression MRI showed progression at [facility]   09/27/2014 -  Chemotherapy Avastin started with irinotecan added on 10/11/2014    12/06/2014 Imaging MRI brain at [facility]- Decreased enhancement, edema, and mass effect. This may represent pseudoresponse related to interval chemotherapy versus true response.    CURRENT THERAPY: Camposar/Avastin  INTERVAL HISTORY: FLYNN GWYN 59 y.o. male returns for follow-up of his gliolbastoma.  He is complaining of a cyst in the groin. He usually sees Dr. Romona Curls for it.  He has been unable to contact him. He has some constipation last pm and took a dulcolax, it is relieved.   He has a return appointment at [facility] on February 10th. He will get another MRI at that time.  He still gets headaches, he takes oxycodone which helps.  MEDICAL HISTORY: Past Medical History  Diagnosis Date  . Glioblastoma   . GERD (gastroesophageal reflux disease)   . Seizures   . Anxiety 09/07/2014    has Malignant brain tumor, left frontal glioblastoma multiforme grade 4; Chronic obstructive pulmonary disease; Seizure disorder; Allergic rhinitis; Acute gouty arthritis; and  Anxiety on his problem list.     has No Known Allergies.  Mr. Bednarczyk had no medications administered during this visit.  SURGICAL HISTORY: Past Surgical History  Procedure Laterality Date  . Salivary gland surgery    . Brain tumor excision      SOCIAL HISTORY: History   Social History  . Marital Status: Single    Spouse Name: N/A    Number of Children: N/A  . Years of Education: N/A   Occupational History  . Not on file.   Social History Main Topics  . Smoking status: Current Every Day Smoker    Types: Cigarettes  . Smokeless tobacco: Never Used  . Alcohol Use: No  . Drug Use: No  . Sexual Activity: Not on file   Other Topics Concern  . Not on file   Social History Narrative    FAMILY HISTORY: Family History  Problem Relation Age of Onset  . Cancer Mother   . Diabetes Mother   . Hypertension Mother   . Stroke Mother   . Diabetes Father     Review of Systems  Constitutional: Negative.   Eyes: Positive for blurred vision. Negative for double vision, photophobia, pain, discharge and redness.       Blurry vision is off/on  Respiratory: Negative.   Cardiovascular: Negative.   Gastrointestinal: Positive for constipation. Negative for heartburn, nausea, vomiting, abdominal pain, diarrhea, blood in stool and melena.  Genitourinary: Positive for frequency. Negative for hematuria and flank pain.       Nocturia  Musculoskeletal:       Left groin pain, "boil"  Skin:  Negative.   Neurological: Positive for headaches. Negative for dizziness, tingling, tremors, sensory change, speech change, focal weakness, seizures and loss of consciousness.  Endo/Heme/Allergies: Negative.   Psychiatric/Behavioral: Negative.     PHYSICAL EXAMINATION  ECOG PERFORMANCE STATUS: 1  Filed Vitals:   01/24/15 0945  BP: 132/91  Pulse: 112  Temp: 97.9 F (36.6 C)  Resp: 18    Physical Exam  Constitutional: He is oriented to person, place, and time and well-developed,  well-nourished, and in no distress.  HENT:  Head: Normocephalic and atraumatic.  Nose: Nose normal.  Mouth/Throat: Oropharynx is clear and moist. No oropharyngeal exudate.  Prior craniotomy incision is well healed  Eyes: Conjunctivae and EOM are normal. Pupils are equal, round, and reactive to light. Right eye exhibits no discharge. Left eye exhibits no discharge. No scleral icterus.  Neck: Normal range of motion. Neck supple. No tracheal deviation present. No thyromegaly present.  Cardiovascular: Normal rate, regular rhythm and normal heart sounds.  Exam reveals no gallop and no friction rub.   No murmur heard. Pulmonary/Chest: Effort normal and breath sounds normal. He has no wheezes. He has no rales.  Abdominal: Soft. Bowel sounds are normal. He exhibits no distension and no mass. There is no tenderness. There is no rebound and no guarding.  Musculoskeletal: Normal range of motion. He exhibits no edema.  Lymphadenopathy:    He has no cervical adenopathy.  Neurological: He is alert and oriented to person, place, and time. He has normal reflexes. No cranial nerve deficit. Gait normal. Coordination normal.  Skin: Skin is warm and dry. No rash noted.  Psychiatric: Mood, memory, affect and judgment normal.  Nursing note and vitals reviewed.   LABORATORY DATA:  CBC    Component Value Date/Time   WBC 11.7* 01/24/2015 1022   RBC 5.13 01/24/2015 1022   HGB 15.3 01/24/2015 1022   HCT 45.3 01/24/2015 1022   PLT 275 01/24/2015 1022   MCV 88.3 01/24/2015 1022   MCH 29.8 01/24/2015 1022   MCHC 33.8 01/24/2015 1022   RDW 14.2 01/24/2015 1022   LYMPHSABS 1.7 01/24/2015 1022   MONOABS 0.7 01/24/2015 1022   EOSABS 0.1 01/24/2015 1022   BASOSABS 0.1 01/24/2015 1022   CMP     Component Value Date/Time   NA 135 01/24/2015 1022   K 3.9 01/24/2015 1022   CL 105 01/24/2015 1022   CO2 24 01/24/2015 1022   GLUCOSE 87 01/24/2015 1022   BUN 14 01/24/2015 1022   CREATININE 0.93 01/24/2015 1022    CALCIUM 8.9 01/24/2015 1022   PROT 7.6 01/24/2015 1022   ALBUMIN 4.0 01/24/2015 1022   AST 19 01/24/2015 1022   ALT 20 01/24/2015 1022   ALKPHOS 65 01/24/2015 1022   BILITOT 0.6 01/24/2015 1022   GFRNONAA >90 01/24/2015 1022   GFRAA >90 01/24/2015 1022      ASSESSMENT and THERAPY PLAN:    Malignant brain tumor, left frontal glioblastoma multiforme grade 73 59 year old male with glioblastoma, followed at [facility].  He has another appointment and MRI scheduled on February 10th.  He is ok for treatment today.  He is having a lot of "social" issues and financial issues which are currently his biggest struggles.  He still gets intermittent headaches, these are chronic and unchanged. No other complaints today.  We will see him back in 2 weeks with repeat labs and his next cycle of chemotherapy.     All questions were answered. The patient knows to call the  clinic with any problems, questions or concerns. We can certainly see the patient much sooner if necessary.  Vashaun Osmon Kristen 01/25/2015    CBC Latest Ref Rng 01/24/2015 01/11/2015 12/27/2014  WBC 4.0 - 10.5 K/uL 11.7(H) 7.1 6.6  Hemoglobin 13.0 - 17.0 g/dL 15.3 14.1 13.7  Hematocrit 39.0 - 52.0 % 45.3 43.1 41.4  Platelets 150 - 400 K/uL 275 267 233    Filed Weights   01/24/15 0945  Weight: 175 lb 1.6 oz (79.425 kg)

## 2015-01-25 ENCOUNTER — Other Ambulatory Visit (HOSPITAL_COMMUNITY): Payer: Self-pay | Admitting: Hematology & Oncology

## 2015-01-25 ENCOUNTER — Encounter: Payer: Self-pay | Admitting: *Deleted

## 2015-01-25 MED ORDER — ALPRAZOLAM 1 MG PO TABS: 1.0000 mg | ORAL_TABLET | Freq: Three times a day (TID) | ORAL | Status: DC | PRN

## 2015-01-25 NOTE — Progress Notes (Signed)
[place] Work  Clinical Social Work was referred by patient for assessment of psychosocial needs due to financial and insurance concerns.  Clinical Social Worker contacted patient at home to offer support and assess for needs.  Pt reports there appears to have been a mixup with his insurance after switching to medicaid and some medical bills have not been covered. Pt is very stressed as a result. He reports he is working with Customer service manager at [employer] to try to resolve the issue, but this has led to much stress. CSW limited in how CSW can assist as CSW cannot see financial records. Pt worried he has bills from [facility] as well. CSW making referral to Lendell Caprice, financial advocate for further assistance. CSW provided emotional support and will try to locate additional financial resource options. Pt appreciated any assistance with this issue.   Clinical Social Work interventions: Resource assistance Referral to financial counselor Emotional support  Loren Racer, [place] Worker [place]  [place] Phone: [phone] Fax: [phone]

## 2015-01-25 NOTE — Assessment & Plan Note (Signed)
59 year old male with glioblastoma, followed at [facility].  He has another appointment and MRI scheduled on February 10th.  He is ok for treatment today.  He is having a lot of "social" issues and financial issues which are currently his biggest struggles.  He still gets intermittent headaches, these are chronic and unchanged. No other complaints today.  We will see him back in 2 weeks with repeat labs and his next cycle of chemotherapy.

## 2015-01-25 NOTE — Progress Notes (Signed)
[place] Clinical Social Work  Clinical Social Work was referred by patient for assessment of psychosocial needs due to     .  Clinical Education officer, museum (met with or contacted) patient (at [facility], at home, or in the hospital) to offer support and assess for needs.      Clinical Social Work interventions:    Opened in error  [employer], [place] Tuesdays 8:30-1pm Wednesdays 8:30-12pm  [phone]

## 2015-01-26 ENCOUNTER — Encounter (HOSPITAL_COMMUNITY): Payer: Medicaid Other | Attending: Hematology & Oncology

## 2015-01-26 VITALS — BP 110/56 | HR 81 | Temp 97.7°F | Resp 20 | Wt 176.0 lb

## 2015-01-26 DIAGNOSIS — C719 Malignant neoplasm of brain, unspecified: Secondary | ICD-10-CM | POA: Insufficient documentation

## 2015-01-26 DIAGNOSIS — R569 Unspecified convulsions: Secondary | ICD-10-CM | POA: Insufficient documentation

## 2015-01-26 DIAGNOSIS — K219 Gastro-esophageal reflux disease without esophagitis: Secondary | ICD-10-CM | POA: Diagnosis not present

## 2015-01-26 DIAGNOSIS — Z5112 Encounter for antineoplastic immunotherapy: Secondary | ICD-10-CM

## 2015-01-26 DIAGNOSIS — F419 Anxiety disorder, unspecified: Secondary | ICD-10-CM | POA: Diagnosis not present

## 2015-01-26 DIAGNOSIS — C711 Malignant neoplasm of frontal lobe: Secondary | ICD-10-CM

## 2015-01-26 DIAGNOSIS — Z5111 Encounter for antineoplastic chemotherapy: Secondary | ICD-10-CM

## 2015-01-26 LAB — URINALYSIS, DIPSTICK ONLY
Bilirubin Urine: NEGATIVE
GLUCOSE, UA: NEGATIVE mg/dL
HGB URINE DIPSTICK: NEGATIVE
Ketones, ur: NEGATIVE mg/dL
Leukocytes, UA: NEGATIVE
Nitrite: NEGATIVE
PH: 5.5 (ref 5.0–8.0)
PROTEIN: NEGATIVE mg/dL
Specific Gravity, Urine: 1.015 (ref 1.005–1.030)
Urobilinogen, UA: 0.2 mg/dL (ref 0.0–1.0)

## 2015-01-26 MED ORDER — HEPARIN SOD (PORK) LOCK FLUSH 100 UNIT/ML IV SOLN
500.0000 [IU] | Freq: Once | INTRAVENOUS | Status: AC | PRN
Start: 2015-01-26 — End: 2015-01-26
  Administered 2015-01-26: 500 [IU]

## 2015-01-26 MED ORDER — DEXTROSE 5 % IV SOLN
125.0000 mg/m2 | Freq: Once | INTRAVENOUS | Status: AC
  Administered 2015-01-26: 244 mg via INTRAVENOUS
  Filled 2015-01-26: qty 4.07

## 2015-01-26 MED ORDER — ONDANSETRON HCL 40 MG/20ML IJ SOLN: 16.0000 mg | Freq: Once | INTRAMUSCULAR | Status: DC

## 2015-01-26 MED ORDER — ONDANSETRON HCL 40 MG/20ML IJ SOLN
Freq: Once | INTRAMUSCULAR | Status: AC
  Administered 2015-01-26: 16 mg via INTRAVENOUS
  Filled 2015-01-26: qty 8

## 2015-01-26 MED ORDER — HEPARIN SOD (PORK) LOCK FLUSH 100 UNIT/ML IV SOLN
INTRAVENOUS | Status: AC
  Filled 2015-01-26: qty 5

## 2015-01-26 MED ORDER — DEXAMETHASONE SODIUM PHOSPHATE 20 MG/5ML IJ SOLN: 12.0000 mg | Freq: Once | INTRAMUSCULAR | Status: DC

## 2015-01-26 MED ORDER — SODIUM CHLORIDE 0.9 % IV SOLN
Freq: Once | INTRAVENOUS | Status: AC
  Administered 2015-01-26: 09:00:00 via INTRAVENOUS

## 2015-01-26 MED ORDER — SODIUM CHLORIDE 0.9 % IJ SOLN
10.0000 mL | INTRAMUSCULAR | Status: DC | PRN
  Administered 2015-01-26: 10 mL
  Filled 2015-01-26: qty 10

## 2015-01-26 MED ORDER — ATROPINE SULFATE 1 MG/ML IJ SOLN
1.0000 mg | Freq: Once | INTRAMUSCULAR | Status: AC | PRN
  Administered 2015-01-26: 1 mg via INTRAVENOUS
  Filled 2015-01-26: qty 1

## 2015-01-26 MED ORDER — BEVACIZUMAB CHEMO INJECTION 400 MG/16ML
10.0000 mg/kg | Freq: Once | INTRAVENOUS | Status: AC
  Administered 2015-01-26: 775 mg via INTRAVENOUS
  Filled 2015-01-26: qty 31

## 2015-01-26 NOTE — Patient Instructions (Signed)
[facility] Discharge Instructions for Patients Receiving Chemotherapy  Today you received the following chemotherapy agents Irinotecan and Avastin.  To help prevent nausea and vomiting after your treatment, we encourage you to take your nausea medication as instructed.  If you develop nausea and vomiting that is not controlled by your nausea medication, call the clinic. If it is after clinic hours your family physician or the after hours number for the clinic or go to the Emergency Department. BELOW ARE SYMPTOMS THAT SHOULD BE REPORTED IMMEDIATELY:  *FEVER GREATER THAN 101.0 F  *CHILLS WITH OR WITHOUT FEVER  NAUSEA AND VOMITING THAT IS NOT CONTROLLED WITH YOUR NAUSEA MEDICATION  *UNUSUAL SHORTNESS OF BREATH  *UNUSUAL BRUISING OR BLEEDING  TENDERNESS IN MOUTH AND THROAT WITH OR WITHOUT PRESENCE OF ULCERS  *URINARY PROBLEMS  *BOWEL PROBLEMS  UNUSUAL RASH Items with * indicate a potential emergency and should be followed up as soon as possible.  Return as scheduled.  I have been informed and understand all the instructions given to me. I know to contact the clinic, my physician, or go to the Emergency Department if any problems should occur. I do not have any questions at this time, but understand that I may call the clinic during office hours or the Patient Navigator at [phone] should I have any questions or need assistance in obtaining follow up care.    __________________________________________  _____________  __________ Signature of Patient or Authorized Representative            Date                   Time    __________________________________________ Nurse's Signature  

## 2015-01-26 NOTE — Progress Notes (Signed)
Tolerated chemo well. 

## 2015-01-29 ENCOUNTER — Other Ambulatory Visit (HOSPITAL_COMMUNITY): Payer: Self-pay | Admitting: Oncology

## 2015-01-29 DIAGNOSIS — C719 Malignant neoplasm of brain, unspecified: Secondary | ICD-10-CM

## 2015-01-29 MED ORDER — OXYCODONE HCL 5 MG PO CAPS: 5.0000 mg | ORAL_CAPSULE | ORAL | Status: DC | PRN

## 2015-02-07 ENCOUNTER — Ambulatory Visit (HOSPITAL_COMMUNITY): Payer: Medicaid Other | Admitting: Oncology

## 2015-02-07 ENCOUNTER — Telehealth (HOSPITAL_COMMUNITY): Payer: Self-pay

## 2015-02-07 ENCOUNTER — Other Ambulatory Visit (HOSPITAL_COMMUNITY): Payer: Self-pay | Admitting: Oncology

## 2015-02-07 ENCOUNTER — Inpatient Hospital Stay (HOSPITAL_COMMUNITY): Payer: Medicaid Other

## 2015-02-07 NOTE — Telephone Encounter (Signed)
Per Cody Buchanan, Cody Buchanan is being seen at [facility] today and has had repeated abscesses to groin area.  PCP has prescribed Doxycycline and will refer to surgeon for drainage.  Recommendations are to have him be seen & treated by surgeon due to the potential for necrosis in this area, hold avastin for at least 3 weeks after surgery but to continue CPT-ll and treat this week if possible.  NP can be reached via pager [phone].  She plans to contact PCP to expedite referral to surgeon and will fax her note from today.

## 2015-02-08 ENCOUNTER — Ambulatory Visit (HOSPITAL_COMMUNITY): Payer: Medicaid Other | Admitting: Physical Therapy

## 2015-02-09 ENCOUNTER — Encounter (HOSPITAL_COMMUNITY): Payer: Self-pay

## 2015-02-09 ENCOUNTER — Encounter (HOSPITAL_COMMUNITY): Payer: Medicaid Other | Attending: Hematology and Oncology

## 2015-02-09 DIAGNOSIS — C711 Malignant neoplasm of frontal lobe: Secondary | ICD-10-CM

## 2015-02-09 DIAGNOSIS — Z5111 Encounter for antineoplastic chemotherapy: Secondary | ICD-10-CM

## 2015-02-09 DIAGNOSIS — C719 Malignant neoplasm of brain, unspecified: Secondary | ICD-10-CM | POA: Diagnosis not present

## 2015-02-09 LAB — COMPREHENSIVE METABOLIC PANEL
ALBUMIN: 3.8 g/dL (ref 3.5–5.2)
ALT: 17 U/L (ref 0–53)
AST: 19 U/L (ref 0–37)
Alkaline Phosphatase: 62 U/L (ref 39–117)
Anion gap: 5 (ref 5–15)
BUN: 14 mg/dL (ref 6–23)
CALCIUM: 8.6 mg/dL (ref 8.4–10.5)
CO2: 24 mmol/L (ref 19–32)
CREATININE: 1.15 mg/dL (ref 0.50–1.35)
Chloride: 107 mmol/L (ref 96–112)
GFR calc Af Amer: 80 mL/min — ABNORMAL LOW (ref >90)
GFR calc non Af Amer: 69 mL/min — ABNORMAL LOW (ref >90)
Glucose, Bld: 100 mg/dL — ABNORMAL HIGH (ref 70–99)
Potassium: 3.7 mmol/L (ref 3.5–5.1)
Sodium: 136 mmol/L (ref 135–145)
Total Bilirubin: 0.5 mg/dL (ref 0.3–1.2)
Total Protein: 7 g/dL (ref 6.0–8.3)

## 2015-02-09 LAB — CBC WITH DIFFERENTIAL/PLATELET
BASOS ABS: 0 10*3/uL (ref 0.0–0.1)
BASOS PCT: 1 % (ref 0–1)
Eosinophils Absolute: 0.1 10*3/uL (ref 0.0–0.7)
Eosinophils Relative: 2 % (ref 0–5)
HEMATOCRIT: 44.7 % (ref 39.0–52.0)
Hemoglobin: 15 g/dL (ref 13.0–17.0)
LYMPHS PCT: 16 % (ref 12–46)
Lymphs Abs: 1.2 10*3/uL (ref 0.7–4.0)
MCH: 29.1 pg (ref 26.0–34.0)
MCHC: 33.6 g/dL (ref 30.0–36.0)
MCV: 86.8 fL (ref 78.0–100.0)
MONO ABS: 0.6 10*3/uL (ref 0.1–1.0)
Monocytes Relative: 8 % (ref 3–12)
NEUTROS ABS: 5.7 10*3/uL (ref 1.7–7.7)
NEUTROS PCT: 73 % (ref 43–77)
PLATELETS: 237 10*3/uL (ref 150–400)
RBC: 5.15 MIL/uL (ref 4.22–5.81)
RDW: 14.7 % (ref 11.5–15.5)
WBC: 7.7 10*3/uL (ref 4.0–10.5)

## 2015-02-09 MED ORDER — OXYCODONE HCL 5 MG PO CAPS: 5.0000 mg | ORAL_CAPSULE | ORAL | Status: DC | PRN

## 2015-02-09 MED ORDER — DEXAMETHASONE SODIUM PHOSPHATE 20 MG/5ML IJ SOLN: 12.0000 mg | Freq: Once | INTRAMUSCULAR | Status: DC

## 2015-02-09 MED ORDER — SODIUM CHLORIDE 0.9 % IV SOLN: 16.0000 mg | Freq: Once | INTRAVENOUS | Status: DC

## 2015-02-09 MED ORDER — IRINOTECAN HCL CHEMO INJECTION 100 MG/5ML
125.0000 mg/m2 | Freq: Once | INTRAVENOUS | Status: AC
  Administered 2015-02-09: 244 mg via INTRAVENOUS
  Filled 2015-02-09: qty 4.07

## 2015-02-09 MED ORDER — ALPRAZOLAM 1 MG PO TABS: 1.0000 mg | ORAL_TABLET | Freq: Three times a day (TID) | ORAL | Status: DC | PRN

## 2015-02-09 MED ORDER — SODIUM CHLORIDE 0.9 % IV SOLN
Freq: Once | INTRAVENOUS | Status: AC
  Administered 2015-02-09: 16 mg via INTRAVENOUS
  Filled 2015-02-09: qty 8

## 2015-02-09 MED ORDER — HEPARIN SOD (PORK) LOCK FLUSH 100 UNIT/ML IV SOLN
500.0000 [IU] | Freq: Once | INTRAVENOUS | Status: AC | PRN
  Administered 2015-02-09: 500 [IU]
  Filled 2015-02-09 (×2): qty 5

## 2015-02-09 MED ORDER — SODIUM CHLORIDE 0.9 % IJ SOLN: 10.0000 mL | INTRAMUSCULAR | Status: DC | PRN

## 2015-02-09 MED ORDER — SODIUM CHLORIDE 0.9 % IV SOLN
Freq: Once | INTRAVENOUS | Status: AC
  Administered 2015-02-09: 09:00:00 via INTRAVENOUS

## 2015-02-09 NOTE — Patient Instructions (Signed)
[facility] Discharge Instructions for Patients Receiving Chemotherapy  Today you received the following chemotherapy agents Camptosar. Follow up as scheduled. Please call for any questions or concerns.   If you develop nausea and vomiting, or diarrhea that is not controlled by your medication, call the clinic.  The clinic phone number is [phone]. Office hours are Monday-Friday 8:30am-5:00pm.  BELOW ARE SYMPTOMS THAT SHOULD BE REPORTED IMMEDIATELY:  *FEVER GREATER THAN 101.0 F  *CHILLS WITH OR WITHOUT FEVER  NAUSEA AND VOMITING THAT IS NOT CONTROLLED WITH YOUR NAUSEA MEDICATION  *UNUSUAL SHORTNESS OF BREATH  *UNUSUAL BRUISING OR BLEEDING  TENDERNESS IN MOUTH AND THROAT WITH OR WITHOUT PRESENCE OF ULCERS  *URINARY PROBLEMS  *BOWEL PROBLEMS  UNUSUAL RASH Items with * indicate a potential emergency and should be followed up as soon as possible. If you have an emergency after office hours please contact your primary care physician or go to the nearest emergency department.  Please call the clinic during office hours if you have any questions or concerns.   You may also contact the Patient Navigator at [phone] should you have any questions or need assistance in obtaining follow up care. _____________________________________________________________________ Have you asked about our STAR program?    STAR stands for Survivorship Training and Rehabilitation, and this is a nationally recognized cancer care program that focuses on survivorship and rehabilitation.  Cancer and cancer treatments may cause problems, such as, pain, making you feel tired and keeping you from doing the things that you need or want to do. Cancer rehabilitation can help. Our goal is to reduce these troubling effects and help you have the best quality of life possible.  You may receive a survey from a nurse that asks questions about your current state of health.  Based on the survey  results, all eligible patients will be referred to the [facility] program for an evaluation so we can better serve you! A frequently asked questions sheet is available upon request.

## 2015-02-09 NOTE — Progress Notes (Signed)
Patient tolerated treatment well.

## 2015-02-21 ENCOUNTER — Encounter: Payer: Self-pay | Admitting: *Deleted

## 2015-02-21 ENCOUNTER — Encounter (HOSPITAL_BASED_OUTPATIENT_CLINIC_OR_DEPARTMENT_OTHER): Payer: Medicaid Other | Admitting: Oncology

## 2015-02-21 ENCOUNTER — Encounter (HOSPITAL_BASED_OUTPATIENT_CLINIC_OR_DEPARTMENT_OTHER): Payer: Medicaid Other

## 2015-02-21 DIAGNOSIS — C719 Malignant neoplasm of brain, unspecified: Secondary | ICD-10-CM

## 2015-02-21 DIAGNOSIS — J069 Acute upper respiratory infection, unspecified: Secondary | ICD-10-CM

## 2015-02-21 DIAGNOSIS — Z5112 Encounter for antineoplastic immunotherapy: Secondary | ICD-10-CM

## 2015-02-21 DIAGNOSIS — C711 Malignant neoplasm of frontal lobe: Secondary | ICD-10-CM

## 2015-02-21 DIAGNOSIS — Z5111 Encounter for antineoplastic chemotherapy: Secondary | ICD-10-CM

## 2015-02-21 LAB — CBC WITH DIFFERENTIAL/PLATELET
BASOS PCT: 0 % (ref 0–1)
Basophils Absolute: 0 10*3/uL (ref 0.0–0.1)
EOS ABS: 0.1 10*3/uL (ref 0.0–0.7)
EOS PCT: 1 % (ref 0–5)
HEMATOCRIT: 41.8 % (ref 39.0–52.0)
HEMOGLOBIN: 14.1 g/dL (ref 13.0–17.0)
Lymphocytes Relative: 18 % (ref 12–46)
Lymphs Abs: 1.3 10*3/uL (ref 0.7–4.0)
MCH: 29.4 pg (ref 26.0–34.0)
MCHC: 33.7 g/dL (ref 30.0–36.0)
MCV: 87.1 fL (ref 78.0–100.0)
MONO ABS: 0.5 10*3/uL (ref 0.1–1.0)
MONOS PCT: 7 % (ref 3–12)
Neutro Abs: 5.2 10*3/uL (ref 1.7–7.7)
Neutrophils Relative %: 74 % (ref 43–77)
Platelets: 227 10*3/uL (ref 150–400)
RBC: 4.8 MIL/uL (ref 4.22–5.81)
RDW: 14.6 % (ref 11.5–15.5)
WBC: 7.1 10*3/uL (ref 4.0–10.5)

## 2015-02-21 LAB — COMPREHENSIVE METABOLIC PANEL
ALBUMIN: 3.7 g/dL (ref 3.5–5.2)
ALK PHOS: 57 U/L (ref 39–117)
ALT: 20 U/L (ref 0–53)
AST: 18 U/L (ref 0–37)
BILIRUBIN TOTAL: 0.3 mg/dL (ref 0.3–1.2)
BUN: 15 mg/dL (ref 6–23)
CO2: 25 mmol/L (ref 19–32)
CREATININE: 1.02 mg/dL (ref 0.50–1.35)
Calcium: 8.6 mg/dL (ref 8.4–10.5)
Chloride: 111 mmol/L (ref 96–112)
GFR, EST NON AFRICAN AMERICAN: 80 mL/min — AB (ref >90)
Glucose, Bld: 75 mg/dL (ref 70–99)
Potassium: 3.9 mmol/L (ref 3.5–5.1)
Sodium: 136 mmol/L (ref 135–145)
TOTAL PROTEIN: 6.9 g/dL (ref 6.0–8.3)

## 2015-02-21 LAB — URINALYSIS, DIPSTICK ONLY
Bilirubin Urine: NEGATIVE
GLUCOSE, UA: NEGATIVE mg/dL
Hgb urine dipstick: NEGATIVE
Ketones, ur: NEGATIVE mg/dL
LEUKOCYTES UA: NEGATIVE
Nitrite: NEGATIVE
Protein, ur: NEGATIVE mg/dL
SPECIFIC GRAVITY, URINE: 1.02 (ref 1.005–1.030)
Urobilinogen, UA: 0.2 mg/dL (ref 0.0–1.0)
pH: 5.5 (ref 5.0–8.0)

## 2015-02-21 MED ORDER — ATROPINE SULFATE 1 MG/ML IJ SOLN
1.0000 mg | Freq: Once | INTRAMUSCULAR | Status: AC | PRN
  Administered 2015-02-21: 1 mg via INTRAVENOUS
  Filled 2015-02-21: qty 1

## 2015-02-21 MED ORDER — OXYCODONE HCL 5 MG PO CAPS: 5.0000 mg | ORAL_CAPSULE | ORAL | Status: DC | PRN

## 2015-02-21 MED ORDER — AMOXICILLIN-POT CLAVULANATE 875-125 MG PO TABS: 1.0000 | ORAL_TABLET | Freq: Two times a day (BID) | ORAL | Status: AC

## 2015-02-21 MED ORDER — HEPARIN SOD (PORK) LOCK FLUSH 100 UNIT/ML IV SOLN
500.0000 [IU] | Freq: Once | INTRAVENOUS | Status: AC | PRN
  Administered 2015-02-21: 500 [IU]
  Filled 2015-02-21: qty 5

## 2015-02-21 MED ORDER — SODIUM CHLORIDE 0.9 % IV SOLN
10.0000 mg/kg | Freq: Once | INTRAVENOUS | Status: AC
  Administered 2015-02-21: 775 mg via INTRAVENOUS
  Filled 2015-02-21: qty 31

## 2015-02-21 MED ORDER — SODIUM CHLORIDE 0.9 % IV SOLN
Freq: Once | INTRAVENOUS | Status: AC
  Administered 2015-02-21: 16 mg via INTRAVENOUS
  Filled 2015-02-21: qty 8

## 2015-02-21 MED ORDER — SODIUM CHLORIDE 0.9 % IJ SOLN
10.0000 mL | INTRAMUSCULAR | Status: DC | PRN
  Administered 2015-02-21: 10 mL
  Filled 2015-02-21: qty 10

## 2015-02-21 MED ORDER — IRINOTECAN HCL CHEMO INJECTION 100 MG/5ML
125.0000 mg/m2 | Freq: Once | INTRAVENOUS | Status: AC
  Administered 2015-02-21: 244 mg via INTRAVENOUS
  Filled 2015-02-21: qty 4.07

## 2015-02-21 MED ORDER — SODIUM CHLORIDE 0.9 % IV SOLN
Freq: Once | INTRAVENOUS | Status: AC
  Administered 2015-02-21: 11:00:00 via INTRAVENOUS

## 2015-02-21 NOTE — Patient Instructions (Signed)
[facility] Discharge Instructions for Patients Receiving Chemotherapy  Today you received the following chemotherapy agents Avastin and Irinotecan. To help prevent nausea and vomiting after your treatment, we encourage you to take your nausea medication as instructed. If you develop nausea and vomiting that is not controlled by your nausea medication, call the clinic. If it is after clinic hours your family physician or the after hours number for the clinic or go to the Emergency Department. BELOW ARE SYMPTOMS THAT SHOULD BE REPORTED IMMEDIATELY:  *FEVER GREATER THAN 101.0 F  *CHILLS WITH OR WITHOUT FEVER  NAUSEA AND VOMITING THAT IS NOT CONTROLLED WITH YOUR NAUSEA MEDICATION  *UNUSUAL SHORTNESS OF BREATH  *UNUSUAL BRUISING OR BLEEDING  TENDERNESS IN MOUTH AND THROAT WITH OR WITHOUT PRESENCE OF ULCERS  *URINARY PROBLEMS  *BOWEL PROBLEMS  UNUSUAL RASH Items with * indicate a potential emergency and should be followed up as soon as possible.  Return as scheduled.  I have been informed and understand all the instructions given to me. I know to contact the clinic, my physician, or go to the Emergency Department if any problems should occur. I do not have any questions at this time, but understand that I may call the clinic during office hours or the Patient Navigator at [phone] should I have any questions or need assistance in obtaining follow up care.    __________________________________________  _____________  __________ Signature of Patient or Authorized Representative            Date                   Time    __________________________________________ Nurse's Signature

## 2015-02-21 NOTE — Progress Notes (Signed)
[place] Clinical Social Work  Clinical Social Work was referred by [place] rounding for assessment of psychosocial needs due to previous financial concerns and depression.    Clinical Social Worker met with or [place] representative met with or patient at [facility] to offer support and assess for needs.  Pt now needs help with transportation and CSW discussed options such as [employer] and Duanne [place] [employer] as another resource. Pt plans to reach out to both and also apply for food stamps. CSW encouraged pt to attend [facility] in [place] as well and may attend next week, but transportation is an issue. CSW provided supportive listening and feels less depressed this visit. Pt agrees to reach out to CSW as needed.    Clinical Social Work interventions:  Resource education Supportive listening    Loren Racer, [place] Tuesdays 8:30-1pm Wednesdays 8:30-12pm  [phone]

## 2015-02-21 NOTE — Progress Notes (Signed)
Cody Buchanan is seen as a work-in today for the following reasons:  1. URI with sore throat.  He reports nasal discharge and cough productive of sputum.  I will give him Augmentin x 7 days.  Exam is impressive for posterior pharynx erythema with a left angle of the jaw thickening, fibrosis versus adenopathy. 2. Groin infection is all cleared up.  No open wounds.  I have restarted Avastin today as a result.  3. Refill on pain medication.   Return as scheduled for follow-up.  Patient and plan discussed with Dr. Ancil Linsey and she is in agreement with the aforementioned.   Betty Daidone 02/21/2015

## 2015-02-21 NOTE — Progress Notes (Signed)
Tolerated chemo well. 

## 2015-03-07 ENCOUNTER — Encounter: Payer: Self-pay | Admitting: *Deleted

## 2015-03-07 ENCOUNTER — Encounter (HOSPITAL_COMMUNITY): Payer: Medicaid Other | Attending: Hematology and Oncology

## 2015-03-07 ENCOUNTER — Encounter (HOSPITAL_COMMUNITY): Payer: Self-pay

## 2015-03-07 VITALS — BP 151/94 | HR 71 | Temp 97.9°F | Resp 20

## 2015-03-07 DIAGNOSIS — Z5111 Encounter for antineoplastic chemotherapy: Secondary | ICD-10-CM

## 2015-03-07 DIAGNOSIS — C719 Malignant neoplasm of brain, unspecified: Secondary | ICD-10-CM

## 2015-03-07 DIAGNOSIS — Z5112 Encounter for antineoplastic immunotherapy: Secondary | ICD-10-CM

## 2015-03-07 DIAGNOSIS — C711 Malignant neoplasm of frontal lobe: Secondary | ICD-10-CM

## 2015-03-07 LAB — COMPREHENSIVE METABOLIC PANEL
ALBUMIN: 3.5 g/dL (ref 3.5–5.2)
ALT: 19 U/L (ref 0–53)
AST: 20 U/L (ref 0–37)
Alkaline Phosphatase: 59 U/L (ref 39–117)
Anion gap: 8 (ref 5–15)
BILIRUBIN TOTAL: 0.5 mg/dL (ref 0.3–1.2)
BUN: 14 mg/dL (ref 6–23)
CO2: 23 mmol/L (ref 19–32)
Calcium: 8.5 mg/dL (ref 8.4–10.5)
Chloride: 108 mmol/L (ref 96–112)
Creatinine, Ser: 1.32 mg/dL (ref 0.50–1.35)
GFR calc Af Amer: 67 mL/min — ABNORMAL LOW (ref >90)
GFR, EST NON AFRICAN AMERICAN: 58 mL/min — AB (ref >90)
Glucose, Bld: 98 mg/dL (ref 70–99)
POTASSIUM: 3.6 mmol/L (ref 3.5–5.1)
SODIUM: 139 mmol/L (ref 135–145)
TOTAL PROTEIN: 6.4 g/dL (ref 6.0–8.3)

## 2015-03-07 LAB — CBC WITH DIFFERENTIAL/PLATELET
BASOS ABS: 0 10*3/uL (ref 0.0–0.1)
Basophils Relative: 1 % (ref 0–1)
EOS ABS: 0.1 10*3/uL (ref 0.0–0.7)
Eosinophils Relative: 2 % (ref 0–5)
HEMATOCRIT: 40.7 % (ref 39.0–52.0)
Hemoglobin: 13.6 g/dL (ref 13.0–17.0)
LYMPHS ABS: 1.3 10*3/uL (ref 0.7–4.0)
Lymphocytes Relative: 19 % (ref 12–46)
MCH: 29.1 pg (ref 26.0–34.0)
MCHC: 33.4 g/dL (ref 30.0–36.0)
MCV: 87.2 fL (ref 78.0–100.0)
Monocytes Absolute: 0.5 10*3/uL (ref 0.1–1.0)
Monocytes Relative: 7 % (ref 3–12)
Neutro Abs: 4.6 10*3/uL (ref 1.7–7.7)
Neutrophils Relative %: 71 % (ref 43–77)
Platelets: 224 10*3/uL (ref 150–400)
RBC: 4.67 MIL/uL (ref 4.22–5.81)
RDW: 15.3 % (ref 11.5–15.5)
WBC: 6.5 10*3/uL (ref 4.0–10.5)

## 2015-03-07 LAB — URINALYSIS, DIPSTICK ONLY
BILIRUBIN URINE: NEGATIVE
GLUCOSE, UA: NEGATIVE mg/dL
Hgb urine dipstick: NEGATIVE
Ketones, ur: NEGATIVE mg/dL
LEUKOCYTES UA: NEGATIVE
Nitrite: NEGATIVE
Protein, ur: NEGATIVE mg/dL
SPECIFIC GRAVITY, URINE: 1.025 (ref 1.005–1.030)
Urobilinogen, UA: 0.2 mg/dL (ref 0.0–1.0)
pH: 5.5 (ref 5.0–8.0)

## 2015-03-07 MED ORDER — HEPARIN SOD (PORK) LOCK FLUSH 100 UNIT/ML IV SOLN
500.0000 [IU] | Freq: Once | INTRAVENOUS | Status: AC | PRN
  Administered 2015-03-07: 500 [IU]

## 2015-03-07 MED ORDER — SODIUM CHLORIDE 0.9 % IJ SOLN
10.0000 mL | INTRAMUSCULAR | Status: DC | PRN
  Administered 2015-03-07: 10 mL
  Filled 2015-03-07: qty 10

## 2015-03-07 MED ORDER — ALPRAZOLAM 1 MG PO TABS
1.0000 mg | ORAL_TABLET | Freq: Three times a day (TID) | ORAL | Status: DC | PRN
Start: 2015-03-07 — End: 2015-04-18

## 2015-03-07 MED ORDER — SODIUM CHLORIDE 0.9 % IV SOLN
10.0000 mg/kg | Freq: Once | INTRAVENOUS | Status: AC
  Administered 2015-03-07: 775 mg via INTRAVENOUS
  Filled 2015-03-07: qty 31

## 2015-03-07 MED ORDER — SODIUM CHLORIDE 0.9 % IV SOLN
Freq: Once | INTRAVENOUS | Status: AC
  Administered 2015-03-07: 16 mg via INTRAVENOUS
  Filled 2015-03-07: qty 8

## 2015-03-07 MED ORDER — OXYCODONE HCL 5 MG PO CAPS: 5.0000 mg | ORAL_CAPSULE | ORAL | Status: DC | PRN

## 2015-03-07 MED ORDER — HEPARIN SOD (PORK) LOCK FLUSH 100 UNIT/ML IV SOLN
INTRAVENOUS | Status: AC
  Filled 2015-03-07: qty 5

## 2015-03-07 MED ORDER — IRINOTECAN HCL CHEMO INJECTION 100 MG/5ML
125.0000 mg/m2 | Freq: Once | INTRAVENOUS | Status: AC
  Administered 2015-03-07: 244 mg via INTRAVENOUS
  Filled 2015-03-07: qty 4.07

## 2015-03-07 MED ORDER — SODIUM CHLORIDE 0.9 % IV SOLN
Freq: Once | INTRAVENOUS | Status: AC
  Administered 2015-03-07: 10:00:00 via INTRAVENOUS

## 2015-03-07 NOTE — Patient Instructions (Signed)
[facility] Discharge Instructions for Patients Receiving Chemotherapy  Today you received the following chemotherapy agents: irinotecan and Avastin  If you develop nausea and vomiting, or diarrhea that is not controlled by your medication, call the clinic.  The clinic phone number is [phone]. Office hours are Monday-Friday 8:30am-5:00pm.  BELOW ARE SYMPTOMS THAT SHOULD BE REPORTED IMMEDIATELY:  *FEVER GREATER THAN 101.0 F  *CHILLS WITH OR WITHOUT FEVER  NAUSEA AND VOMITING THAT IS NOT CONTROLLED WITH YOUR NAUSEA MEDICATION  *UNUSUAL SHORTNESS OF BREATH  *UNUSUAL BRUISING OR BLEEDING  TENDERNESS IN MOUTH AND THROAT WITH OR WITHOUT PRESENCE OF ULCERS  *URINARY PROBLEMS  *BOWEL PROBLEMS  UNUSUAL RASH Items with * indicate a potential emergency and should be followed up as soon as possible. If you have an emergency after office hours please contact your primary care physician or go to the nearest emergency department.  Please call the clinic during office hours if you have any questions or concerns.   You may also contact the Patient Navigator at [phone] should you have any questions or need assistance in obtaining follow up care. _____________________________________________________________________ Have you asked about our STAR program?    STAR stands for Survivorship Training and Rehabilitation, and this is a nationally recognized cancer care program that focuses on survivorship and rehabilitation.  Cancer and cancer treatments may cause problems, such as, pain, making you feel tired and keeping you from doing the things that you need or want to do. Cancer rehabilitation can help. Our goal is to reduce these troubling effects and help you have the best quality of life possible.  You may receive a survey from a nurse that asks questions about your current state of health.  Based on the survey results, all eligible patients will be referred to the [facility]  program for an evaluation so we can better serve you! A frequently asked questions sheet is available upon request.

## 2015-03-07 NOTE — Progress Notes (Signed)
1435:  Tolerated treatment w/o adverse reaction; a&ox4; VSS; discharged ambulatory.

## 2015-03-07 NOTE — Progress Notes (Signed)
[place] Clinical Social Work  Clinical Social Work was referred by [place] rounding for assessment of psychosocial needs due to ongoing financial concerns, emotional support and caregiver issues.  Clinical Social Worker met with patient at [facility] to offer support and assess for needs.  Pt shared ongoing stressors at home due to helping care for two elderly parents on top of his own health needs. He reports his mother is in need of upcoming surgery. CSW discussed how his mother may qualify for [facility] services at home and this may relieve some of his duties. He stated he would inquire about this at his mother's next appointment. He has appt at DSS in the am and plans to apply for food stamps. He called and was told he should qualify. CSW provided supportive listening and let him vent about his care giving concerns. He plans to follow up on [employer] as well and feels he may receive assistance through [employer] as well. Pt aware and agrees to reach to CSW as needed. CSW will continue to follow and check in.   Clinical Social Work interventions: Resource education Supportive listening  Loren Racer, [place] Tuesdays 8:30-1pm Wednesdays 8:30-12pm  [phone]

## 2015-03-08 ENCOUNTER — Telehealth (HOSPITAL_COMMUNITY): Payer: Self-pay | Admitting: *Deleted

## 2015-03-08 ENCOUNTER — Other Ambulatory Visit (HOSPITAL_COMMUNITY): Payer: Self-pay | Admitting: Oncology

## 2015-03-08 DIAGNOSIS — C719 Malignant neoplasm of brain, unspecified: Secondary | ICD-10-CM

## 2015-03-08 MED ORDER — PROCHLORPERAZINE MALEATE 10 MG PO TABS: 10.0000 mg | ORAL_TABLET | Freq: Four times a day (QID) | ORAL | Status: AC | PRN

## 2015-03-08 NOTE — Telephone Encounter (Signed)
Patient notified that compazine called to [facility] and he will try to get some OTC zantac. He will call me tomorrow if no better

## 2015-03-08 NOTE — Telephone Encounter (Signed)
Patient called complaining of stomach pain, that arises from "lower abdomen and goes up to esophagus" He states that it feels like acid and burns. Nausea associated with it. He has throw up once last night and 3 times today.  He took a pill that he let dissolve on his tongue, however when he read the name back to me it was dexamethasone. His bowels moved well yesterday after chemo, he is drinking 5-7 glasses of tea a day. I talked with him about calling him something for nausea and acid reflux. He states he can't afford both and is agreeable with [place] calling in something for nausea.

## 2015-03-21 ENCOUNTER — Encounter (HOSPITAL_COMMUNITY): Payer: Self-pay

## 2015-03-21 ENCOUNTER — Encounter (HOSPITAL_COMMUNITY): Payer: Medicaid Other | Attending: Hematology & Oncology

## 2015-03-21 ENCOUNTER — Encounter: Payer: Self-pay | Admitting: *Deleted

## 2015-03-21 ENCOUNTER — Encounter (HOSPITAL_BASED_OUTPATIENT_CLINIC_OR_DEPARTMENT_OTHER): Payer: Medicaid Other | Admitting: Oncology

## 2015-03-21 VITALS — BP 150/98 | HR 73 | Temp 97.6°F | Resp 20 | Wt 176.2 lb

## 2015-03-21 DIAGNOSIS — Z5112 Encounter for antineoplastic immunotherapy: Secondary | ICD-10-CM | POA: Diagnosis not present

## 2015-03-21 DIAGNOSIS — B372 Candidiasis of skin and nail: Secondary | ICD-10-CM | POA: Diagnosis not present

## 2015-03-21 DIAGNOSIS — Z5111 Encounter for antineoplastic chemotherapy: Secondary | ICD-10-CM

## 2015-03-21 DIAGNOSIS — C711 Malignant neoplasm of frontal lobe: Secondary | ICD-10-CM | POA: Diagnosis not present

## 2015-03-21 DIAGNOSIS — C719 Malignant neoplasm of brain, unspecified: Secondary | ICD-10-CM | POA: Diagnosis present

## 2015-03-21 LAB — URINALYSIS, DIPSTICK ONLY
Bilirubin Urine: NEGATIVE
GLUCOSE, UA: NEGATIVE mg/dL
Hgb urine dipstick: NEGATIVE
KETONES UR: NEGATIVE mg/dL
LEUKOCYTES UA: NEGATIVE
NITRITE: NEGATIVE
Protein, ur: NEGATIVE mg/dL
Specific Gravity, Urine: 1.01 (ref 1.005–1.030)
UROBILINOGEN UA: 0.2 mg/dL (ref 0.0–1.0)
pH: 6.5 (ref 5.0–8.0)

## 2015-03-21 LAB — COMPREHENSIVE METABOLIC PANEL
ALBUMIN: 3.7 g/dL (ref 3.5–5.2)
ALT: 20 U/L (ref 0–53)
AST: 20 U/L (ref 0–37)
Alkaline Phosphatase: 55 U/L (ref 39–117)
Anion gap: 8 (ref 5–15)
BUN: 12 mg/dL (ref 6–23)
CALCIUM: 8.7 mg/dL (ref 8.4–10.5)
CHLORIDE: 105 mmol/L (ref 96–112)
CO2: 25 mmol/L (ref 19–32)
CREATININE: 1.16 mg/dL (ref 0.50–1.35)
GFR calc Af Amer: 78 mL/min — ABNORMAL LOW (ref >90)
GFR calc non Af Amer: 68 mL/min — ABNORMAL LOW (ref >90)
Glucose, Bld: 85 mg/dL (ref 70–99)
Potassium: 3.9 mmol/L (ref 3.5–5.1)
Sodium: 138 mmol/L (ref 135–145)
Total Bilirubin: 0.8 mg/dL (ref 0.3–1.2)
Total Protein: 6.9 g/dL (ref 6.0–8.3)

## 2015-03-21 LAB — CBC WITH DIFFERENTIAL/PLATELET
BASOS ABS: 0 10*3/uL (ref 0.0–0.1)
BASOS PCT: 0 % (ref 0–1)
Eosinophils Absolute: 0.1 10*3/uL (ref 0.0–0.7)
Eosinophils Relative: 1 % (ref 0–5)
HCT: 43 % (ref 39.0–52.0)
Hemoglobin: 14.4 g/dL (ref 13.0–17.0)
Lymphocytes Relative: 16 % (ref 12–46)
Lymphs Abs: 1.5 10*3/uL (ref 0.7–4.0)
MCH: 29.2 pg (ref 26.0–34.0)
MCHC: 33.5 g/dL (ref 30.0–36.0)
MCV: 87.2 fL (ref 78.0–100.0)
Monocytes Absolute: 0.7 10*3/uL (ref 0.1–1.0)
Monocytes Relative: 7 % (ref 3–12)
NEUTROS ABS: 6.9 10*3/uL (ref 1.7–7.7)
Neutrophils Relative %: 76 % (ref 43–77)
PLATELETS: 242 10*3/uL (ref 150–400)
RBC: 4.93 MIL/uL (ref 4.22–5.81)
RDW: 15.7 % — AB (ref 11.5–15.5)
WBC: 9.2 10*3/uL (ref 4.0–10.5)

## 2015-03-21 MED ORDER — SODIUM CHLORIDE 0.9 % IV SOLN
Freq: Once | INTRAVENOUS | Status: AC
  Administered 2015-03-21: 10:00:00 via INTRAVENOUS

## 2015-03-21 MED ORDER — DEXTROSE 5 % IV SOLN
125.0000 mg/m2 | Freq: Once | INTRAVENOUS | Status: AC
  Administered 2015-03-21: 244 mg via INTRAVENOUS
  Filled 2015-03-21: qty 4.07

## 2015-03-21 MED ORDER — BEVACIZUMAB CHEMO INJECTION 400 MG/16ML
10.0000 mg/kg | Freq: Once | INTRAVENOUS | Status: AC
  Administered 2015-03-21: 775 mg via INTRAVENOUS
  Filled 2015-03-21: qty 31

## 2015-03-21 MED ORDER — HEPARIN SOD (PORK) LOCK FLUSH 100 UNIT/ML IV SOLN
500.0000 [IU] | Freq: Once | INTRAVENOUS | Status: DC | PRN
  Filled 2015-03-21: qty 5

## 2015-03-21 MED ORDER — SODIUM CHLORIDE 0.9 % IV SOLN
Freq: Once | INTRAVENOUS | Status: AC
  Administered 2015-03-21: 16 mg via INTRAVENOUS
  Filled 2015-03-21: qty 8

## 2015-03-21 MED ORDER — OXYCODONE HCL 5 MG PO CAPS: 5.0000 mg | ORAL_CAPSULE | ORAL | Status: DC | PRN

## 2015-03-21 MED ORDER — SODIUM CHLORIDE 0.9 % IJ SOLN
10.0000 mL | INTRAMUSCULAR | Status: DC | PRN
  Administered 2015-03-21: 10 mL
  Filled 2015-03-21: qty 10

## 2015-03-21 NOTE — Progress Notes (Signed)
Tolerated treatment w/o adverse reaction. VSS; a&ox4; in no distress. Discharged home ambulatory.

## 2015-03-21 NOTE — Progress Notes (Signed)
Cody Buchanan is seen as a work-in today for a groin "granulating tissue" he reports.   He notes that his left groin is causing him discomfort.  On exam, he has chaffing noted and possibly a component of fungal infection.  His primary care provider has prescribed him a cream consisting of nystatin and a steroid component.  I have encouraged Cody Buchanan to get this filled and use it as prescribed. He also has a left, lateral boil with a white head.  I have recommended warm compresses to this to bring it to a head.  He is to use OTC antibiotic ointment on this area.   I refilled his Oxycodone Rx today.  Return as as scheduled for follow-up.  Patient and plan discussed with Dr. Ancil Linsey and she is in agreement with the aforementioned.   KEFALAS,THOMAS 03/21/2015 9:47 AM

## 2015-03-21 NOTE — Progress Notes (Signed)
[place] Clinical Social Work  Clinical Social Work was referred by [place] rounding for assessment of psychosocial needs due to CSW following for emotional support and resource assistance.  Clinical Social Worker met with patient at [facility] to offer support and assess for needs.  Pt reports he has applied for food stamps and was approved for a small amount of $20 each month. He plans to appeal this as he continues to struggle financially. CSW has linked pt with several financial assistance options and is currently not aware of additional options to assist him at this time. CSW will continue to research possible options for assistance. Pt was allowed safe space today to vent his anxieties re. His treatment, diagnosis and caring for his elderly parents. CSW also linked pt with [facility] for [facility] to help relieve some anxiety today. Pt was appreciative and CSW to follow.   Clinical Social Work interventions: Emotional support Resource assistance  Loren Racer, [place] Tuesdays 8:30-1pm Wednesdays 8:30-12pm  [phone]

## 2015-03-21 NOTE — Patient Instructions (Signed)
[facility] Discharge Instructions for Patients Receiving Chemotherapy  Today you received the following chemotherapy agents:  Avastin and irinotecan  If you develop nausea and vomiting, or diarrhea that is not controlled by your medication, call the clinic.  The clinic phone number is [phone]. Office hours are Monday-Friday 8:30am-5:00pm.  BELOW ARE SYMPTOMS THAT SHOULD BE REPORTED IMMEDIATELY:  *FEVER GREATER THAN 101.0 F  *CHILLS WITH OR WITHOUT FEVER  NAUSEA AND VOMITING THAT IS NOT CONTROLLED WITH YOUR NAUSEA MEDICATION  *UNUSUAL SHORTNESS OF BREATH  *UNUSUAL BRUISING OR BLEEDING  TENDERNESS IN MOUTH AND THROAT WITH OR WITHOUT PRESENCE OF ULCERS  *URINARY PROBLEMS  *BOWEL PROBLEMS  UNUSUAL RASH Items with * indicate a potential emergency and should be followed up as soon as possible. If you have an emergency after office hours please contact your primary care physician or go to the nearest emergency department.  Please call the clinic during office hours if you have any questions or concerns.   You may also contact the Patient Navigator at [phone] should you have any questions or need assistance in obtaining follow up care. _____________________________________________________________________ Have you asked about our STAR program?    STAR stands for Survivorship Training and Rehabilitation, and this is a nationally recognized cancer care program that focuses on survivorship and rehabilitation.  Cancer and cancer treatments may cause problems, such as, pain, making you feel tired and keeping you from doing the things that you need or want to do. Cancer rehabilitation can help. Our goal is to reduce these troubling effects and help you have the best quality of life possible.  You may receive a survey from a nurse that asks questions about your current state of health.  Based on the survey results, all eligible patients will be referred to the [facility]  program for an evaluation so we can better serve you! A frequently asked questions sheet is available upon request.

## 2015-04-04 ENCOUNTER — Encounter (HOSPITAL_COMMUNITY): Payer: Self-pay

## 2015-04-04 ENCOUNTER — Encounter (HOSPITAL_COMMUNITY): Payer: Medicaid Other | Attending: Hematology and Oncology

## 2015-04-04 ENCOUNTER — Encounter: Payer: Self-pay | Admitting: *Deleted

## 2015-04-04 VITALS — BP 166/89 | HR 72 | Temp 98.3°F | Resp 20 | Wt 177.3 lb

## 2015-04-04 DIAGNOSIS — Z5111 Encounter for antineoplastic chemotherapy: Secondary | ICD-10-CM

## 2015-04-04 DIAGNOSIS — C711 Malignant neoplasm of frontal lobe: Secondary | ICD-10-CM | POA: Diagnosis not present

## 2015-04-04 DIAGNOSIS — C719 Malignant neoplasm of brain, unspecified: Secondary | ICD-10-CM

## 2015-04-04 DIAGNOSIS — Z5112 Encounter for antineoplastic immunotherapy: Secondary | ICD-10-CM

## 2015-04-04 LAB — CBC WITH DIFFERENTIAL/PLATELET
BASOS ABS: 0 10*3/uL (ref 0.0–0.1)
BASOS PCT: 0 % (ref 0–1)
EOS ABS: 0.1 10*3/uL (ref 0.0–0.7)
Eosinophils Relative: 1 % (ref 0–5)
HCT: 43.3 % (ref 39.0–52.0)
Hemoglobin: 14.3 g/dL (ref 13.0–17.0)
Lymphocytes Relative: 20 % (ref 12–46)
Lymphs Abs: 1.4 10*3/uL (ref 0.7–4.0)
MCH: 29.2 pg (ref 26.0–34.0)
MCHC: 33 g/dL (ref 30.0–36.0)
MCV: 88.4 fL (ref 78.0–100.0)
MONO ABS: 0.5 10*3/uL (ref 0.1–1.0)
Monocytes Relative: 7 % (ref 3–12)
NEUTROS ABS: 5.2 10*3/uL (ref 1.7–7.7)
Neutrophils Relative %: 72 % (ref 43–77)
Platelets: 234 10*3/uL (ref 150–400)
RBC: 4.9 MIL/uL (ref 4.22–5.81)
RDW: 15.8 % — AB (ref 11.5–15.5)
WBC: 7.2 10*3/uL (ref 4.0–10.5)

## 2015-04-04 LAB — URINALYSIS, DIPSTICK ONLY
Bilirubin Urine: NEGATIVE
Glucose, UA: NEGATIVE mg/dL
Hgb urine dipstick: NEGATIVE
KETONES UR: NEGATIVE mg/dL
LEUKOCYTES UA: NEGATIVE
Nitrite: NEGATIVE
PH: 5.5 (ref 5.0–8.0)
PROTEIN: NEGATIVE mg/dL
SPECIFIC GRAVITY, URINE: 1.025 (ref 1.005–1.030)
Urobilinogen, UA: 0.2 mg/dL (ref 0.0–1.0)

## 2015-04-04 LAB — COMPREHENSIVE METABOLIC PANEL
ALT: 18 U/L (ref 0–53)
AST: 19 U/L (ref 0–37)
Albumin: 3.8 g/dL (ref 3.5–5.2)
Alkaline Phosphatase: 63 U/L (ref 39–117)
Anion gap: 9 (ref 5–15)
BUN: 15 mg/dL (ref 6–23)
CALCIUM: 8.8 mg/dL (ref 8.4–10.5)
CO2: 23 mmol/L (ref 19–32)
Chloride: 106 mmol/L (ref 96–112)
Creatinine, Ser: 1.26 mg/dL (ref 0.50–1.35)
GFR calc Af Amer: 71 mL/min — ABNORMAL LOW (ref >90)
GFR calc non Af Amer: 61 mL/min — ABNORMAL LOW (ref >90)
Glucose, Bld: 94 mg/dL (ref 70–99)
Potassium: 3.6 mmol/L (ref 3.5–5.1)
Sodium: 138 mmol/L (ref 135–145)
TOTAL PROTEIN: 7 g/dL (ref 6.0–8.3)
Total Bilirubin: 0.7 mg/dL (ref 0.3–1.2)

## 2015-04-04 MED ORDER — SODIUM CHLORIDE 0.9 % IV SOLN
Freq: Once | INTRAVENOUS | Status: AC
  Administered 2015-04-04: 10:00:00 via INTRAVENOUS

## 2015-04-04 MED ORDER — SODIUM CHLORIDE 0.9 % IV SOLN
10.0000 mg/kg | Freq: Once | INTRAVENOUS | Status: AC
  Administered 2015-04-04: 775 mg via INTRAVENOUS
  Filled 2015-04-04: qty 31

## 2015-04-04 MED ORDER — SODIUM CHLORIDE 0.9 % IJ SOLN
10.0000 mL | INTRAMUSCULAR | Status: DC | PRN
  Administered 2015-04-04: 10 mL
  Filled 2015-04-04: qty 10

## 2015-04-04 MED ORDER — ONDANSETRON HCL 40 MG/20ML IJ SOLN
Freq: Once | INTRAMUSCULAR | Status: AC
  Administered 2015-04-04: 16 mg via INTRAVENOUS
  Filled 2015-04-04: qty 8

## 2015-04-04 MED ORDER — HEPARIN SOD (PORK) LOCK FLUSH 100 UNIT/ML IV SOLN
500.0000 [IU] | Freq: Once | INTRAVENOUS | Status: AC | PRN
Start: 2015-04-04 — End: 2015-04-04
  Administered 2015-04-04: 500 [IU]

## 2015-04-04 MED ORDER — HEPARIN SOD (PORK) LOCK FLUSH 100 UNIT/ML IV SOLN
INTRAVENOUS | Status: AC
  Filled 2015-04-04: qty 5

## 2015-04-04 MED ORDER — OXYCODONE HCL 5 MG PO CAPS: 5.0000 mg | ORAL_CAPSULE | ORAL | Status: DC | PRN

## 2015-04-04 MED ORDER — IRINOTECAN HCL CHEMO INJECTION 100 MG/5ML
125.0000 mg/m2 | Freq: Once | INTRAVENOUS | Status: AC
  Administered 2015-04-04: 244 mg via INTRAVENOUS
  Filled 2015-04-04: qty 4.07

## 2015-04-04 NOTE — Progress Notes (Signed)
[place] Clinical Social Work  Clinical Social Work was referred by [place] rounding for assessment of psychosocial needs due to ongoing financial concerns.  Clinical Social Worker met with patient at [facility] to offer support and assess for needs. Pt was in pretty good spirits today. He revisited DSS re. His food stamps and they think he can possibly get more a month once he brings in his medical bills. Being able to afford gas continues to be a struggle. Pt really is bent on continuing to drive. CSW has exhausted all resources at this point, as he can access assistance from many of programs only once a year. He is aware of how to access [employer] to bring him, but is not very interested in this resource. Pt appreciated supportive listening and time to vent. CSW will continue to follow and assist.    Clinical Social Work interventions: Resource assistance Education Emotional Support   Loren Racer, [place] Tuesdays 8:30-1pm Wednesdays 8:30-12pm  [phone]

## 2015-04-04 NOTE — Progress Notes (Signed)
1250:  Tolerated tx w/o adverse reaction; a&ox4; in no distress. Discharged ambulatory.

## 2015-04-04 NOTE — Patient Instructions (Signed)
[facility] Discharge Instructions for Patients Receiving Chemotherapy  Today you received the following chemotherapy agents:  Avastin and irinotecan.   If you develop nausea and vomiting, or diarrhea that is not controlled by your medication, call the clinic.  The clinic phone number is [phone]. Office hours are Monday-Friday 8:30am-5:00pm.  BELOW ARE SYMPTOMS THAT SHOULD BE REPORTED IMMEDIATELY:  *FEVER GREATER THAN 101.0 F  *CHILLS WITH OR WITHOUT FEVER  NAUSEA AND VOMITING THAT IS NOT CONTROLLED WITH YOUR NAUSEA MEDICATION  *UNUSUAL SHORTNESS OF BREATH  *UNUSUAL BRUISING OR BLEEDING  TENDERNESS IN MOUTH AND THROAT WITH OR WITHOUT PRESENCE OF ULCERS  *URINARY PROBLEMS  *BOWEL PROBLEMS  UNUSUAL RASH Items with * indicate a potential emergency and should be followed up as soon as possible. If you have an emergency after office hours please contact your primary care physician or go to the nearest emergency department.  Please call the clinic during office hours if you have any questions or concerns.   You may also contact the Patient Navigator at [phone] should you have any questions or need assistance in obtaining follow up care. _____________________________________________________________________ Have you asked about our STAR program?    STAR stands for Survivorship Training and Rehabilitation, and this is a nationally recognized cancer care program that focuses on survivorship and rehabilitation.  Cancer and cancer treatments may cause problems, such as, pain, making you feel tired and keeping you from doing the things that you need or want to do. Cancer rehabilitation can help. Our goal is to reduce these troubling effects and help you have the best quality of life possible.  You may receive a survey from a nurse that asks questions about your current state of health.  Based on the survey results, all eligible patients will be referred to the  [facility] program for an evaluation so we can better serve you! A frequently asked questions sheet is available upon request.

## 2015-04-18 ENCOUNTER — Encounter (HOSPITAL_BASED_OUTPATIENT_CLINIC_OR_DEPARTMENT_OTHER): Payer: Medicaid Other | Admitting: Oncology

## 2015-04-18 ENCOUNTER — Encounter (HOSPITAL_COMMUNITY): Payer: Medicaid Other | Attending: Hematology & Oncology

## 2015-04-18 VITALS — BP 144/74 | HR 67 | Temp 97.8°F | Resp 18 | Wt 174.6 lb

## 2015-04-18 DIAGNOSIS — R52 Pain, unspecified: Secondary | ICD-10-CM | POA: Diagnosis not present

## 2015-04-18 DIAGNOSIS — C719 Malignant neoplasm of brain, unspecified: Secondary | ICD-10-CM

## 2015-04-18 DIAGNOSIS — C711 Malignant neoplasm of frontal lobe: Secondary | ICD-10-CM | POA: Diagnosis present

## 2015-04-18 DIAGNOSIS — Z5111 Encounter for antineoplastic chemotherapy: Secondary | ICD-10-CM | POA: Diagnosis not present

## 2015-04-18 DIAGNOSIS — Z5112 Encounter for antineoplastic immunotherapy: Secondary | ICD-10-CM | POA: Diagnosis present

## 2015-04-18 LAB — COMPREHENSIVE METABOLIC PANEL
ALBUMIN: 3.8 g/dL (ref 3.5–5.2)
ALT: 18 U/L (ref 0–53)
ANION GAP: 8 (ref 5–15)
AST: 18 U/L (ref 0–37)
Alkaline Phosphatase: 63 U/L (ref 39–117)
BUN: 13 mg/dL (ref 6–23)
CALCIUM: 9 mg/dL (ref 8.4–10.5)
CO2: 25 mmol/L (ref 19–32)
Chloride: 106 mmol/L (ref 96–112)
Creatinine, Ser: 1.1 mg/dL (ref 0.50–1.35)
GFR calc Af Amer: 84 mL/min — ABNORMAL LOW (ref >90)
GFR, EST NON AFRICAN AMERICAN: 72 mL/min — AB (ref >90)
Glucose, Bld: 102 mg/dL — ABNORMAL HIGH (ref 70–99)
Potassium: 3.8 mmol/L (ref 3.5–5.1)
Sodium: 139 mmol/L (ref 135–145)
Total Bilirubin: 0.8 mg/dL (ref 0.3–1.2)
Total Protein: 7 g/dL (ref 6.0–8.3)

## 2015-04-18 LAB — URINALYSIS, DIPSTICK ONLY
BILIRUBIN URINE: NEGATIVE
GLUCOSE, UA: NEGATIVE mg/dL
HGB URINE DIPSTICK: NEGATIVE
Ketones, ur: NEGATIVE mg/dL
Leukocytes, UA: NEGATIVE
Nitrite: NEGATIVE
PROTEIN: NEGATIVE mg/dL
Specific Gravity, Urine: >1.03 — ABNORMAL HIGH (ref 1.005–1.030)
Urobilinogen, UA: 0.2 mg/dL (ref 0.0–1.0)
pH: 5.5 (ref 5.0–8.0)

## 2015-04-18 LAB — CBC WITH DIFFERENTIAL/PLATELET
Basophils Absolute: 0 10*3/uL (ref 0.0–0.1)
Basophils Relative: 0 % (ref 0–1)
Eosinophils Absolute: 0.1 10*3/uL (ref 0.0–0.7)
Eosinophils Relative: 1 % (ref 0–5)
HEMATOCRIT: 43 % (ref 39.0–52.0)
HEMOGLOBIN: 14.4 g/dL (ref 13.0–17.0)
Lymphocytes Relative: 18 % (ref 12–46)
Lymphs Abs: 1.3 10*3/uL (ref 0.7–4.0)
MCH: 29.7 pg (ref 26.0–34.0)
MCHC: 33.5 g/dL (ref 30.0–36.0)
MCV: 88.7 fL (ref 78.0–100.0)
MONO ABS: 0.5 10*3/uL (ref 0.1–1.0)
MONOS PCT: 6 % (ref 3–12)
Neutro Abs: 5.5 10*3/uL (ref 1.7–7.7)
Neutrophils Relative %: 75 % (ref 43–77)
Platelets: 204 10*3/uL (ref 150–400)
RBC: 4.85 MIL/uL (ref 4.22–5.81)
RDW: 15.6 % — ABNORMAL HIGH (ref 11.5–15.5)
WBC: 7.4 10*3/uL (ref 4.0–10.5)

## 2015-04-18 MED ORDER — HEPARIN SOD (PORK) LOCK FLUSH 100 UNIT/ML IV SOLN
500.0000 [IU] | Freq: Once | INTRAVENOUS | Status: AC | PRN
  Administered 2015-04-18: 500 [IU]

## 2015-04-18 MED ORDER — ALPRAZOLAM 1 MG PO TABS: 1.0000 mg | ORAL_TABLET | Freq: Three times a day (TID) | ORAL | Status: DC | PRN

## 2015-04-18 MED ORDER — SODIUM CHLORIDE 0.9 % IJ SOLN
10.0000 mL | INTRAMUSCULAR | Status: DC | PRN
  Administered 2015-04-18: 10 mL
  Filled 2015-04-18: qty 10

## 2015-04-18 MED ORDER — BEVACIZUMAB CHEMO INJECTION 400 MG/16ML
10.0000 mg/kg | Freq: Once | INTRAVENOUS | Status: AC
  Administered 2015-04-18: 775 mg via INTRAVENOUS
  Filled 2015-04-18: qty 31

## 2015-04-18 MED ORDER — OXYCODONE HCL 5 MG PO CAPS: 5.0000 mg | ORAL_CAPSULE | ORAL | Status: DC | PRN

## 2015-04-18 MED ORDER — ATROPINE SULFATE 1 MG/ML IJ SOLN
1.0000 mg | Freq: Once | INTRAMUSCULAR | Status: AC | PRN
  Administered 2015-04-18: 1 mg via INTRAVENOUS

## 2015-04-18 MED ORDER — HEPARIN SOD (PORK) LOCK FLUSH 100 UNIT/ML IV SOLN
INTRAVENOUS | Status: AC
  Filled 2015-04-18: qty 5

## 2015-04-18 MED ORDER — SODIUM CHLORIDE 0.9 % IV SOLN
Freq: Once | INTRAVENOUS | Status: AC
  Administered 2015-04-18: 16 mg via INTRAVENOUS
  Filled 2015-04-18: qty 8

## 2015-04-18 MED ORDER — LORAZEPAM 2 MG/ML IJ SOLN
INTRAMUSCULAR | Status: AC
  Filled 2015-04-18: qty 1

## 2015-04-18 MED ORDER — DIPHENHYDRAMINE HCL 50 MG/ML IJ SOLN
INTRAMUSCULAR | Status: AC
  Filled 2015-04-18: qty 1

## 2015-04-18 MED ORDER — LORAZEPAM 2 MG/ML IJ SOLN
1.0000 mg | Freq: Once | INTRAMUSCULAR | Status: AC
  Administered 2015-04-18: 1 mg via INTRAVENOUS

## 2015-04-18 MED ORDER — ATROPINE SULFATE 1 MG/ML IJ SOLN
INTRAMUSCULAR | Status: AC
  Filled 2015-04-18: qty 1

## 2015-04-18 MED ORDER — SODIUM CHLORIDE 0.9 % IV SOLN
Freq: Once | INTRAVENOUS | Status: AC
Start: 2015-04-18 — End: 2015-04-18
  Administered 2015-04-18: 09:00:00 via INTRAVENOUS

## 2015-04-18 MED ORDER — IRINOTECAN HCL CHEMO INJECTION 100 MG/5ML
125.0000 mg/m2 | Freq: Once | INTRAVENOUS | Status: AC
  Administered 2015-04-18: 244 mg via INTRAVENOUS
  Filled 2015-04-18: qty 12.2

## 2015-04-18 MED ORDER — DIPHENHYDRAMINE HCL 50 MG/ML IJ SOLN
25.0000 mg | Freq: Once | INTRAMUSCULAR | Status: AC
  Administered 2015-04-18: 25 mg via INTRAVENOUS

## 2015-04-18 NOTE — Patient Instructions (Signed)
..  [place] at [facility] Discharge Instructions  RECOMMENDATIONS MADE BY THE CONSULTANT AND ANY TEST RESULTS WILL BE SENT TO YOUR REFERRING PHYSICIAN.  Follow up at [facility] as scheduled Return to see [place] in 1 month  Return in 2 weks for chemo and 4 weeks chemo  Prescription given for xanax and oxycodone  Thank you for choosing [place] at [facility] to provide your oncology and hematology care.  To afford each patient quality time with our provider, please arrive at least 15 minutes before your scheduled appointment time.    You need to re-schedule your appointment should you arrive 10 or more minutes late.  We strive to give you quality time with our providers, and arriving late affects you and other patients whose appointments are after yours.  Also, if you no show three or more times for appointments you may be dismissed from the clinic at the providers discretion.     Again, thank you for choosing [facility].  Our hope is that these requests will decrease the amount of time that you wait before being seen by our physicians.       _____________________________________________________________  Should you have questions after your visit to [facility], please contact our office at [phone] between the hours of 8:30 a.m. and 4:30 p.m.  Voicemails left after 4:30 p.m. will not be returned until the following business day.  For prescription refill requests, have your pharmacy contact our office.

## 2015-04-18 NOTE — Patient Instructions (Signed)
[facility] Discharge Instructions for Patients Receiving Chemotherapy  Today you received the following chemotherapy agents Avastin and Irinotecan.  To help prevent nausea and vomiting after your treatment, we encourage you to take your nausea medication as instructed. If you develop nausea and vomiting that is not controlled by your nausea medication, call the clinic. If it is after clinic hours your family physician or the after hours number for the clinic or go to the Emergency Department. BELOW ARE SYMPTOMS THAT SHOULD BE REPORTED IMMEDIATELY:  *FEVER GREATER THAN 101.0 F  *CHILLS WITH OR WITHOUT FEVER  NAUSEA AND VOMITING THAT IS NOT CONTROLLED WITH YOUR NAUSEA MEDICATION  *UNUSUAL SHORTNESS OF BREATH  *UNUSUAL BRUISING OR BLEEDING  TENDERNESS IN MOUTH AND THROAT WITH OR WITHOUT PRESENCE OF ULCERS  *URINARY PROBLEMS  *BOWEL PROBLEMS  UNUSUAL RASH Items with * indicate a potential emergency and should be followed up as soon as possible.  Return as scheduled.  I have been informed and understand all the instructions given to me. I know to contact the clinic, my physician, or go to the Emergency Department if any problems should occur. I do not have any questions at this time, but understand that I may call the clinic during office hours or the Patient Navigator at [phone] should I have any questions or need assistance in obtaining follow up care.    __________________________________________  _____________  __________ Signature of Patient or Authorized Representative            Date                   Time    __________________________________________ Nurse's Signature

## 2015-04-18 NOTE — Progress Notes (Signed)
Cody Hampshire, MD [place] [address]  Malignant brain tumor, left frontal glioblastoma multiforme grade 4 - Plan: ALPRAZolam (XANAX) 1 MG tablet, oxycodone (OXY-IR) 5 MG capsule  CURRENT THERAPY: Irinotecan + Avastin  INTERVAL HISTORY: Cody Buchanan 59 y.o. male returns for followup of gliobastoma.  Oncology History   03/03/2014 Partial resection, Cody Buchanan, [place], Dr. Rene Paci Buchanan 04/07/2014 Re-resection at [place], Dr. Yetta Buchanan     Malignant brain tumor, left frontal glioblastoma multiforme grade 4   03/03/2014 Initial Diagnosis Malignant brain tumor, left frontal glioblastoma multiforme grade 4, [place], [place]. partial resection, Dr. Rene Paci Buchanan   04/07/2014 Surgery Re-resection at [place], Dr. Derrill Buchanan   05/10/2014 - 06/21/2014 Radiation Therapy By Dr. Sondra Buchanan with 6000 cGy in 30 fractions to left frontal brain   07/07/2014 -  Chemotherapy Temodar x5 days every 4 weeks for 2 cycles +2 infusions of patient's own lymphocytes.   08/23/2014 Progression MRI showed progression at [facility]   09/27/2014 -  Chemotherapy Avastin started with irinotecan added on 10/11/2014    12/06/2014 Imaging MRI brain at [facility]- Decreased enhancement, edema, and mass effect. This may represent pseudoresponse related to interval chemotherapy versus true response.   02/07/2015 Imaging MRI brain at [facility]- Increasing nodular enhancement along the medial aspect of the left frontalresection cavity.   04/04/2015 Imaging MRI brain at [facility]- Slight interval increase in nodular enhancement within the genu of thecorpus callosum and increased size of a nodular focus of enhancement medialto the resection cavity.      I personally reviewed and went over laboratory results with the patient.  The results are noted within this dictation.  I personally reviewed and went over radiographic studies with the patient.  The results are noted within this dictation.     He reports that he is feeling well.  He has a small right sided boil that is rising to the surface.  It does not appear to be infectious at this time.  I recommended warm compresses to this area.  If it opens, I recommend antibiotic ointment.  Oncologically, he denies any complaints and ROS questioning is negative.   He denies any neurologic complaints as well, except for chronic headaches.  Past Medical History  Diagnosis Date  . Glioblastoma   . GERD (gastroesophageal reflux disease)   . Seizures   . Anxiety 09/07/2014    has Malignant brain tumor, left frontal glioblastoma multiforme grade 4; Chronic obstructive pulmonary disease; Seizure disorder; Allergic rhinitis; Acute gouty arthritis; and Anxiety on his problem list.     has No Known Allergies.  Cody Buchanan had no medications administered during this visit.  Past Surgical History  Procedure Laterality Date  . Salivary gland surgery    . Brain tumor excision      Denies any headaches, dizziness, double vision, fevers, chills, night sweats, nausea, vomiting, diarrhea, constipation, chest pain, heart palpitations, shortness of breath, blood in stool, black tarry stool, urinary pain, urinary burning, urinary frequency, hematuria.   PHYSICAL EXAMINATION  ECOG PERFORMANCE STATUS: 1 - Symptomatic but completely ambulatory  There were no vitals filed for this visit.  GENERAL:alert, no distress, well nourished, well developed, comfortable, cooperative and smiling SKIN: skin color, texture, turgor are normal, no rashes or significant lesions HEAD: Normocephalic, No masses, lesions, tenderness or abnormalities EYES: normal, PERRLA, EOMI, Conjunctiva are pink and non-injected EARS: External ears normal OROPHARYNX:lips, buccal mucosa, and tongue normal and mucous membranes are  moist  NECK: supple, no adenopathy, thyroid normal size, non-tender, without nodularity, no stridor, non-tender, trachea midline LYMPH:  no palpable  lymphadenopathy BREAST:not examined LUNGS: clear to auscultation  HEART: regular rate & rhythm ABDOMEN:abdomen soft, non-tender and normal bowel sounds BACK: Back symmetric, no curvature., No CVA tenderness EXTREMITIES:less then 2 second capillary refill, no joint deformities, effusion, or inflammation, no skin discoloration, no cyanosis  NEURO: alert & oriented x 3 with fluent speech, no focal motor/sensory deficits, gait normal   LABORATORY DATA: CBC    Component Value Date/Time   WBC 7.4 04/18/2015 0830   RBC 4.85 04/18/2015 0830   HGB 14.4 04/18/2015 0830   HCT 43.0 04/18/2015 0830   PLT 204 04/18/2015 0830   MCV 88.7 04/18/2015 0830   MCH 29.7 04/18/2015 0830   MCHC 33.5 04/18/2015 0830   RDW 15.6* 04/18/2015 0830   LYMPHSABS 1.3 04/18/2015 0830   MONOABS 0.5 04/18/2015 0830   EOSABS 0.1 04/18/2015 0830   BASOSABS 0.0 04/18/2015 0830      Chemistry      Component Value Date/Time   NA 138 04/04/2015 0915   K 3.6 04/04/2015 0915   CL 106 04/04/2015 0915   CO2 23 04/04/2015 0915   BUN 15 04/04/2015 0915   CREATININE 1.26 04/04/2015 0915      Component Value Date/Time   CALCIUM 8.8 04/04/2015 0915   ALKPHOS 63 04/04/2015 0915   AST 19 04/04/2015 0915   ALT 18 04/04/2015 0915   BILITOT 0.7 04/04/2015 0915        ASSESSMENT AND PLAN:  Malignant brain tumor, left frontal glioblastoma multiforme grade 29 59 year old male with glioblastoma, followed at [facility], currently on Irinotecan + Avastin with control of disease.   He is having a lot of "social" issues and financial issues which are currently his biggest struggles.  Pre-chemotherapy labs today as ordered.  Treatment today as planned if parameters are met.  Refill on pain medication and Xanax.    Follow-up at [facility] on 04/30/2015 for follow and repeat imaging.  Return in 1 month for follow-up.      THERAPY PLAN: Returns to [facility] for repeat imaging and follow-up on 04/30/2015  All  questions were answered. The patient knows to call the clinic with any problems, questions or concerns. We can certainly see the patient much sooner if necessary.  Patient and plan discussed with Dr. Ancil Linsey and she is in agreement with the aforementioned.   This note is electronically signed by: Robynn Pane 04/18/2015 9:05 AM

## 2015-04-18 NOTE — Assessment & Plan Note (Addendum)
59 year old male with glioblastoma, followed at [facility], currently on Irinotecan + Avastin with control of disease.   He is having a lot of "social" issues and financial issues which are currently his biggest struggles.  Pre-chemotherapy labs today as ordered.  Treatment today as planned if parameters are met.  Refill on pain medication and Xanax.    Follow-up at [facility] on 04/30/2015 for follow and repeat imaging.  Return in 1 month for follow-up.

## 2015-04-18 NOTE — Progress Notes (Signed)
Tolerated chemo well. 

## 2015-05-01 ENCOUNTER — Other Ambulatory Visit (HOSPITAL_COMMUNITY): Payer: Self-pay | Admitting: Oncology

## 2015-05-01 ENCOUNTER — Inpatient Hospital Stay (HOSPITAL_COMMUNITY): Payer: Medicaid Other

## 2015-05-01 NOTE — Patient Instructions (Addendum)
[place]   CHEMOTHERAPY INSTRUCTIONS  Premeds: Zofran & Dexamethasone (takes 30 minutes to infuse)  Carboplatin - this medication can be hard on your kidneys - this is why we need you to drink 64 oz of fluid (preferably water/decaff fluids) 2 days prior to chemo and for up to 4-5 days after chemo. Drink more if you can. This will help to keep your kidneys flushed. This can cause mild hair loss, lower your platelets (which make your blood clot), lower your white blood cells (fight infection), and cause nausea/vomiting. (takes 30 minutes to infuse)  Avastin - this medication is considered an anti-angiogenic therapy. Avastin is thought to work by causing the blood vessels to shrink away from the tumor, blocking the supply of oxygen and nutrients that the tumor needs to grow. Avastin may also cause the existing blood vessels to change in ways that help the chemotherapy reach the tumor more effectively. Avastin may also work to interfere with the growth of new blood vessels, causing the tumor to starve. Side Effects: Nosebleeds, High Blood Pressure, Protein in Urine, & diarrhea.   You will receive these medications every 14 days.   SELF CARE ACTIVITIES WHILE ON CHEMOTHERAPY: Increase your fluid intake 48 hours prior to treatment and drink at least 2 quarts per day after treatment., No alcohol intake., No aspirin or other medications unless approved by your oncologist., Eat foods that are light and easy to digest., Eat foods at cold or room temperature., No fried, fatty, or spicy foods immediately before or after treatment., Have teeth cleaned professionally before starting treatment. Keep dentures and partial plates clean., Use soft toothbrush and do not use mouthwashes that contain alcohol. Biotene is a good mouthwash that is available at most pharmacies or may be ordered by calling [phone]., Use warm salt water gargles (1 teaspoon salt per 1 quart warm water)  before and after meals and at bedtime. Or you may rinse with 2 tablespoons of three -percent hydrogen peroxide mixed in eight ounces of water., Always use sunscreen with SPF (Sun Protection Factor) of 30 or higher., Use your nausea medication as directed to prevent nausea., Use your stool softener or laxative as directed to prevent constipation. and Use your anti-diarrheal medication as directed to stop diarrhea.  Please wash your hands for at least 30 seconds using warm soapy water. Handwashing is the #1 way to prevent the spread of germs. Stay away from sick people or people who are getting over a cold. If you develop respiratory systems such as green/yellow mucus production or productive cough or persistent cough let us know and we will see if you need an antibiotic. It is a good idea to keep a pair of gloves on when going into grocery stores/[employer] to decrease your risk of coming into contact with germs on the carts, etc. Carry alcohol hand gel with you at all times and use it frequently if out in public. All foods need to be cooked thoroughly. No raw foods. No medium or undercooked meats, eggs. If your food is cooked medium well, it does not need to be hot pink or saturated with bloody liquid at all. Vegetables and fruits need to be washed/rinsed under the faucet with a dish detergent before being consumed. You can eat raw fruits and vegetables unless we tell you otherwise but it would be best if you cooked them or bought frozen. Do not eat off of salad bars or hot bars unless you really trust  the cleanliness of the restaurant. If you need dental work, please let Dr. Whitney Muse know before you go for your appointment so that we can coordinate the best possible time for you in regards to your chemo regimen. You need to also let your dentist know that you are actively taking chemo. We may need to do labs prior to your dental appointment. We also want your bowels moving at least every other day. If this is not  happening, we need to know so that we can get you on a bowel regimen to help you go.    MEDICATIONS: You have been given prescriptions for the following medications:  Zofran 8mg  tablet. Take 1 tablet every 8 hours as needed for nausea/vomiting.   Compazine 10mg  tablet. Take 1 tablet every 6 hours as needed for nausea/vomiting.    Over-the-Counter Meds:  Senna - this is a mild laxative used to treat mild constipation. May take 4 tabs by mouth daily or up to twice a day as needed for mild constipation.  Milk of Magnesia - this is a laxative used to treat moderate to severe constipation. May take 2-4 tablespoons every 8 hours as needed. May increase to 8 tablespoons x 1 dose and if no bowel movement call the [facility].  Imodium - this is for diarrhea. Take 2 tabs after 1st loose stool and then 1 tab every 2 hours until you go a total of 12 hours without a loose stool. Call [facility] if loose stools continue.   SYMPTOMS TO REPORT AS SOON AS POSSIBLE AFTER TREATMENT:  FEVER GREATER THAN 100.5 F  CHILLS WITH OR WITHOUT FEVER  NAUSEA AND VOMITING THAT IS NOT CONTROLLED WITH YOUR NAUSEA MEDICATION  UNUSUAL SHORTNESS OF BREATH  UNUSUAL BRUISING OR BLEEDING  TENDERNESS IN MOUTH AND THROAT WITH OR WITHOUT PRESENCE OF ULCERS  URINARY PROBLEMS  BOWEL PROBLEMS  UNUSUAL RASH    Wear comfortable clothing and clothing appropriate for easy access to any Portacath or PICC line. Let us know if there is anything that we can do to make your therapy better!      I have been informed and understand all of the instructions given to me and have received a copy. I have been instructed to call the clinic [phone] or my family physician as soon as possible for continued medical care, if indicated. I do not have any more questions at this time but understand that I may call the [facility] or the Patient Navigator at [phone] during office hours should I have questions or need  assistance in obtaining follow-up care.           Carboplatin injection What is this medicine? CARBOPLATIN (KAR boe pla tin) is a chemotherapy drug. It targets fast dividing cells, like cancer cells, and causes these cells to die. This medicine is used to treat ovarian cancer and many other cancers. This medicine may be used for other purposes; ask your health care provider or pharmacist if you have questions. COMMON BRAND NAME(S): Paraplatin What should I tell my health care provider before I take this medicine? They need to know if you have any of these conditions: -blood disorders -hearing problems -kidney disease -recent or ongoing radiation therapy -an unusual or allergic reaction to carboplatin, cisplatin, other chemotherapy, other medicines, foods, dyes, or preservatives -pregnant or trying to get pregnant -breast-feeding How should I use this medicine? This drug is usually given as an infusion into a vein. It is administered in a  hospital or clinic by a specially trained health care professional. Talk to your pediatrician regarding the use of this medicine in children. Special care may be needed. Overdosage: If you think you have taken too much of this medicine contact a poison control center or emergency room at once. NOTE: This medicine is only for you. Do not share this medicine with others. What if I miss a dose? It is important not to miss a dose. Call your doctor or health care professional if you are unable to keep an appointment. What may interact with this medicine? -medicines for seizures -medicines to increase blood counts like filgrastim, pegfilgrastim, sargramostim -some antibiotics like amikacin, gentamicin, neomycin, streptomycin, tobramycin -vaccines Talk to your doctor or health care professional before taking any of these medicines: -acetaminophen -aspirin -ibuprofen -ketoprofen -naproxen This list may not describe all possible interactions. Give  your health care provider a list of all the medicines, herbs, non-prescription drugs, or dietary supplements you use. Also tell them if you smoke, drink alcohol, or use illegal drugs. Some items may interact with your medicine. What should I watch for while using this medicine? Your condition will be monitored carefully while you are receiving this medicine. You will need important blood work done while you are taking this medicine. This drug may make you feel generally unwell. This is not uncommon, as chemotherapy can affect healthy cells as well as cancer cells. Report any side effects. Continue your course of treatment even though you feel ill unless your doctor tells you to stop. In some cases, you may be given additional medicines to help with side effects. Follow all directions for their use. Call your doctor or health care professional for advice if you get a fever, chills or sore throat, or other symptoms of a cold or flu. Do not treat yourself. This drug decreases your body's ability to fight infections. Try to avoid being around people who are sick. This medicine may increase your risk to bruise or bleed. Call your doctor or health care professional if you notice any unusual bleeding. Be careful brushing and flossing your teeth or using a toothpick because you may get an infection or bleed more easily. If you have any dental work done, tell your dentist you are receiving this medicine. Avoid taking products that contain aspirin, acetaminophen, ibuprofen, naproxen, or ketoprofen unless instructed by your doctor. These medicines may hide a fever. Do not become pregnant while taking this medicine. Women should inform their doctor if they wish to become pregnant or think they might be pregnant. There is a potential for serious side effects to an unborn child. Talk to your health care professional or pharmacist for more information. Do not breast-feed an infant while taking this medicine. What side  effects may I notice from receiving this medicine? Side effects that you should report to your doctor or health care professional as soon as possible: -allergic reactions like skin rash, itching or hives, swelling of the face, lips, or tongue -signs of infection - fever or chills, cough, sore throat, pain or difficulty passing urine -signs of decreased platelets or bleeding - bruising, pinpoint red spots on the skin, black, tarry stools, nosebleeds -signs of decreased red blood cells - unusually weak or tired, fainting spells, lightheadedness -breathing problems -changes in hearing -changes in vision -chest pain -high blood pressure -low blood counts - This drug may decrease the number of white blood cells, red blood cells and platelets. You may be at increased risk for infections and  bleeding. -nausea and vomiting -pain, swelling, redness or irritation at the injection site -pain, tingling, numbness in the hands or feet -problems with balance, talking, walking -trouble passing urine or change in the amount of urine Side effects that usually do not require medical attention (report to your doctor or health care professional if they continue or are bothersome): -hair loss -loss of appetite -metallic taste in the mouth or changes in taste This list may not describe all possible side effects. Call your doctor for medical advice about side effects. You may report side effects to FDA at 1-800-FDA-1088. Where should I keep my medicine? This drug is given in a hospital or clinic and will not be stored at home. NOTE: This sheet is a summary. It may not cover all possible information. If you have questions about this medicine, talk to your doctor, pharmacist, or health care provider.  2015, Elsevier/Gold Standard. (2008-03-21 14:38:05) Bevacizumab injection What is this medicine? BEVACIZUMAB (be va SIZ yoo mab) is a chemotherapy drug. It targets a protein found in many cancer cell types, and  halts cancer growth. This drug treats many cancers including non-small cell lung cancer, ovarian cancer, cervical cancer, and colon or rectal cancer. It is usually given with other chemotherapy drugs. This medicine may be used for other purposes; ask your health care provider or pharmacist if you have questions. COMMON BRAND NAME(S): Avastin What should I tell my health care provider before I take this medicine? They need to know if you have any of these conditions: -blood clots -heart disease, including heart failure, heart attack, or chest pain (angina) -high blood pressure -infection (especially a virus infection such as chickenpox, cold sores, or herpes) -kidney disease -lung disease -prior chemotherapy with doxorubicin, daunorubicin, epirubicin, or other anthracycline type chemotherapy agents -recent or ongoing radiation therapy -recent surgery -stroke -an unusual or allergic reaction to bevacizumab, hamster proteins, mouse proteins, other medicines, foods, dyes, or preservatives -pregnant or trying to get pregnant -breast-feeding How should I use this medicine? This medicine is for infusion into a vein. It is given by a health care professional in a hospital or clinic setting. Talk to your pediatrician regarding the use of this medicine in children. Special care may be needed. Overdosage: If you think you have taken too much of this medicine contact a poison control center or emergency room at once. NOTE: This medicine is only for you. Do not share this medicine with others. What if I miss a dose? It is important not to miss your dose. Call your doctor or health care professional if you are unable to keep an appointment. What may interact with this medicine? Interactions are not expected. This list may not describe all possible interactions. Give your health care provider a list of all the medicines, herbs, non-prescription drugs, or dietary supplements you use. Also tell them if you  smoke, drink alcohol, or use illegal drugs. Some items may interact with your medicine. What should I watch for while using this medicine? Your condition will be monitored carefully while you are receiving this medicine. You will need important blood work and urine testing done while you are taking this medicine. During your treatment, let your health care professional know if you have any unusual symptoms, such as difficulty breathing. This medicine may rarely cause 'gastrointestinal perforation' (holes in the stomach, intestines or colon), a serious side effect requiring surgery to repair. This medicine should be started at least 28 days following major surgery and the site of the  surgery should be totally healed. Check with your doctor before scheduling dental work or surgery while you are receiving this treatment. Talk to your doctor if you have recently had surgery or if you have a wound that has not healed. Do not become pregnant while taking this medicine. Women should inform their doctor if they wish to become pregnant or think they might be pregnant. There is a potential for serious side effects to an unborn child. Talk to your health care professional or pharmacist for more information. Do not breast-feed an infant while taking this medicine. This medicine has caused ovarian failure in some women. This medicine may interfere with the ability to have a child. You should talk to your doctor or health care professional if you are concerned about your fertility. What side effects may I notice from receiving this medicine? Side effects that you should report to your doctor or health care professional as soon as possible: -allergic reactions like skin rash, itching or hives, swelling of the face, lips, or tongue -signs of infection - fever or chills, cough, sore throat, pain or trouble passing urine -signs of decreased platelets or bleeding - bruising, pinpoint red spots on the skin, black, tarry  stools, nosebleeds, blood in the urine -breathing problems -changes in vision -chest pain -confusion -jaw pain, especially after dental work -mouth sores -seizures -severe abdominal pain -severe headache -sudden numbness or weakness of the face, arm or leg -swelling of legs or ankles -symptoms of a stroke: change in mental awareness, inability to talk or move one side of the body (especially in patients with lung cancer) -trouble passing urine or change in the amount of urine -trouble speaking or understanding -trouble walking, dizziness, loss of balance or coordination Side effects that usually do not require medical attention (report to your doctor or health care professional if they continue or are bothersome): -constipation -diarrhea -dry skin -headache -loss of appetite -nausea, vomiting This list may not describe all possible side effects. Call your doctor for medical advice about side effects. You may report side effects to FDA at 1-800-FDA-1088. Where should I keep my medicine? This drug is given in a hospital or clinic and will not be stored at home. NOTE: This sheet is a summary. It may not cover all possible information. If you have questions about this medicine, talk to your doctor, pharmacist, or health care provider.  2015, Elsevier/Gold Standard. (2013-11-15 11:38:34)

## 2015-05-02 ENCOUNTER — Other Ambulatory Visit (HOSPITAL_COMMUNITY): Payer: Self-pay | Admitting: Hematology & Oncology

## 2015-05-02 ENCOUNTER — Encounter (HOSPITAL_COMMUNITY): Payer: Medicaid Other

## 2015-05-02 MED ORDER — ALPRAZOLAM 1 MG PO TABS: 1.0000 mg | ORAL_TABLET | Freq: Three times a day (TID) | ORAL | Status: DC | PRN

## 2015-05-02 MED ORDER — OXYCODONE HCL 5 MG PO CAPS: 5.0000 mg | ORAL_CAPSULE | ORAL | Status: DC | PRN

## 2015-05-02 MED ORDER — LEVETIRACETAM 1000 MG PO TABS: 1000.0000 mg | ORAL_TABLET | Freq: Two times a day (BID) | ORAL | Status: DC

## 2015-05-04 ENCOUNTER — Inpatient Hospital Stay (HOSPITAL_COMMUNITY): Payer: Medicaid Other

## 2015-05-09 ENCOUNTER — Encounter (HOSPITAL_COMMUNITY): Payer: Self-pay

## 2015-05-09 ENCOUNTER — Encounter (HOSPITAL_COMMUNITY): Payer: Medicaid Other

## 2015-05-09 ENCOUNTER — Encounter (HOSPITAL_COMMUNITY): Payer: Medicaid Other | Attending: Hematology and Oncology

## 2015-05-09 VITALS — BP 144/95 | HR 69 | Temp 98.0°F | Resp 20 | Wt 170.2 lb

## 2015-05-09 DIAGNOSIS — C719 Malignant neoplasm of brain, unspecified: Secondary | ICD-10-CM | POA: Insufficient documentation

## 2015-05-09 DIAGNOSIS — Z5112 Encounter for antineoplastic immunotherapy: Secondary | ICD-10-CM | POA: Diagnosis not present

## 2015-05-09 DIAGNOSIS — C712 Malignant neoplasm of temporal lobe: Secondary | ICD-10-CM

## 2015-05-09 DIAGNOSIS — Z5111 Encounter for antineoplastic chemotherapy: Secondary | ICD-10-CM

## 2015-05-09 LAB — BASIC METABOLIC PANEL
Anion gap: 6 (ref 5–15)
BUN: 7 mg/dL (ref 6–20)
CO2: 26 mmol/L (ref 22–32)
Calcium: 8.5 mg/dL — ABNORMAL LOW (ref 8.9–10.3)
Chloride: 103 mmol/L (ref 101–111)
Creatinine, Ser: 1.34 mg/dL — ABNORMAL HIGH (ref 0.61–1.24)
GFR calc Af Amer: >60 mL/min (ref >60)
GFR, EST NON AFRICAN AMERICAN: 57 mL/min — AB (ref >60)
GLUCOSE: 97 mg/dL (ref 70–99)
POTASSIUM: 3.7 mmol/L (ref 3.5–5.1)
Sodium: 135 mmol/L (ref 135–145)

## 2015-05-09 LAB — CBC WITH DIFFERENTIAL/PLATELET
Basophils Absolute: 0.1 10*3/uL (ref 0.0–0.1)
Basophils Relative: 1 % (ref 0–1)
EOS PCT: 8 % — AB (ref 0–5)
Eosinophils Absolute: 0.8 10*3/uL — ABNORMAL HIGH (ref 0.0–0.7)
HEMATOCRIT: 42.4 % (ref 39.0–52.0)
HEMOGLOBIN: 13.9 g/dL (ref 13.0–17.0)
LYMPHS PCT: 13 % (ref 12–46)
Lymphs Abs: 1.4 10*3/uL (ref 0.7–4.0)
MCH: 29 pg (ref 26.0–34.0)
MCHC: 32.8 g/dL (ref 30.0–36.0)
MCV: 88.3 fL (ref 78.0–100.0)
MONO ABS: 0.8 10*3/uL (ref 0.1–1.0)
MONOS PCT: 7 % (ref 3–12)
Neutro Abs: 7.7 10*3/uL (ref 1.7–7.7)
Neutrophils Relative %: 71 % (ref 43–77)
Platelets: 336 10*3/uL (ref 150–400)
RBC: 4.8 MIL/uL (ref 4.22–5.81)
RDW: 15.7 % — ABNORMAL HIGH (ref 11.5–15.5)
WBC: 10.7 10*3/uL — ABNORMAL HIGH (ref 4.0–10.5)

## 2015-05-09 LAB — URINALYSIS, ROUTINE W REFLEX MICROSCOPIC
Bilirubin Urine: NEGATIVE
Glucose, UA: NEGATIVE mg/dL
HGB URINE DIPSTICK: NEGATIVE
Ketones, ur: NEGATIVE mg/dL
Leukocytes, UA: NEGATIVE
Nitrite: NEGATIVE
Protein, ur: NEGATIVE mg/dL
SPECIFIC GRAVITY, URINE: 1.01 (ref 1.005–1.030)
Urobilinogen, UA: 0.2 mg/dL (ref 0.0–1.0)
pH: 6 (ref 5.0–8.0)

## 2015-05-09 MED ORDER — HEPARIN SOD (PORK) LOCK FLUSH 100 UNIT/ML IV SOLN
INTRAVENOUS | Status: AC
  Filled 2015-05-09: qty 5

## 2015-05-09 MED ORDER — SODIUM CHLORIDE 0.9 % IV SOLN
Freq: Once | INTRAVENOUS | Status: AC
  Administered 2015-05-09: 12:00:00 via INTRAVENOUS

## 2015-05-09 MED ORDER — HEPARIN SOD (PORK) LOCK FLUSH 100 UNIT/ML IV SOLN
500.0000 [IU] | Freq: Once | INTRAVENOUS | Status: AC | PRN
  Administered 2015-05-09: 500 [IU]

## 2015-05-09 MED ORDER — SODIUM CHLORIDE 0.9 % IV SOLN
10.0000 mg/kg | Freq: Once | INTRAVENOUS | Status: AC
  Administered 2015-05-09: 800 mg via INTRAVENOUS
  Filled 2015-05-09: qty 32

## 2015-05-09 MED ORDER — SODIUM CHLORIDE 0.9 % IJ SOLN: 10.0000 mL | INTRAMUSCULAR | Status: DC | PRN

## 2015-05-09 MED ORDER — CARBOPLATIN CHEMO INJECTION 450 MG/45ML
369.2000 mg | Freq: Once | INTRAVENOUS | Status: AC
  Administered 2015-05-09: 370 mg via INTRAVENOUS
  Filled 2015-05-09: qty 37

## 2015-05-09 MED ORDER — SODIUM CHLORIDE 0.9 % IV SOLN: Freq: Once | INTRAVENOUS | Status: DC

## 2015-05-09 MED ORDER — SODIUM CHLORIDE 0.9 % IV SOLN
Freq: Once | INTRAVENOUS | Status: AC
  Administered 2015-05-09 (×2): via INTRAVENOUS
  Filled 2015-05-09: qty 8

## 2015-05-09 NOTE — Patient Instructions (Signed)
[facility] Discharge Instructions for Patients Receiving Chemotherapy  Today you received the following chemotherapy agents carboplatin and avastin Please follow up as scheduled Call the clinic if you have any questions or concerns  To help prevent nausea and vomiting after your treatment, we encourage you to take your nausea medication  If you develop nausea and vomiting, or diarrhea that is not controlled by your medication, call the clinic.  The clinic phone number is [phone]. Office hours are Monday-Friday 8:30am-5:00pm.  BELOW ARE SYMPTOMS THAT SHOULD BE REPORTED IMMEDIATELY:  *FEVER GREATER THAN 101.0 F  *CHILLS WITH OR WITHOUT FEVER  NAUSEA AND VOMITING THAT IS NOT CONTROLLED WITH YOUR NAUSEA MEDICATION  *UNUSUAL SHORTNESS OF BREATH  *UNUSUAL BRUISING OR BLEEDING  TENDERNESS IN MOUTH AND THROAT WITH OR WITHOUT PRESENCE OF ULCERS  *URINARY PROBLEMS  *BOWEL PROBLEMS  UNUSUAL RASH Items with * indicate a potential emergency and should be followed up as soon as possible. If you have an emergency after office hours please contact your primary care physician or go to the nearest emergency department.  Please call the clinic during office hours if you have any questions or concerns.   You may also contact the Patient Navigator at [phone] should you have any questions or need assistance in obtaining follow up care. _____________________________________________________________________ Have you asked about our STAR program?    STAR stands for Survivorship Training and Rehabilitation, and this is a nationally recognized cancer care program that focuses on survivorship and rehabilitation.  Cancer and cancer treatments may cause problems, such as, pain, making you feel tired and keeping you from doing the things that you need or want to do. Cancer rehabilitation can help. Our goal is to reduce these troubling effects and help you have the best quality of  life possible.  You may receive a survey from a nurse that asks questions about your current state of health.  Based on the survey results, all eligible patients will be referred to the [facility] program for an evaluation so we can better serve you! A frequently asked questions sheet is available upon request.

## 2015-05-09 NOTE — Progress Notes (Signed)
[place] chemotherapy well today Discharged ambulatory

## 2015-05-16 ENCOUNTER — Encounter (HOSPITAL_COMMUNITY): Payer: Medicaid Other | Attending: Hematology & Oncology

## 2015-05-16 ENCOUNTER — Encounter (HOSPITAL_COMMUNITY): Payer: Self-pay | Admitting: Hematology & Oncology

## 2015-05-16 ENCOUNTER — Encounter (HOSPITAL_BASED_OUTPATIENT_CLINIC_OR_DEPARTMENT_OTHER): Payer: Medicaid Other | Admitting: Hematology & Oncology

## 2015-05-16 VITALS — BP 132/94 | HR 89 | Resp 20 | Wt 164.0 lb

## 2015-05-16 DIAGNOSIS — Z79899 Other long term (current) drug therapy: Secondary | ICD-10-CM

## 2015-05-16 DIAGNOSIS — C719 Malignant neoplasm of brain, unspecified: Secondary | ICD-10-CM

## 2015-05-16 DIAGNOSIS — C711 Malignant neoplasm of frontal lobe: Secondary | ICD-10-CM | POA: Diagnosis not present

## 2015-05-16 MED ORDER — OXYCODONE HCL 5 MG PO CAPS: 5.0000 mg | ORAL_CAPSULE | ORAL | Status: DC | PRN

## 2015-05-16 NOTE — Patient Instructions (Addendum)
[place] at [facility] Discharge Instructions  RECOMMENDATIONS MADE BY THE CONSULTANT AND ANY TEST RESULTS WILL BE SENT TO YOUR REFERRING PHYSICIAN.   Prescription for Oxycodone given  New chemo treatment schedule given today.   No chemo today.You are not due until next week for chemo.  Thank you for choosing [place] at [facility] to provide your oncology and hematology care.  To afford each patient quality time with our provider, please arrive at least 15 minutes before your scheduled appointment time.    You need to re-schedule your appointment should you arrive 10 or more minutes late.  We strive to give you quality time with our providers, and arriving late affects you and other patients whose appointments are after yours.  Also, if you no show three or more times for appointments you may be dismissed from the clinic at the providers discretion.     Again, thank you for choosing [facility].  Our hope is that these requests will decrease the amount of time that you wait before being seen by our physicians.       _____________________________________________________________  Should you have questions after your visit to [facility], please contact our office at [phone] between the hours of 8:30 a.m. and 4:30 p.m.  Voicemails left after 4:30 p.m. will not be returned until the following business day.  For prescription refill requests, have your pharmacy contact our office.

## 2015-05-16 NOTE — Progress Notes (Signed)
Cody Hampshire, MD [place] [id number]   Glioblastoma   CURRENT THERAPY:  04/30/15: Disease progression. Discontinue Irinotecan. Initiate Carboplatin AUC 4 IV every 4 weeks with continued concurrent Avastin 10 mgkg IV every 2 weeks.   INTERVAL HISTORY: Cody Buchanan 59 y.o. male returns for followup of glioblastoma.  He looks after his mother, they live together. Father died recently from congestive heart failure at the age of 66.  He says the recent treatment with carboplatin and avastin went well. He doesn't get to sleep until around 2-3 am, but this is his normal.  Discussed whether he was depressed with the recent passing of his father and his cancer, he says he keeps himself busy to avoid becoming depressed. He is concerned about weight loss, he says he doesn't feel as hungry anymore. He makes himself eat but still feels as if it isn't enough. His weight is down 16 pounds from January.   Oncology History   03/03/2014 Partial resection, Cody Buchanan, [place], Dr. [name] 04/07/2014 Re-resection at [place], Dr. [name]     Malignant brain tumor, left frontal glioblastoma multiforme grade 4   03/03/2014 Initial Diagnosis Malignant brain tumor, left frontal glioblastoma multiforme grade 4, [place], [place]. partial resection, Dr. [name]   04/07/2014 Surgery Re-resection at [place], Dr. Derrill Memo   05/10/2014 - 06/21/2014 Radiation Therapy By Dr. Sondra Come with 6000 cGy in 30 fractions to left frontal brain   07/07/2014 -  Chemotherapy Temodar x5 days every 4 weeks for 2 cycles +2 infusions of patient's own lymphocytes.   08/23/2014 Progression MRI showed progression at [facility]   09/27/2014 - 04/18/2015 Chemotherapy Avastin started with irinotecan added on 10/11/2014    12/06/2014 Imaging MRI brain at [facility]- Decreased enhancement, edema, and mass effect. This may represent pseudoresponse related to interval chemotherapy  versus true response.   02/07/2015 Imaging MRI brain at [facility]- Increasing nodular enhancement along the medial aspect of the left frontalresection cavity.   04/04/2015 Imaging MRI brain at [facility]- Slight interval increase in nodular enhancement within the genu of thecorpus callosum and increased size of a nodular focus of enhancement medialto the resection cavity.    05/09/2015 -  Chemotherapy Carboplatin AUC 4 Q 28 days, AVASTIN 10 mg/kg q 2 weeks    Past Medical History  Diagnosis Date  . Glioblastoma   . GERD (gastroesophageal reflux disease)   . Seizures   . Anxiety 09/07/2014    has Malignant brain tumor, left frontal glioblastoma multiforme grade 4; Chronic obstructive pulmonary disease; Seizure disorder; Allergic rhinitis; Acute gouty arthritis; and Anxiety on his problem list.     has No Known Allergies.  Mr. Cahall had no medications administered during this visit.  Past Surgical History  Procedure Laterality Date  . Salivary gland surgery    . Brain tumor excision     Father died at 4 yo from congestive heart failure recently, he had diabetes, high potassium, and was restricted to a hover-round.  Mother is living, he lives with her and takes care of her.  Denies any headaches, dizziness, double vision, fevers, chills, night sweats, nausea, vomiting, diarrhea, constipation, chest pain, heart palpitations, shortness of breath, blood in stool, black tarry stool, urinary pain, urinary burning, urinary frequency, hematuria. Positive for weight loss and appetite loss.   PHYSICAL EXAMINATION ECOG PERFORMANCE STATUS: 1 - Symptomatic but completely ambulatory  Filed Vitals:   05/16/15 0841  BP: 132/94  Pulse: 89  Resp: 20    GENERAL:alert, no distress, well nourished, well developed, comfortable, cooperative and smiling SKIN: skin color, texture, turgor are normal, no rashes or significant lesions HEAD: Normocephalic, No masses, lesions, tenderness or abnormalities EYES: normal,  PERRLA, EOMI, Conjunctiva are pink and non-injected EARS: External ears normal OROPHARYNX:lips, buccal mucosa, and tongue normal and mucous membranes are moist  NECK: supple, no adenopathy, thyroid normal size, non-tender, without nodularity, no stridor, non-tender, trachea midline LYMPH:  no palpable lymphadenopathy BREAST:not examined LUNGS: clear to auscultation  HEART: regular rate & rhythm ABDOMEN:abdomen soft, non-tender and normal bowel sounds BACK: Back symmetric, no curvature., No CVA tenderness EXTREMITIES:less then 2 second capillary refill, no joint deformities, effusion, or inflammation, no skin discoloration, no cyanosis  NEURO: alert & oriented x 3 with fluent speech, no focal motor/sensory deficits, gait normal   LABORATORY DATA: CBC    Component Value Date/Time   WBC 10.7* 05/09/2015 1118   RBC 4.80 05/09/2015 1118   HGB 13.9 05/09/2015 1118   HCT 42.4 05/09/2015 1118   PLT 336 05/09/2015 1118   MCV 88.3 05/09/2015 1118   MCH 29.0 05/09/2015 1118   MCHC 32.8 05/09/2015 1118   RDW 15.7* 05/09/2015 1118   LYMPHSABS 1.4 05/09/2015 1118   MONOABS 0.8 05/09/2015 1118   EOSABS 0.8* 05/09/2015 1118   BASOSABS 0.1 05/09/2015 1118      Chemistry      Component Value Date/Time   NA 135 05/09/2015 1118   K 3.7 05/09/2015 1118   CL 103 05/09/2015 1118   CO2 26 05/09/2015 1118   BUN 7 05/09/2015 1118   CREATININE 1.34* 05/09/2015 1118      Component Value Date/Time   CALCIUM 8.5* 05/09/2015 1118   ALKPHOS 63 04/18/2015 0830   AST 18 04/18/2015 0830   ALT 18 04/18/2015 0830   BILITOT 0.8 04/18/2015 0830     RADIOLOGY:   MRI BRAIN WITHOUT AND WITH CONTRAST   INDICATION: C71.1 Malignant neoplasm of frontal lobe, Evaluate for tumor progression provided. .  COMPARISON: Brain MRI dated 04/02/2015, 02/07/2015  TECHNIQUE/PROTOCOL: Standard adult brain protocol.   FINDINGS: Patient status post left frontal craniotomy. Underlying resection cavity within the left  frontal lobe demonstrates irregular, nodular areas of enhancement most notably within the medial aspect of the resection cavity. There is increased nodular enhancement of the left aspect of the genu of the corpus callosum (series 11, image 122). The size of the nodule also appears larger measuring 1.7 x 1.7 x 1.4 cm (series 11 image 118; series 12, image 151), previously 1.4 x 1.7 x 1.0 cm (remeasured). Areas of restricted diffusion may represent necrotic tissue. Areas of increased FLAIR signal around the bilateral centrum semiovale and subinsular white matter appear similar to prior. There is no acute cortical infarct, intracranial hemorrhage, new mass, or mass-effect. The extra-axial spaces, basal cisterns, ventricles and sulci are normal. Intracranial flow-voids appear normal. The orbits, cranium, mastoid sinuses, and visualized paranasal sinuses are unremarkable.  BTC MRI Interpretation: MRI from 04/30/15 was compared to MRI from 04/02/15 by Davonna Belling, MD and Lenon Ahmadi, AGNP using Actual scan. Findings included increased FLAIR and enhancement across the genu of the corpus callosum and and are consistent with Progressive radiographic disease.   IMPRESSION: Mildly increased nodular enhancement and FLAIR signal within the genu of the corpus callosum and along the medial aspect of the resection cavity compared to April MRI, but more definitely increased compared to February MRI. Findings are concerning for disease  progression.   Electronically Reviewed by: Eldridge Scot, MD Electronically Reviewed on: 04/30/2015 11:00 AM   ASSESSMENT AND PLAN:  Glioblastoma Recent progression on MRI at [place] in therapy on 05/09/2015  He will continue forward with Carboplatin every 4 weeks. Avastin every 2 weeks. He has done well with new therapy. Follow up in one week with Kirby Crigler PA.  I will refill his pain medication as requested.   All questions were answered. The  patient knows to call the clinic with any problems, questions or concerns. We can certainly see the patient much sooner if necessary.  This document serves as a record of services personally performed by Ancil Linsey, MD. It was created on her behalf by Arlyce Harman, a trained medical scribe. The creation of this record is based on the scribe's personal observations and the provider's statements to them. This document has been checked and approved by the attending provider.  I have reviewed the above documentation for accuracy and completeness, and I agree with the above.  Molli Hazard, MD

## 2015-05-16 NOTE — Progress Notes (Signed)
Error in scheduling, not due for chemo today.

## 2015-05-23 ENCOUNTER — Encounter (HOSPITAL_COMMUNITY): Payer: Self-pay

## 2015-05-23 ENCOUNTER — Encounter (HOSPITAL_BASED_OUTPATIENT_CLINIC_OR_DEPARTMENT_OTHER): Payer: Medicaid Other

## 2015-05-23 ENCOUNTER — Encounter: Payer: Self-pay | Admitting: Dietician

## 2015-05-23 ENCOUNTER — Encounter: Payer: Self-pay | Admitting: *Deleted

## 2015-05-23 ENCOUNTER — Encounter (HOSPITAL_BASED_OUTPATIENT_CLINIC_OR_DEPARTMENT_OTHER): Payer: Medicaid Other | Admitting: Oncology

## 2015-05-23 VITALS — BP 116/84 | HR 78 | Temp 98.7°F | Resp 18 | Wt 165.3 lb

## 2015-05-23 DIAGNOSIS — C711 Malignant neoplasm of frontal lobe: Secondary | ICD-10-CM | POA: Diagnosis not present

## 2015-05-23 DIAGNOSIS — C719 Malignant neoplasm of brain, unspecified: Secondary | ICD-10-CM | POA: Diagnosis present

## 2015-05-23 DIAGNOSIS — Z79899 Other long term (current) drug therapy: Secondary | ICD-10-CM

## 2015-05-23 DIAGNOSIS — Z5112 Encounter for antineoplastic immunotherapy: Secondary | ICD-10-CM | POA: Diagnosis present

## 2015-05-23 LAB — BASIC METABOLIC PANEL
ANION GAP: 5 (ref 5–15)
BUN: 16 mg/dL (ref 6–20)
CHLORIDE: 106 mmol/L (ref 101–111)
CO2: 24 mmol/L (ref 22–32)
CREATININE: 1 mg/dL (ref 0.61–1.24)
Calcium: 8.7 mg/dL — ABNORMAL LOW (ref 8.9–10.3)
GFR calc Af Amer: >60 mL/min (ref >60)
Glucose, Bld: 86 mg/dL (ref 65–99)
POTASSIUM: 4.1 mmol/L (ref 3.5–5.1)
SODIUM: 135 mmol/L (ref 135–145)

## 2015-05-23 LAB — URINALYSIS, DIPSTICK ONLY
BILIRUBIN URINE: NEGATIVE
Glucose, UA: NEGATIVE mg/dL
Hgb urine dipstick: NEGATIVE
KETONES UR: NEGATIVE mg/dL
Leukocytes, UA: NEGATIVE
NITRITE: NEGATIVE
PROTEIN: NEGATIVE mg/dL
Specific Gravity, Urine: 1.015 (ref 1.005–1.030)
Urobilinogen, UA: 0.2 mg/dL (ref 0.0–1.0)
pH: 5.5 (ref 5.0–8.0)

## 2015-05-23 LAB — CBC WITH DIFFERENTIAL/PLATELET
BASOS PCT: 1 % (ref 0–1)
Basophils Absolute: 0.1 10*3/uL (ref 0.0–0.1)
Eosinophils Absolute: 0.3 10*3/uL (ref 0.0–0.7)
Eosinophils Relative: 3 % (ref 0–5)
HCT: 42.6 % (ref 39.0–52.0)
Hemoglobin: 14.2 g/dL (ref 13.0–17.0)
Lymphocytes Relative: 12 % (ref 12–46)
Lymphs Abs: 1.5 10*3/uL (ref 0.7–4.0)
MCH: 29.6 pg (ref 26.0–34.0)
MCHC: 33.3 g/dL (ref 30.0–36.0)
MCV: 88.8 fL (ref 78.0–100.0)
MONOS PCT: 7 % (ref 3–12)
Monocytes Absolute: 0.9 10*3/uL (ref 0.1–1.0)
Neutro Abs: 10 10*3/uL — ABNORMAL HIGH (ref 1.7–7.7)
Neutrophils Relative %: 77 % (ref 43–77)
PLATELETS: 245 10*3/uL (ref 150–400)
RBC: 4.8 MIL/uL (ref 4.22–5.81)
RDW: 15.5 % (ref 11.5–15.5)
WBC: 12.7 10*3/uL — ABNORMAL HIGH (ref 4.0–10.5)

## 2015-05-23 MED ORDER — SODIUM CHLORIDE 0.9 % IV SOLN
10.0000 mg/kg | Freq: Once | INTRAVENOUS | Status: AC
  Administered 2015-05-23: 800 mg via INTRAVENOUS
  Filled 2015-05-23: qty 32

## 2015-05-23 MED ORDER — OXYCODONE HCL 5 MG PO CAPS: 5.0000 mg | ORAL_CAPSULE | ORAL | Status: DC | PRN

## 2015-05-23 MED ORDER — SODIUM CHLORIDE 0.9 % IJ SOLN
10.0000 mL | INTRAMUSCULAR | Status: DC | PRN
  Administered 2015-05-23: 10 mL
  Filled 2015-05-23: qty 10

## 2015-05-23 MED ORDER — HEPARIN SOD (PORK) LOCK FLUSH 100 UNIT/ML IV SOLN
500.0000 [IU] | Freq: Once | INTRAVENOUS | Status: AC | PRN
  Administered 2015-05-23: 500 [IU]
  Filled 2015-05-23: qty 5

## 2015-05-23 MED ORDER — SODIUM CHLORIDE 0.9 % IV SOLN
Freq: Once | INTRAVENOUS | Status: AC
  Administered 2015-05-23: 10:00:00 via INTRAVENOUS

## 2015-05-23 NOTE — Progress Notes (Signed)
Asked to assess patient by Education officer, museum.   Contacted Pt by Phone  Wt Readings from Last 10 Encounters:  05/23/15 165 lb 4.8 oz (74.98 kg)  05/16/15 164 lb (74.39 kg)  05/09/15 170 lb 3.2 oz (77.202 kg)  04/18/15 174 lb 9.6 oz (79.198 kg)  04/04/15 177 lb 4.8 oz (80.423 kg)  03/21/15 176 lb 3.2 oz (79.924 kg)  02/21/15 178 lb 1.6 oz (80.786 kg)  02/09/15 177 lb 3.2 oz (80.377 kg)  01/26/15 176 lb (79.833 kg)  01/24/15 175 lb 1.6 oz (79.425 kg)   Patient weight has decreased by about 10 lbs in the last month.  Spoke with Pt's mom as patient was out at the time of calling.   Patient's mother reports pts intake as significantly decreased. She states that although pt is suffering from "a little of everything (n/vc/d)" , his biggest issue is depression. Pt has just lost his father and is coping with his loss. Mother states that his appetite is very variable; Some days he eat great and other times he skips meals.   She states that he has not had any trouble eating until this recent loss. She says he does supplement his diet with Ensure/Boost as he hasnt needed to.   Let mother know that I would try again to reach patient in a few days. She told me she would let pt know I called.   Burtis Junes RD, LDN Nutrition Pager: [phone] 05/23/2015 12:52 PM

## 2015-05-23 NOTE — Patient Instructions (Signed)
[facility] Discharge Instructions for Patients Receiving Chemotherapy  Today you received the following chemotherapy agent:  Avastin Pain medication refill today. Return in 2 weeks for chemotherapy (carboplatin, Avastin) and office visit.  If you develop nausea and vomiting, or diarrhea that is not controlled by your medication, call the clinic.  The clinic phone number is [phone]. Office hours are Monday-Friday 8:30am-5:00pm.  BELOW ARE SYMPTOMS THAT SHOULD BE REPORTED IMMEDIATELY:  *FEVER GREATER THAN 101.0 F  *CHILLS WITH OR WITHOUT FEVER  NAUSEA AND VOMITING THAT IS NOT CONTROLLED WITH YOUR NAUSEA MEDICATION  *UNUSUAL SHORTNESS OF BREATH  *UNUSUAL BRUISING OR BLEEDING  TENDERNESS IN MOUTH AND THROAT WITH OR WITHOUT PRESENCE OF ULCERS  *URINARY PROBLEMS  *BOWEL PROBLEMS  UNUSUAL RASH Items with * indicate a potential emergency and should be followed up as soon as possible. If you have an emergency after office hours please contact your primary care physician or go to the nearest emergency department.  Please call the clinic during office hours if you have any questions or concerns.   You may also contact the Patient Navigator at [phone] should you have any questions or need assistance in obtaining follow up care. _____________________________________________________________________ Have you asked about our STAR program?    STAR stands for Survivorship Training and Rehabilitation, and this is a nationally recognized cancer care program that focuses on survivorship and rehabilitation.  Cancer and cancer treatments may cause problems, such as, pain, making you feel tired and keeping you from doing the things that you need or want to do. Cancer rehabilitation can help. Our goal is to reduce these troubling effects and help you have the best quality of life possible.  You may receive a survey from a nurse that asks questions about your current state  of health.  Based on the survey results, all eligible patients will be referred to the [facility] program for an evaluation so we can better serve you! A frequently asked questions sheet is available upon request.

## 2015-05-23 NOTE — Progress Notes (Signed)
Cody Hampshire, MD [place] [address]  Malignant brain tumor, left frontal glioblastoma multiforme grade 4 - Plan: oxycodone (OXY-IR) 5 MG capsule  CURRENT THERAPY: Carboplatin AUC 4 every 28 days and Avastin every 14 days. Beginning on 05/09/2015   INTERVAL HISTORY: Cody Buchanan 59 y.o. male returns for followup of Glioblastoma.   Oncology History   03/03/2014 Partial resection, [name], [place], Dr. [name] 04/07/2014 Re-resection at [place], Dr. [name]     Malignant brain tumor, left frontal glioblastoma multiforme grade 4   03/03/2014 Initial Diagnosis Malignant brain tumor, left frontal glioblastoma multiforme grade 4, [place], [place]. partial resection, Dr. [name]   04/07/2014 Surgery Re-resection at [place], Dr. Derrill Memo   05/10/2014 - 06/21/2014 Radiation Therapy By Dr. Sondra Come with 6000 cGy in 30 fractions to left frontal brain   07/07/2014 -  Chemotherapy Temodar x5 days every 4 weeks for 2 cycles +2 infusions of patient's own lymphocytes.   08/23/2014 Progression MRI showed progression at [facility]   09/27/2014 - 04/18/2015 Chemotherapy Avastin started with irinotecan added on 10/11/2014    12/06/2014 Imaging MRI brain at [facility]- Decreased enhancement, edema, and mass effect. This may represent pseudoresponse related to interval chemotherapy versus true response.   02/07/2015 Imaging MRI brain at [facility]- Increasing nodular enhancement along the medial aspect of the left frontalresection cavity.   04/04/2015 Progression MRI brain at [facility]- Slight interval increase in nodular enhancement within the genu of thecorpus callosum and increased size of a nodular focus of enhancement medialto the resection cavity.    05/09/2015 -  Chemotherapy Carboplatin AUC 4 Q 28 days, AVASTIN 10 mg/kg q 2 weeks   He is tolerating therapy well without any complaints.  His father passed recently which has increased his stress at home.   He notes that his weight is down, but he reports that it is secondary to the loss of his father.   He is down about 10 lbs since March 2016.  He notes his appetite was good until his father death.  We will need to monitor closely.    Past Medical History  Diagnosis Date  . Glioblastoma   . GERD (gastroesophageal reflux disease)   . Seizures   . Anxiety 09/07/2014    has Malignant brain tumor, left frontal glioblastoma multiforme grade 4; Chronic obstructive pulmonary disease; Seizure disorder; Allergic rhinitis; Acute gouty arthritis; and Anxiety on his problem list.     has No Known Allergies.  Current Outpatient Prescriptions on File Prior to Visit  Medication Sig Dispense Refill  . acetaminophen (TYLENOL) 325 MG tablet Take 650 mg by mouth every 6 (six) hours as needed for mild pain.     Marland Kitchen albuterol (PROVENTIL HFA;VENTOLIN HFA) 108 (90 BASE) MCG/ACT inhaler Inhale into the lungs.    . ALPRAZolam (XANAX) 1 MG tablet Take 1 tablet (1 mg total) by mouth 3 (three) times daily as needed for anxiety. 60 tablet 1  . Bevacizumab (AVASTIN) 100 MG/4ML SOLN Inject into the vein.    . bisacodyl (DULCOLAX) 5 MG EC tablet Take 5 mg by mouth daily as needed for moderate constipation.    Marland Kitchen ibuprofen (ADVIL,MOTRIN) 200 MG tablet Take 400 mg by mouth every 6 (six) hours as needed for mild pain. Pain    . levETIRAcetam (KEPPRA) 1000 MG tablet Take 1 tablet (1,000 mg total) by mouth 2 (two) times daily. 60 tablet 1  . lidocaine-prilocaine (EMLA)  cream Apply a quarter size amount to port site 1 hour prior to chemo. Do not rub in. Cover with plastic wrap. 30 g 3  . loperamide (IMODIUM) 2 MG capsule Take 2 mg by mouth as needed for diarrhea or loose stools. Take 2 tablets after first loose stool and then 1 tablet every 2 hours until you have gone 12 hours without a loose stool. If you develop loose stools during the night, take 2 tablets every 4 hours. Contact the [facility] for uncontrolled loose stools.      . Multiple Vitamins-Minerals (MULTIVITAMIN WITH MINERALS) tablet Take 1 tablet by mouth daily.    . ondansetron (ZOFRAN-ODT) 8 MG disintegrating tablet Take 8 mg by mouth every 8 (eight) hours as needed for nausea or vomiting.    . prochlorperazine (COMPAZINE) 10 MG tablet Take 1 tablet (10 mg total) by mouth every 6 (six) hours as needed for nausea or vomiting. (Patient not taking: Reported on 05/16/2015) 30 tablet 1   Current Facility-Administered Medications on File Prior to Visit  Medication Dose Route Frequency Provider Last Rate Last Dose  . bevacizumab (AVASTIN) 800 mg in sodium chloride 0.9 % 100 mL chemo infusion  10 mg/kg (Treatment Plan Actual) Intravenous Once Patrici Ranks, MD 396 mL/hr at 05/23/15 0931 800 mg at 05/23/15 0931  . heparin lock flush 100 unit/mL  500 Units Intracatheter Once PRN Patrici Ranks, MD      . sodium chloride 0.9 % injection 10 mL  10 mL Intravenous PRN Patrici Ranks, MD   10 mL at 01/24/15 1114  . sodium chloride 0.9 % injection 10 mL  10 mL Intracatheter PRN Patrici Ranks, MD        Past Surgical History  Procedure Laterality Date  . Salivary gland surgery    . Brain tumor excision      Denies any headaches, dizziness, fevers, chills, night sweats, nausea, vomiting, diarrhea, constipation, chest pain, heart palpitations, shortness of breath, blood in stool, black tarry stool, urinary pain, urinary burning, urinary frequency, hematuria.   PHYSICAL EXAMINATION  ECOG PERFORMANCE STATUS: 1 - Symptomatic but completely ambulatory  There were no vitals filed for this visit.  GENERAL:alert, no distress, well nourished, well developed, comfortable, cooperative and smiling SKIN: skin color, texture, turgor are normal, no rashes or significant lesions HEAD: Normocephalic, No masses, lesions, tenderness or abnormalities EYES: normal, PERRLA, EOMI, Conjunctiva are pink and non-injected EARS: External ears normal OROPHARYNX:lips, buccal  mucosa, and tongue normal and mucous membranes are moist  NECK: supple, trachea midline LYMPH:  no palpable lymphadenopathy BREAST:not examined LUNGS: clear to auscultation and percussion HEART: regular rate & rhythm, no murmurs, no gallops, S1 normal and S2 normal ABDOMEN:abdomen soft, non-tender, obese and normal bowel sounds BACK: Back symmetric, no curvature. EXTREMITIES:less then 2 second capillary refill, no joint deformities, effusion, or inflammation, no edema, no skin discoloration, no cyanosis  NEURO: alert & oriented x 3 with fluent speech, no focal motor/sensory deficits, gait normal   LABORATORY DATA: CBC    Component Value Date/Time   WBC 12.7* 05/23/2015 0857   RBC 4.80 05/23/2015 0857   HGB 14.2 05/23/2015 0857   HCT 42.6 05/23/2015 0857   PLT 245 05/23/2015 0857   MCV 88.8 05/23/2015 0857   MCH 29.6 05/23/2015 0857   MCHC 33.3 05/23/2015 0857   RDW 15.5 05/23/2015 0857   LYMPHSABS 1.5 05/23/2015 0857   MONOABS 0.9 05/23/2015 0857   EOSABS 0.3 05/23/2015 0857   BASOSABS 0.1  05/23/2015 0857      Chemistry      Component Value Date/Time   NA 135 05/23/2015 0857   K 4.1 05/23/2015 0857   CL 106 05/23/2015 0857   CO2 24 05/23/2015 0857   BUN 16 05/23/2015 0857   CREATININE 1.00 05/23/2015 0857      Component Value Date/Time   CALCIUM 8.7* 05/23/2015 0857   ALKPHOS 63 04/18/2015 0830   AST 18 04/18/2015 0830   ALT 18 04/18/2015 0830   BILITOT 0.8 04/18/2015 0830       ASSESSMENT AND PLAN:  Malignant brain tumor, left frontal glioblastoma multiforme grade 4 Glioblastoma with primary oncology at [facility].  Currently on Carbo/Avastin after failure of Irinotecan/Avastin after extended treatment on the latter.  Tolerating therapy well at this time.  Pre-chemotherapy labs as ordered including UA.  Rx refill for Oxycodone.   Return in 2 weeks for follow-up.     THERAPY PLAN:  Continue treatment as planned with follow-up imaging and  appointment at [facility] at the end of June 2016.  All questions were answered. The patient knows to call the clinic with any problems, questions or concerns. We can certainly see the patient much sooner if necessary.  Patient and plan discussed with Dr. Ancil Linsey and she is in agreement with the aforementioned.   This note is electronically signed by: Doy Mince 05/23/2015 9:32 AM

## 2015-05-23 NOTE — Assessment & Plan Note (Signed)
Glioblastoma with primary oncology at [facility].  Currently on Carbo/Avastin after failure of Irinotecan/Avastin after extended treatment on the latter.  Tolerating therapy well at this time.  Pre-chemotherapy labs as ordered including UA.  Rx refill for Oxycodone.   Return in 2 weeks for follow-up.

## 2015-05-23 NOTE — Progress Notes (Signed)
Tolerated infusion w/o adverse reaction.  VSS.  A&Ox4; in no distress.  Discharged ambulatory.

## 2015-05-23 NOTE — Progress Notes (Signed)
[place] Clinical Social Work  Clinical Social Work met with pt briefly to offer support and re-assess needs due to recent loss of his father at the beginning of the month. Pt shared common emotions related to grief incident. Overall, he is coping well. 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  Pt shared concerns due to recent weight loss. He feels this is related to recent grief, but is somewhat concerned. CSW discussed resources of dietician and will send in basket. CSW to continue to follow and assist as appropriate.   Clinical Social Work interventions: Grief support Referral to dietician   Loren Racer, [place] Tuesdays 8:30-1pm Wednesdays 8:30-12pm  [phone]

## 2015-05-25 ENCOUNTER — Encounter: Payer: Self-pay | Admitting: Dietician

## 2015-05-25 NOTE — Progress Notes (Signed)
Tried again to contact pt.  Wt Readings from Last 10 Encounters:  05/23/15 165 lb 4.8 oz (74.98 kg)  05/16/15 164 lb (74.39 kg)  05/09/15 170 lb 3.2 oz (77.202 kg)  04/18/15 174 lb 9.6 oz (79.198 kg)  04/04/15 177 lb 4.8 oz (80.423 kg)  03/21/15 176 lb 3.2 oz (79.924 kg)  02/21/15 178 lb 1.6 oz (80.786 kg)  02/09/15 177 lb 3.2 oz (80.377 kg)  01/26/15 176 lb (79.833 kg)  01/24/15 175 lb 1.6 oz (79.425 kg)   Patient weight has declined by about 10 lbs in the last month  Patient reports oral intake as variable and is suffering from some severe stress and grief. He is deeply invested in taking care of his mother who has her own medical problems.   He denies any n/v/c/d at this time.   Pt states that he eats about 2 meals a day with a snack in between. He feels that Ensure may help him gain back some of the weight he has lost and heard about the Ensure program through the social worker. He asked for a case of Chocolate. Will order for his next appt.   Burtis Junes RD, LDN Nutrition Pager: [phone] 05/25/2015 4:17 PM

## 2015-05-30 ENCOUNTER — Encounter (HOSPITAL_COMMUNITY): Payer: Medicaid Other

## 2015-06-06 ENCOUNTER — Encounter (HOSPITAL_BASED_OUTPATIENT_CLINIC_OR_DEPARTMENT_OTHER): Payer: Medicaid Other | Admitting: Oncology

## 2015-06-06 ENCOUNTER — Encounter (HOSPITAL_COMMUNITY): Payer: Medicaid Other | Attending: Hematology and Oncology

## 2015-06-06 VITALS — BP 140/79 | HR 82 | Temp 98.0°F | Resp 16 | Wt 160.4 lb

## 2015-06-06 VITALS — BP 127/84 | HR 66 | Temp 98.1°F | Resp 18

## 2015-06-06 DIAGNOSIS — C719 Malignant neoplasm of brain, unspecified: Secondary | ICD-10-CM | POA: Diagnosis present

## 2015-06-06 DIAGNOSIS — C711 Malignant neoplasm of frontal lobe: Secondary | ICD-10-CM

## 2015-06-06 DIAGNOSIS — Z79899 Other long term (current) drug therapy: Secondary | ICD-10-CM

## 2015-06-06 DIAGNOSIS — Z5111 Encounter for antineoplastic chemotherapy: Secondary | ICD-10-CM | POA: Diagnosis present

## 2015-06-06 DIAGNOSIS — Z5112 Encounter for antineoplastic immunotherapy: Secondary | ICD-10-CM

## 2015-06-06 LAB — CBC WITH DIFFERENTIAL/PLATELET
BASOS ABS: 0.1 10*3/uL (ref 0.0–0.1)
Basophils Relative: 1 % (ref 0–1)
Eosinophils Absolute: 0.1 10*3/uL (ref 0.0–0.7)
Eosinophils Relative: 1 % (ref 0–5)
HEMATOCRIT: 43.2 % (ref 39.0–52.0)
Hemoglobin: 14.6 g/dL (ref 13.0–17.0)
LYMPHS ABS: 1.4 10*3/uL (ref 0.7–4.0)
Lymphocytes Relative: 14 % (ref 12–46)
MCH: 30.4 pg (ref 26.0–34.0)
MCHC: 33.8 g/dL (ref 30.0–36.0)
MCV: 90 fL (ref 78.0–100.0)
MONOS PCT: 5 % (ref 3–12)
Monocytes Absolute: 0.5 10*3/uL (ref 0.1–1.0)
NEUTROS ABS: 7.9 10*3/uL — AB (ref 1.7–7.7)
NEUTROS PCT: 79 % — AB (ref 43–77)
PLATELETS: 198 10*3/uL (ref 150–400)
RBC: 4.8 MIL/uL (ref 4.22–5.81)
RDW: 15.3 % (ref 11.5–15.5)
WBC: 10 10*3/uL (ref 4.0–10.5)

## 2015-06-06 LAB — URINALYSIS, DIPSTICK ONLY
BILIRUBIN URINE: NEGATIVE
Glucose, UA: NEGATIVE mg/dL
Hgb urine dipstick: NEGATIVE
KETONES UR: NEGATIVE mg/dL
LEUKOCYTES UA: NEGATIVE
NITRITE: NEGATIVE
PH: 5.5 (ref 5.0–8.0)
Protein, ur: NEGATIVE mg/dL
SPECIFIC GRAVITY, URINE: 1.025 (ref 1.005–1.030)
UROBILINOGEN UA: 0.2 mg/dL (ref 0.0–1.0)

## 2015-06-06 LAB — BASIC METABOLIC PANEL
Anion gap: 9 (ref 5–15)
BUN: 12 mg/dL (ref 6–20)
CO2: 24 mmol/L (ref 22–32)
Calcium: 8.6 mg/dL — ABNORMAL LOW (ref 8.9–10.3)
Chloride: 103 mmol/L (ref 101–111)
Creatinine, Ser: 1 mg/dL (ref 0.61–1.24)
GFR calc Af Amer: >60 mL/min (ref >60)
GFR calc non Af Amer: >60 mL/min (ref >60)
Glucose, Bld: 93 mg/dL (ref 65–99)
POTASSIUM: 4 mmol/L (ref 3.5–5.1)
SODIUM: 136 mmol/L (ref 135–145)

## 2015-06-06 MED ORDER — SODIUM CHLORIDE 0.9 % IV SOLN
Freq: Once | INTRAVENOUS | Status: AC
  Administered 2015-06-06: 11:00:00 via INTRAVENOUS

## 2015-06-06 MED ORDER — HEPARIN SOD (PORK) LOCK FLUSH 100 UNIT/ML IV SOLN
500.0000 [IU] | Freq: Once | INTRAVENOUS | Status: AC | PRN
  Administered 2015-06-06: 500 [IU]

## 2015-06-06 MED ORDER — SODIUM CHLORIDE 0.9 % IJ SOLN: 10.0000 mL | INTRAMUSCULAR | Status: DC | PRN

## 2015-06-06 MED ORDER — SODIUM CHLORIDE 0.9 % IV SOLN
441.6000 mg | Freq: Once | INTRAVENOUS | Status: AC
  Administered 2015-06-06: 440 mg via INTRAVENOUS
  Filled 2015-06-06: qty 44

## 2015-06-06 MED ORDER — OXYCODONE HCL 5 MG PO CAPS: 5.0000 mg | ORAL_CAPSULE | ORAL | Status: DC | PRN

## 2015-06-06 MED ORDER — HEPARIN SOD (PORK) LOCK FLUSH 100 UNIT/ML IV SOLN
INTRAVENOUS | Status: AC
  Filled 2015-06-06: qty 5

## 2015-06-06 MED ORDER — SODIUM CHLORIDE 0.9 % IV SOLN
Freq: Once | INTRAVENOUS | Status: AC
  Administered 2015-06-06: 12:00:00 via INTRAVENOUS
  Filled 2015-06-06: qty 8

## 2015-06-06 MED ORDER — SODIUM CHLORIDE 0.9 % IV SOLN: Freq: Once | INTRAVENOUS | Status: DC

## 2015-06-06 MED ORDER — HEPARIN SOD (PORK) LOCK FLUSH 100 UNIT/ML IV SOLN: 500.0000 [IU] | Freq: Once | INTRAVENOUS | Status: DC | PRN

## 2015-06-06 MED ORDER — SODIUM CHLORIDE 0.9 % IV SOLN
10.0000 mg/kg | Freq: Once | INTRAVENOUS | Status: AC
  Administered 2015-06-06: 800 mg via INTRAVENOUS
  Filled 2015-06-06: qty 32

## 2015-06-06 NOTE — Patient Instructions (Signed)
[facility] Discharge Instructions for Patients Receiving Chemotherapy  Today you received the following chemotherapy agents carbo and avastin   To help prevent nausea and vomiting after your treatment, we encourage you to take your nausea medication If you develop nausea and vomiting, or diarrhea that is not controlled by your medication, call the clinic.  The clinic phone number is [phone]. Office hours are Monday-Friday 8:30am-5:00pm.  BELOW ARE SYMPTOMS THAT SHOULD BE REPORTED IMMEDIATELY:  *FEVER GREATER THAN 101.0 F  *CHILLS WITH OR WITHOUT FEVER  NAUSEA AND VOMITING THAT IS NOT CONTROLLED WITH YOUR NAUSEA MEDICATION  *UNUSUAL SHORTNESS OF BREATH  *UNUSUAL BRUISING OR BLEEDING  TENDERNESS IN MOUTH AND THROAT WITH OR WITHOUT PRESENCE OF ULCERS  *URINARY PROBLEMS  *BOWEL PROBLEMS  UNUSUAL RASH Items with * indicate a potential emergency and should be followed up as soon as possible. If you have an emergency after office hours please contact your primary care physician or go to the nearest emergency department.  Please call the clinic during office hours if you have any questions or concerns.   You may also contact the Patient Navigator at [phone] should you have any questions or need assistance in obtaining follow up care. _____________________________________________________________________ Have you asked about our STAR program?    STAR stands for Survivorship Training and Rehabilitation, and this is a nationally recognized cancer care program that focuses on survivorship and rehabilitation.  Cancer and cancer treatments may cause problems, such as, pain, making you feel tired and keeping you from doing the things that you need or want to do. Cancer rehabilitation can help. Our goal is to reduce these troubling effects and help you have the best quality of life possible.  You may receive a survey from a nurse that asks questions about your  current state of health.  Based on the survey results, all eligible patients will be referred to the [facility] program for an evaluation so we can better serve you! A frequently asked questions sheet is available upon request.

## 2015-06-06 NOTE — Patient Instructions (Signed)
[place] at [facility] Discharge Instructions  RECOMMENDATIONS MADE BY THE CONSULTANT AND ANY TEST RESULTS WILL BE SENT TO YOUR REFERRING PHYSICIAN.  1.  Exam findings as discussed by T. Sheldon Silvan, PA-C. 2.  Return as scheduled in 2 weeks for chemo and follow-up. 3.  Your Oxycontin prescription was refilled today.  Thank you for choosing [place] at [facility] to provide your oncology and hematology care.  To afford each patient quality time with our provider, please arrive at least 15 minutes before your scheduled appointment time.    You need to re-schedule your appointment should you arrive 10 or more minutes late.  We strive to give you quality time with our providers, and arriving late affects you and other patients whose appointments are after yours.  Also, if you no show three or more times for appointments you may be dismissed from the clinic at the providers discretion.     Again, thank you for choosing [facility].  Our hope is that these requests will decrease the amount of time that you wait before being seen by our physicians.       _____________________________________________________________  Should you have questions after your visit to [facility], please contact our office at [phone] between the hours of 8:30 a.m. and 4:30 p.m.  Voicemails left after 4:30 p.m. will not be returned until the following business day.  For prescription refill requests, have your pharmacy contact our office.

## 2015-06-06 NOTE — Progress Notes (Signed)
[place] chemotherapy well today Discharged ambulatory

## 2015-06-06 NOTE — Progress Notes (Signed)
Cody Hampshire, MD [place] [address]  Malignant brain tumor, left frontal glioblastoma multiforme grade 4 - Plan: oxycodone (OXY-IR) 5 MG capsule  CURRENT THERAPY: Carboplatin AUC 4 every 28 days and Avastin every 14 days. Beginning on 05/09/2015   INTERVAL HISTORY: Cody Buchanan 59 y.o. male returns for followup of Glioblastoma.   Oncology History   03/03/2014 Partial resection, [name], [place], Dr. [name] 04/07/2014 Re-resection at [place], Dr. [name]     Malignant brain tumor, left frontal glioblastoma multiforme grade 4   03/03/2014 Initial Diagnosis Malignant brain tumor, left frontal glioblastoma multiforme grade 4, [place], [place]. partial resection, Dr. [name]   04/07/2014 Surgery Re-resection at [place], Dr. Derrill Memo   05/10/2014 - 06/21/2014 Radiation Therapy By Dr. Sondra Come with 6000 cGy in 30 fractions to left frontal brain   07/07/2014 -  Chemotherapy Temodar x5 days every 4 weeks for 2 cycles +2 infusions of patient's own lymphocytes.   08/23/2014 Progression MRI showed progression at [facility]   09/27/2014 - 04/18/2015 Chemotherapy Avastin started with irinotecan added on 10/11/2014    12/06/2014 Imaging MRI brain at [facility]- Decreased enhancement, edema, and mass effect. This may represent pseudoresponse related to interval chemotherapy versus true response.   02/07/2015 Imaging MRI brain at [facility]- Increasing nodular enhancement along the medial aspect of the left frontalresection cavity.   04/04/2015 Progression MRI brain at [facility]- Slight interval increase in nodular enhancement within the genu of thecorpus callosum and increased size of a nodular focus of enhancement medialto the resection cavity.    05/09/2015 -  Chemotherapy Carboplatin AUC 4 Q 28 days, AVASTIN 10 mg/kg q 2 weeks    Cody Buchanan is tolerating this therapy well without any issues to report.    He notes that his blurry vision is improved since  starting this chemotherapy regimen.  He notes that previously, he experienced episodes of blurry vision lasting 15- 20 minutes.  He notes that he does not have that issue anymore.  He denies any blurry vision.  He denies any seizure activity and double vision.  Speech is appropriate without slurring.  Cranial nerves are intact today.  He does not some occasional issues with balance, but that is stable he notes.  He denies any falls.   Past Medical History  Diagnosis Date  . Glioblastoma   . GERD (gastroesophageal reflux disease)   . Seizures   . Anxiety 09/07/2014    has Malignant brain tumor, left frontal glioblastoma multiforme grade 4; Chronic obstructive pulmonary disease; Seizure disorder; Allergic rhinitis; Acute gouty arthritis; and Anxiety on his problem list.     has No Known Allergies.  Current Outpatient Prescriptions on File Prior to Visit  Medication Sig Dispense Refill  . acetaminophen (TYLENOL) 325 MG tablet Take 650 mg by mouth every 6 (six) hours as needed for mild pain.     Marland Kitchen albuterol (PROVENTIL HFA;VENTOLIN HFA) 108 (90 BASE) MCG/ACT inhaler Inhale into the lungs.    . ALPRAZolam (XANAX) 1 MG tablet Take 1 tablet (1 mg total) by mouth 3 (three) times daily as needed for anxiety. 60 tablet 1  . Bevacizumab (AVASTIN) 100 MG/4ML SOLN Inject into the vein.    . bisacodyl (DULCOLAX) 5 MG EC tablet Take 5 mg by mouth daily as needed for moderate constipation.    Marland Kitchen ibuprofen (ADVIL,MOTRIN) 200 MG tablet Take 400 mg by mouth every 6 (six) hours as needed for mild  pain. Pain    . levETIRAcetam (KEPPRA) 1000 MG tablet Take 1 tablet (1,000 mg total) by mouth 2 (two) times daily. 60 tablet 1  . lidocaine-prilocaine (EMLA) cream Apply a quarter size amount to port site 1 hour prior to chemo. Do not rub in. Cover with plastic wrap. 30 g 3  . loperamide (IMODIUM) 2 MG capsule Take 2 mg by mouth as needed for diarrhea or loose stools. Take 2 tablets after first loose stool and then 1  tablet every 2 hours until you have gone 12 hours without a loose stool. If you develop loose stools during the night, take 2 tablets every 4 hours. Contact the [facility] for uncontrolled loose stools.    . Multiple Vitamins-Minerals (MULTIVITAMIN WITH MINERALS) tablet Take 1 tablet by mouth daily.    . ondansetron (ZOFRAN-ODT) 8 MG disintegrating tablet Take 8 mg by mouth every 8 (eight) hours as needed for nausea or vomiting.    . prochlorperazine (COMPAZINE) 10 MG tablet Take 1 tablet (10 mg total) by mouth every 6 (six) hours as needed for nausea or vomiting. (Patient not taking: Reported on 05/16/2015) 30 tablet 1   Current Facility-Administered Medications on File Prior to Visit  Medication Dose Route Frequency Provider Last Rate Last Dose  . sodium chloride 0.9 % injection 10 mL  10 mL Intravenous PRN Patrici Ranks, MD   10 mL at 01/24/15 1114    Past Surgical History  Procedure Laterality Date  . Salivary gland surgery    . Brain tumor excision      Denies any headaches, dizziness, fevers, chills, night sweats, nausea, vomiting, diarrhea, constipation, chest pain, heart palpitations, shortness of breath, blood in stool, black tarry stool, urinary pain, urinary burning, urinary frequency, hematuria.   PHYSICAL EXAMINATION  ECOG PERFORMANCE STATUS: 1 - Symptomatic but completely ambulatory  Filed Vitals:   06/06/15 1013  BP: 140/79  Pulse: 82  Temp: 98 F (36.7 C)  Resp: 16    GENERAL:alert, no distress, well nourished, well developed, comfortable, cooperative and smiling SKIN: skin color, texture, turgor are normal, no rashes or significant lesions HEAD: Normocephalic, No masses, lesions, tenderness or abnormalities EYES: normal, PERRLA, EOMI, Conjunctiva are pink and non-injected EARS: External ears normal OROPHARYNX:lips, buccal mucosa, and tongue normal and mucous membranes are moist  NECK: supple, trachea midline LYMPH:  no palpable lymphadenopathy BREAST:not  examined LUNGS: clear to auscultation and percussion HEART: regular rate & rhythm, no murmurs, no gallops, S1 normal and S2 normal ABDOMEN:abdomen soft, non-tender, obese and normal bowel sounds BACK: Back symmetric, no curvature. EXTREMITIES:less then 2 second capillary refill, no joint deformities, effusion, or inflammation, no edema, no skin discoloration, no cyanosis  NEURO: alert & oriented x 3 with fluent speech, no focal motor/sensory deficits, gait normal   LABORATORY DATA: CBC    Component Value Date/Time   WBC 12.7* 05/23/2015 0857   RBC 4.80 05/23/2015 0857   HGB 14.2 05/23/2015 0857   HCT 42.6 05/23/2015 0857   PLT 245 05/23/2015 0857   MCV 88.8 05/23/2015 0857   MCH 29.6 05/23/2015 0857   MCHC 33.3 05/23/2015 0857   RDW 15.5 05/23/2015 0857   LYMPHSABS 1.5 05/23/2015 0857   MONOABS 0.9 05/23/2015 0857   EOSABS 0.3 05/23/2015 0857   BASOSABS 0.1 05/23/2015 0857      Chemistry      Component Value Date/Time   NA 135 05/23/2015 0857   K 4.1 05/23/2015 0857   CL 106 05/23/2015 0857  CO2 24 05/23/2015 0857   BUN 16 05/23/2015 0857   CREATININE 1.00 05/23/2015 0857      Component Value Date/Time   CALCIUM 8.7* 05/23/2015 0857   ALKPHOS 63 04/18/2015 0830   AST 18 04/18/2015 0830   ALT 18 04/18/2015 0830   BILITOT 0.8 04/18/2015 0830       ASSESSMENT AND PLAN:  Malignant brain tumor, left frontal glioblastoma multiforme grade 4 Glioblastoma with primary oncology at [facility].  Currently on Carbo/Avastin after failure of Irinotecan/Avastin after extended treatment on the latter.  Tolerating therapy well at this time.  Pre-chemotherapy labs as ordered including UA.  Rx refill for Oxycodone.   Return in 2 weeks for follow-up.      THERAPY PLAN:  Continue treatment as planned with follow-up imaging and appointment at [facility] at the end of June 2016.  All questions were answered. The patient knows to call the clinic with any problems,  questions or concerns. We can certainly see the patient much sooner if necessary.  Patient and plan discussed with Dr. Ancil Linsey and she is in agreement with the aforementioned.   This note is electronically signed by: Doy Mince 06/06/2015 10:25 AM

## 2015-06-06 NOTE — Assessment & Plan Note (Signed)
Glioblastoma with primary oncology at [facility].  Currently on Carbo/Avastin after failure of Irinotecan/Avastin after extended treatment on the latter.  Tolerating therapy well at this time.  Pre-chemotherapy labs as ordered including UA.  Rx refill for Oxycodone.   Return in 2 weeks for follow-up.

## 2015-06-13 ENCOUNTER — Inpatient Hospital Stay (HOSPITAL_COMMUNITY): Payer: Medicaid Other

## 2015-06-20 ENCOUNTER — Encounter (HOSPITAL_BASED_OUTPATIENT_CLINIC_OR_DEPARTMENT_OTHER): Payer: Medicaid Other | Admitting: Hematology & Oncology

## 2015-06-20 ENCOUNTER — Encounter (HOSPITAL_COMMUNITY): Payer: Self-pay | Admitting: Hematology & Oncology

## 2015-06-20 ENCOUNTER — Encounter (HOSPITAL_BASED_OUTPATIENT_CLINIC_OR_DEPARTMENT_OTHER): Payer: Medicaid Other

## 2015-06-20 ENCOUNTER — Encounter: Payer: Self-pay | Admitting: *Deleted

## 2015-06-20 VITALS — BP 147/93 | HR 68 | Temp 98.6°F | Resp 18

## 2015-06-20 VITALS — BP 119/83 | HR 84 | Temp 98.0°F | Resp 18 | Wt 157.8 lb

## 2015-06-20 DIAGNOSIS — F4321 Adjustment disorder with depressed mood: Secondary | ICD-10-CM

## 2015-06-20 DIAGNOSIS — Z5112 Encounter for antineoplastic immunotherapy: Secondary | ICD-10-CM

## 2015-06-20 DIAGNOSIS — C711 Malignant neoplasm of frontal lobe: Secondary | ICD-10-CM

## 2015-06-20 DIAGNOSIS — C719 Malignant neoplasm of brain, unspecified: Secondary | ICD-10-CM

## 2015-06-20 DIAGNOSIS — Z79899 Other long term (current) drug therapy: Secondary | ICD-10-CM

## 2015-06-20 DIAGNOSIS — R634 Abnormal weight loss: Secondary | ICD-10-CM | POA: Diagnosis not present

## 2015-06-20 LAB — URINALYSIS, DIPSTICK ONLY
Bilirubin Urine: NEGATIVE
GLUCOSE, UA: NEGATIVE mg/dL
Hgb urine dipstick: NEGATIVE
Ketones, ur: NEGATIVE mg/dL
LEUKOCYTES UA: NEGATIVE
Nitrite: NEGATIVE
PROTEIN: NEGATIVE mg/dL
Specific Gravity, Urine: 1.01 (ref 1.005–1.030)
UROBILINOGEN UA: 0.2 mg/dL (ref 0.0–1.0)
pH: 5.5 (ref 5.0–8.0)

## 2015-06-20 MED ORDER — HEPARIN SOD (PORK) LOCK FLUSH 100 UNIT/ML IV SOLN
500.0000 [IU] | Freq: Once | INTRAVENOUS | Status: AC | PRN
  Administered 2015-06-20: 500 [IU]

## 2015-06-20 MED ORDER — SODIUM CHLORIDE 0.9 % IV SOLN
10.0000 mg/kg | Freq: Once | INTRAVENOUS | Status: AC
  Administered 2015-06-20: 725 mg via INTRAVENOUS
  Filled 2015-06-20: qty 29

## 2015-06-20 MED ORDER — SODIUM CHLORIDE 0.9 % IV SOLN
Freq: Once | INTRAVENOUS | Status: AC
  Administered 2015-06-20: 10:00:00 via INTRAVENOUS

## 2015-06-20 MED ORDER — OXYCODONE HCL 5 MG PO CAPS: 5.0000 mg | ORAL_CAPSULE | ORAL | Status: DC | PRN

## 2015-06-20 MED ORDER — SODIUM CHLORIDE 0.9 % IJ SOLN: 10.0000 mL | INTRAMUSCULAR | Status: DC | PRN

## 2015-06-20 MED ORDER — SODIUM CHLORIDE 0.9 % IV SOLN: 10.0000 mg/kg | Freq: Once | INTRAVENOUS | Status: DC

## 2015-06-20 MED ORDER — ALPRAZOLAM 1 MG PO TABS: 1.0000 mg | ORAL_TABLET | Freq: Three times a day (TID) | ORAL | Status: DC | PRN

## 2015-06-20 MED ORDER — HEPARIN SOD (PORK) LOCK FLUSH 100 UNIT/ML IV SOLN
INTRAVENOUS | Status: AC
  Filled 2015-06-20: qty 5

## 2015-06-20 NOTE — Progress Notes (Signed)
Cody Hampshire, [place] [address]   Glioblastoma  CURRENT THERAPY:  04/30/15: Disease progression. Discontinue Irinotecan. Initiate Carboplatin AUC 4 IV every 4 weeks with continued concurrent Avastin 10 mgkg IV every 2 weeks.   INTERVAL HISTORY: Cody Buchanan 59 y.o. male returns for followup of glioblastoma.   He is here alone and doing well today. He feels he has good days and bad days. Physically he is doing well with just a bit of back pain that he attributes to muscle strain. His eating habits have been the same with the addition of Ensure. In the mornings he drinks an Ensure with eggs, sausage, toast, and a piece of fruit. Lunch is typically a sandwich and chips. Since March he has lost almost 20 pounds.  Dinner is a homecooked meal, recently having pork chops and vegetables. He feels like his appetite has increased with the Ensure. His appetite was previously affected by the death of his father.    He'll be returning to [facility] on Monday 6/27 for imaging. He has not experienced any seizures, stumbling, or falling.  He doesn't feel as though he needs anti depressants, although he adamantly admits to not dealing well with his father's death.   Oncology History   03-10-14 Partial resection, [name], [place], Dr. [name] 04/07/2014 Re-resection at [place], Dr. [name]     Malignant brain tumor, left frontal glioblastoma multiforme grade 4   03-10-14 Initial Diagnosis Malignant brain tumor, left frontal glioblastoma multiforme grade 4, [place], [place]. partial resection, Dr. [name]   04/07/2014 Surgery Re-resection at [place], Dr. Derrill Memo   05/10/2014 - 06/21/2014 Radiation Therapy By Dr. Sondra Come with 6000 cGy in 30 fractions to left frontal brain   07/07/2014 -  Chemotherapy Temodar x5 days every 4 weeks for 2 cycles +2 infusions of patient's own lymphocytes.   08/23/2014 Progression MRI showed  progression at [facility]   09/27/2014 - 04/18/2015 Chemotherapy Avastin started with irinotecan added on 10/11/2014    12/06/2014 Imaging MRI brain at [facility]- Decreased enhancement, edema, and mass effect. This may represent pseudoresponse related to interval chemotherapy versus true response.   02/07/2015 Imaging MRI brain at [facility]- Increasing nodular enhancement along the medial aspect of the left frontalresection cavity.   04/04/2015 Progression MRI brain at [facility]- Slight interval increase in nodular enhancement within the genu of thecorpus callosum and increased size of a nodular focus of enhancement medialto the resection cavity.    05/09/2015 - 06/06/2015 Chemotherapy Carboplatin AUC 4 Q 28 days, AVASTIN 10 mg/kg q 2 weeks   06/25/2015 Progression MRI brain at [facility]- Significant interval increase in size of a peripheral enhancing mass at theposterior aspect of the resection site with elements of diffusionrestriction which may represent hypercellular tumor. Consistent withdisease progression.   06/25/2015 -  Chemotherapy Etoposide PO 50 mg/m2/day on days 1-21 every 28 days.  Avastin every 2 weeks continued.  (He started Etoposide as dated before he was advised to and he only was taking 50 mg daily).    Past Medical History  Diagnosis Date  . Glioblastoma   . GERD (gastroesophageal reflux disease)   . Seizures   . Anxiety 09/07/2014    has Malignant brain tumor, left frontal glioblastoma multiforme grade 4; Chronic obstructive pulmonary disease; Seizure disorder; Allergic rhinitis; Acute gouty arthritis; Anxiety; Brain tumor; Altered mental status; and Cocaine abuse on his problem list.     has No Known  Allergies.  Mr. Caylor had no medications administered during this visit.  Past Surgical History  Procedure Laterality Date  . Salivary gland surgery    . Brain tumor excision     Father died at 32 yo from congestive heart failure recently, he had diabetes, high potassium, and was restricted to a  hover-round.  Mother is living, he lives with her and takes care of her.  Denies any headaches, dizziness, double vision, fevers, chills, night sweats, nausea, vomiting, diarrhea, constipation, chest pain, heart palpitations, shortness of breath, blood in stool, black tarry stool, urinary pain, urinary burning, urinary frequency, hematuria. Positive for weight loss and appetite loss. Positive for ongoing grieving his father's death 77 point review of systems was performed and is negative except as detailed under history of present illness and above  PHYSICAL EXAMINATION ECOG PERFORMANCE STATUS: 1 - Symptomatic but completely ambulatory  Filed Vitals:   06/20/15 0858  BP: 119/83  Pulse: 84  Temp: 98 F (36.7 C)  Resp: 18    GENERAL:alert, no distress, well nourished, well developed, comfortable, cooperative Obvious weight loss SKIN: skin color, texture, turgor are normal, no rashes or significant lesions HEAD: Normocephalic, No masses, lesions, tenderness or abnormalities EYES: normal, PERRLA, EOMI, Conjunctiva are pink and non-injected EARS: External ears normal OROPHARYNX:lips, buccal mucosa, and tongue normal and mucous membranes are moist  NECK: supple, no adenopathy, thyroid normal size, non-tender, without nodularity, no stridor, non-tender, trachea midline LYMPH:  no palpable lymphadenopathy BREAST:not examined LUNGS: clear to auscultation  HEART: regular rate & rhythm ABDOMEN:abdomen soft, non-tender and normal bowel sounds BACK: Back symmetric, no curvature., No CVA tenderness EXTREMITIES:less then 2 second capillary refill, no joint deformities, effusion, or inflammation, no skin discoloration, no cyanosis  NEURO: alert & oriented x 3 with fluent speech, no focal motor/sensory deficits, gait normal   LABORATORY DATA: CBC    Component Value Date/Time   WBC 8.7 07/14/2015 0634   RBC 4.69 07/14/2015 0634   HGB 14.0 07/14/2015 0634   HCT 41.6 07/14/2015 0634   PLT  231 07/14/2015 0634   MCV 88.7 07/14/2015 0634   MCH 29.9 07/14/2015 0634   MCHC 33.7 07/14/2015 0634   RDW 15.7* 07/14/2015 0634   LYMPHSABS 0.6* 07/13/2015 1730   MONOABS 0.5 07/13/2015 1730   EOSABS 0.0 07/13/2015 1730   BASOSABS 0.0 07/13/2015 1730      Chemistry      Component Value Date/Time   NA 139 07/14/2015 0634   K 4.1 07/14/2015 0634   CL 109 07/14/2015 0634   CO2 24 07/14/2015 0634   BUN 17 07/14/2015 0634   CREATININE 0.67 07/14/2015 0634      Component Value Date/Time   CALCIUM 8.8* 07/14/2015 0634   ALKPHOS 36* 07/14/2015 0634   AST 15 07/14/2015 0634   ALT 10* 07/14/2015 0634   BILITOT 0.8 07/14/2015 0634     RADIOLOGY:   MRI BRAIN WITHOUT AND WITH CONTRAST   INDICATION: C71.1 Malignant neoplasm of frontal lobe, Evaluate for tumor progression provided. .  COMPARISON: Brain MRI dated 04/02/2015, 02/07/2015  TECHNIQUE/PROTOCOL: Standard adult brain protocol.   FINDINGS: Patient status post left frontal craniotomy. Underlying resection cavity within the left frontal lobe demonstrates irregular, nodular areas of enhancement most notably within the medial aspect of the resection cavity. There is increased nodular enhancement of the left aspect of the genu of the corpus callosum (series 11, image 122). The size of the nodule also appears larger measuring 1.7 x 1.7 x 1.4 cm (  series 11 image 118; series 12, image 151), previously 1.4 x 1.7 x 1.0 cm (remeasured). Areas of restricted diffusion may represent necrotic tissue. Areas of increased FLAIR signal around the bilateral centrum semiovale and subinsular white matter appear similar to prior. There is no acute cortical infarct, intracranial hemorrhage, new mass, or mass-effect. The extra-axial spaces, basal cisterns, ventricles and sulci are normal. Intracranial flow-voids appear normal. The orbits, cranium, mastoid sinuses, and visualized paranasal sinuses are unremarkable.  BTC MRI Interpretation: MRI  from 04/30/15 was compared to MRI from 04/02/15 by Davonna Belling, MD and Lenon Ahmadi, AGNP using Actual scan. Findings included increased FLAIR and enhancement across the genu of the corpus callosum and and are consistent with Progressive radiographic disease.   IMPRESSION: Mildly increased nodular enhancement and FLAIR signal within the genu of the corpus callosum and along the medial aspect of the resection cavity compared to April MRI, but more definitely increased compared to February MRI. Findings are concerning for disease progression.   Electronically Reviewed by: Eldridge Scot, MD Electronically Reviewed on: 04/30/2015 11:00 AM   ASSESSMENT AND PLAN:  Glioblastoma Recent progression on MRI at [facility] in therapy on 05/09/2015 Grief Weight loss  I again spent time discussing the death of his father and my concerns for the patient given his own health and struggles. He is not willing to consider an antidepressant at this time. I discussed his weight loss and my concerns in regards to that as well.  He will continue forward with Carboplatin every 4 weeks. Avastin every 2 weeks. He has done well with new therapy. Follow up in one week with Kirby Crigler PA. I feel he needs close f/u currently.  I will refill his pain medication as requested.   All questions were answered. The patient knows to call the clinic with any problems, questions or concerns. We can certainly see the patient much sooner if necessary.  This document serves as a record of services personally performed by Ancil Linsey, MD. It was created on her behalf by Arlyce Harman, a trained medical scribe. The creation of this record is based on the scribe's personal observations and the provider's statements to them. This document has been checked and approved by the attending provider.  I have reviewed the above documentation for accuracy and completeness, and I agree with the above.  Molli Hazard, MD

## 2015-06-20 NOTE — Patient Instructions (Signed)
[facility] Discharge Instructions for Patients Receiving Chemotherapy  Today you received the following chemotherapy agents Avastin.  To help prevent nausea and vomiting after your treatment, we encourage you to take your nausea medication as instructed. If you develop nausea and vomiting that is not controlled by your nausea medication, call the clinic. If it is after clinic hours your family physician or the after hours number for the clinic or go to the Emergency Department. BELOW ARE SYMPTOMS THAT SHOULD BE REPORTED IMMEDIATELY:  *FEVER GREATER THAN 101.0 F  *CHILLS WITH OR WITHOUT FEVER  NAUSEA AND VOMITING THAT IS NOT CONTROLLED WITH YOUR NAUSEA MEDICATION  *UNUSUAL SHORTNESS OF BREATH  *UNUSUAL BRUISING OR BLEEDING  TENDERNESS IN MOUTH AND THROAT WITH OR WITHOUT PRESENCE OF ULCERS  *URINARY PROBLEMS  *BOWEL PROBLEMS  UNUSUAL RASH Items with * indicate a potential emergency and should be followed up as soon as possible.  Return in 2 weeks as scheduled.  I have been informed and understand all the instructions given to me. I know to contact the clinic, my physician, or go to the Emergency Department if any problems should occur. I do not have any questions at this time, but understand that I may call the clinic during office hours or the Patient Navigator at [phone] should I have any questions or need assistance in obtaining follow up care.    __________________________________________  _____________  __________ Signature of Patient or Authorized Representative            Date                   Time    __________________________________________ Nurse's Signature

## 2015-06-20 NOTE — Progress Notes (Signed)
Tolerated Avastin infusion well.

## 2015-06-20 NOTE — Progress Notes (Signed)
[place] Clinical Social Work  Clinical Social Work was referred by [facility] rounding for re-assessment of psychosocial needs due to previous needs.  Clinical Social Worker met with patient at [facility] to offer support and assess for needs.  Pt currently denies any new concerns currently. He appreciated visit from [place] today.    Loren Racer, [place] Tuesdays 8:30-1pm Wednesdays 8:30-12pm  [phone]

## 2015-06-20 NOTE — Patient Instructions (Signed)
..  [place] at [facility] Discharge Instructions  RECOMMENDATIONS MADE BY THE CONSULTANT AND ANY TEST RESULTS WILL BE SENT TO YOUR REFERRING PHYSICIAN.  Exam today per Dr. Whitney Muse Return in 2 weeks, appointment with Gershon Mussel at that time  Thank you for choosing [place] at [facility] to provide your oncology and hematology care.  To afford each patient quality time with our provider, please arrive at least 15 minutes before your scheduled appointment time.    You need to re-schedule your appointment should you arrive 10 or more minutes late.  We strive to give you quality time with our providers, and arriving late affects you and other patients whose appointments are after yours.  Also, if you no show three or more times for appointments you may be dismissed from the clinic at the providers discretion.     Again, thank you for choosing [facility].  Our hope is that these requests will decrease the amount of time that you wait before being seen by our physicians.       _____________________________________________________________  Should you have questions after your visit to [facility], please contact our office at [phone] between the hours of 8:30 a.m. and 4:30 p.m.  Voicemails left after 4:30 p.m. will not be returned until the following business day.  For prescription refill requests, have your pharmacy contact our office.

## 2015-06-25 ENCOUNTER — Other Ambulatory Visit (HOSPITAL_COMMUNITY): Payer: Self-pay | Admitting: Oncology

## 2015-06-25 ENCOUNTER — Telehealth (HOSPITAL_COMMUNITY): Payer: Self-pay | Admitting: Oncology

## 2015-06-25 DIAGNOSIS — C719 Malignant neoplasm of brain, unspecified: Secondary | ICD-10-CM

## 2015-06-25 MED ORDER — ETOPOSIDE 50 MG PO CAPS: ORAL_CAPSULE | ORAL | Status: DC

## 2015-06-25 NOTE — Telephone Encounter (Signed)
Cody Reichmann, NP from [facility] Brain clinic called.  Unfortunately, Cody Buchanan demonstrates progression of disease at the [facility].  Therefore, it is recommended to continue Avastin at current dose, D/C Carboplatin, and pursue Etoposide (full dose) at 50 mg/m2/day on days 1- 21 every 28 days (beginning 4 weeks out from previous Carboplatin).  Rx printed for Etoposide.  Labs weekly: CBC diff, CMET  Patient and plan discussed with Dr. Ancil Buchanan and she is in agreement with the aforementioned.   Cody Buchanan 06/25/2015 10:55 AM

## 2015-06-25 NOTE — Patient Instructions (Addendum)
Cody Buchanan   CHEMOTHERAPY INSTRUCTIONS  Avastin - this medication is considered an anti-angiogenic therapy. Avastin is thought to work by causing the blood vessels to shrink away from the tumor, blocking the supply of oxygen and nutrients that the tumor needs to grow. Avastin may also cause the existing blood vessels to change in ways that help the chemotherapy reach the tumor more effectively. Avastin may also work to interfere with the growth of new blood vessels, causing the tumor to starve. Side Effects: Nosebleeds, High Blood Pressure, Protein in Urine, & diarrhea.  (take every 14 days @ [facility])  VP16 (Etoposide) - Bone marrow suppression (lowers white blood cells (fight infection), lowers red blood cells (make up your blood), lowers platelets (help blood to clot). Hair loss, loss of appetite, nausea/vomiting. (you will take this medication once a day for 21 days in a row then no medication x 7 days, Repeat cycle)  We will have you come weekly for blood work. So on the weeks you are not getting Avastin, you will need to come for blood work.  EDUCATIONAL MATERIALS GIVEN AND REVIEWED: Specific Instructions Sheets: Etoposide, Avastin   SELF CARE ACTIVITIES WHILE ON CHEMOTHERAPY: Increase your fluid intake 48 hours prior to treatment and drink at least 2 quarts per day after treatment., No alcohol intake., No aspirin or other medications unless approved by your oncologist., Eat foods that are light and easy to digest., Eat foods at cold or room temperature., No fried, fatty, or spicy foods immediately before or after treatment., Have teeth cleaned professionally before starting treatment. Keep dentures and partial plates clean., Use soft toothbrush and do not use mouthwashes that contain alcohol. Biotene is a good mouthwash that is available at most pharmacies or may be ordered by calling [phone]., Use warm salt water gargles (1 teaspoon salt per 1  quart warm water) before and after meals and at bedtime. Or you may rinse with 2 tablespoons of three -percent hydrogen peroxide mixed in eight ounces of water., Always use sunscreen with SPF (Sun Protection Factor) of 30 or higher., Use your nausea medication as directed to prevent nausea., Use your stool softener or laxative as directed to prevent constipation. and Use your anti-diarrheal medication as directed to stop diarrhea.  Please wash your hands for at least 30 seconds using warm soapy water. Handwashing is the #1 way to prevent the spread of germs. Stay away from sick people or people who are getting over a cold. If you develop respiratory systems such as green/yellow mucus production or productive cough or persistent cough let us know and we will see if you need an antibiotic. It is a good idea to keep a pair of gloves on when going into grocery stores/[employer] to decrease your risk of coming into contact with germs on the carts, etc. Carry alcohol hand gel with you at all times and use it frequently if out in public. All foods need to be cooked thoroughly. No raw foods. No medium or undercooked meats, eggs. If your food is cooked medium well, it does not need to be hot pink or saturated with bloody liquid at all. Vegetables and fruits need to be washed/rinsed under the faucet with a dish detergent before being consumed. You can eat raw fruits and vegetables unless we tell you otherwise but it would be best if you cooked them or bought frozen. Do not eat off of salad bars or hot bars unless you really trust the  cleanliness of the restaurant. If you need dental work, please let Dr. Whitney Muse know before you go for your appointment so that we can coordinate the best possible time for you in regards to your chemo regimen. You need to also let your dentist know that you are actively taking chemo. We may need to do labs prior to your dental appointment. We also want your bowels moving at least every other day.  If this is not happening, we need to know so that we can get you on a bowel regimen to help you go.       MEDICATIONS: You have been given prescriptions for the following medications:  Zofran 8mg  tablet. Take 1 tablet every 8 hours as needed for nausea/vomiting. (#1 nausea med to take, this can constipate)  Compazine 10mg  tablet. Take 1 tablet every 6 hours as needed for nausea/vomiting. (#2 nausea med to take, this can make you sleepy)  Over-the-Counter Meds:  Miralax 1 capful in 8 oz of fluid daily. May increase to two times a day if needed. This is a stool softener. If this doesn't work proceed you can add:  Senokot S  - start with 1 tablet two times a day and increase to 4 tablets two times a day if needed. (total of 8 tablets in a 24 hour period). This is a stimulant laxative.   Call us if this does not help your bowels move.   Imodium 2mg  capsule. Take 2 capsules after the 1st loose stool and then 1 capsule every 2 hours until you go a total of 12 hours without having a loose stool. Call the [place] if loose stools continue.   SYMPTOMS TO REPORT AS SOON AS POSSIBLE AFTER TREATMENT:  FEVER GREATER THAN 100.5 F  CHILLS WITH OR WITHOUT FEVER  NAUSEA AND VOMITING THAT IS NOT CONTROLLED WITH YOUR NAUSEA MEDICATION  UNUSUAL SHORTNESS OF BREATH  UNUSUAL BRUISING OR BLEEDING  TENDERNESS IN MOUTH AND THROAT WITH OR WITHOUT PRESENCE OF ULCERS  URINARY PROBLEMS  BOWEL PROBLEMS  UNUSUAL RASH    Wear comfortable clothing and clothing appropriate for easy access to any Portacath or PICC line. Let us know if there is anything that we can do to make your therapy better!      I have been informed and understand all of the instructions given to me and have received a copy. I have been instructed to call the clinic [phone] or my family physician as soon as possible for continued medical care, if indicated. I do not have any more questions at this time but understand  that I may call the [facility] or the Patient Navigator at [phone] during office hours should I have questions or need assistance in obtaining follow-up care.           Etoposide, VP-16 capsules What is this medicine? ETOPOSIDE, VP-16 (e toe POE side) is a chemotherapy drug. It is used to treat small cell lung cancer and other cancers. This medicine may be used for other purposes; ask your health care provider or pharmacist if you have questions. COMMON BRAND NAME(S): VePesid What should I tell my health care provider before I take this medicine? They need to know if you have any of these conditions: -infection -kidney disease -low blood counts, like low white cell, platelet, or red cell counts -an unusual or allergic reaction to etoposide, other chemotherapeutic agents, other medicines, foods, dyes, or preservatives -pregnant or trying to get pregnant -breast-feeding How should I  use this medicine? Take this medicine by mouth with a glass of water. Follow the directions on the prescription label. Do not open, crush, or chew the capsules. It is advisable to wear gloves when handling this medicine. Take your medicine at regular intervals. Do not take it more often than directed. Do not stop taking except on your doctor's advice. Talk to your pediatrician regarding the use of this medicine in children. Special care may be needed. Overdosage: If you think you have taken too much of this medicine contact a poison control center or emergency room at once. NOTE: This medicine is only for you. Do not share this medicine with others. What if I miss a dose? If you miss a dose, take it as soon as you can. If it is almost time for your next dose, take only that dose. Do not take double or extra doses. What may interact with this medicine? -cyclosporine -medicines to increase blood counts like filgrastim, pegfilgrastim, sargramostim -vaccines This list may not describe all possible  interactions. Give your health care provider a list of all the medicines, herbs, non-prescription drugs, or dietary supplements you use. Also tell them if you smoke, drink alcohol, or use illegal drugs. Some items may interact with your medicine. What should I watch for while using this medicine? Visit your doctor for checks on your progress. This drug may make you feel generally unwell. This is not uncommon, as chemotherapy can affect healthy cells as well as cancer cells. Report any side effects. Continue your course of treatment even though you feel ill unless your doctor tells you to stop. In some cases, you may be given additional medicines to help with side effects. Follow all directions for their use. Call your doctor or health care professional for advice if you get a fever, chills or sore throat, or other symptoms of a cold or flu. Do not treat yourself. This drug decreases your body's ability to fight infections. Try to avoid being around people who are sick. This medicine may increase your risk to bruise or bleed. Call your doctor or health care professional if you notice any unusual bleeding. Be careful brushing and flossing your teeth or using a toothpick because you may get an infection or bleed more easily. If you have any dental work done, tell your dentist you are receiving this medicine. Avoid taking products that contain aspirin, acetaminophen, ibuprofen, naproxen, or ketoprofen unless instructed by your doctor. These medicines may hide a fever. Do not become pregnant while taking this medicine. Women should inform their doctor if they wish to become pregnant or think they might be pregnant. There is a potential for serious side effects to an unborn child. Talk to your health care professional or pharmacist for more information. Do not breast-feed an infant while taking this medicine. What side effects may I notice from receiving this medicine? Side effects that you should report to your  doctor or health care professional as soon as possible: -allergic reactions like skin rash, itching or hives, swelling of the face, lips, or tongue -low blood counts - this medicine may decrease the number of white blood cells, red blood cells and platelets. You may be at increased risk for infections and bleeding. -signs of infection - fever or chills, cough, sore throat, pain or difficulty passing urine -signs of decreased platelets or bleeding - bruising, pinpoint red spots on the skin, black, tarry stools, blood in the urine -signs of decreased red blood cells - unusually weak  or tired, fainting spells, lightheadedness -breathing problems -changes in vision -mouth or throat sores or ulcers -pain, tingling, numbness in the hands or feet -redness, blistering, peeling or loosening of the skin, including inside the mouth -seizures -vomiting Side effects that usually do not require medical attention (report to your doctor or health care professional if they continue or are bothersome): -change in taste -diarrhea -hair loss -nausea -stomach pain This list may not describe all possible side effects. Call your doctor for medical advice about side effects. You may report side effects to FDA at 1-800-FDA-1088. Where should I keep my medicine? Keep out of the reach of children. Store in a refrigerator between 2 and 8 degrees C (36 and 46 degrees F). Do not freeze. Throw away any unused medicine after the expiration date. NOTE: This sheet is a summary. It may not cover all possible information. If you have questions about this medicine, talk to your doctor, pharmacist, or health care provider.  2015, Elsevier/Gold Standard. (2008-04-17 17:22:30) Bevacizumab injection What is this medicine? BEVACIZUMAB (be va SIZ yoo mab) is a chemotherapy drug. It targets a protein found in many cancer cell types, and halts cancer growth. This drug treats many cancers including non-small cell lung cancer, ovarian  cancer, cervical cancer, and colon or rectal cancer. It is usually given with other chemotherapy drugs. This medicine may be used for other purposes; ask your health care provider or pharmacist if you have questions. COMMON BRAND NAME(S): Avastin What should I tell my health care provider before I take this medicine? They need to know if you have any of these conditions: -blood clots -heart disease, including heart failure, heart attack, or chest pain (angina) -high blood pressure -infection (especially a virus infection such as chickenpox, cold sores, or herpes) -kidney disease -lung disease -prior chemotherapy with doxorubicin, daunorubicin, epirubicin, or other anthracycline type chemotherapy agents -recent or ongoing radiation therapy -recent surgery -stroke -an unusual or allergic reaction to bevacizumab, hamster proteins, mouse proteins, other medicines, foods, dyes, or preservatives -pregnant or trying to get pregnant -breast-feeding How should I use this medicine? This medicine is for infusion into a vein. It is given by a health care professional in a hospital or clinic setting. Talk to your pediatrician regarding the use of this medicine in children. Special care may be needed. Overdosage: If you think you have taken too much of this medicine contact a poison control center or emergency room at once. NOTE: This medicine is only for you. Do not share this medicine with others. What if I miss a dose? It is important not to miss your dose. Call your doctor or health care professional if you are unable to keep an appointment. What may interact with this medicine? Interactions are not expected. This list may not describe all possible interactions. Give your health care provider a list of all the medicines, herbs, non-prescription drugs, or dietary supplements you use. Also tell them if you smoke, drink alcohol, or use illegal drugs. Some items may interact with your medicine. What  should I watch for while using this medicine? Your condition will be monitored carefully while you are receiving this medicine. You will need important blood work and urine testing done while you are taking this medicine. During your treatment, let your health care professional know if you have any unusual symptoms, such as difficulty breathing. This medicine may rarely cause 'gastrointestinal perforation' (holes in the stomach, intestines or colon), a serious side effect requiring surgery to repair. This medicine  should be started at least 28 days following major surgery and the site of the surgery should be totally healed. Check with your doctor before scheduling dental work or surgery while you are receiving this treatment. Talk to your doctor if you have recently had surgery or if you have a wound that has not healed. Do not become pregnant while taking this medicine. Women should inform their doctor if they wish to become pregnant or think they might be pregnant. There is a potential for serious side effects to an unborn child. Talk to your health care professional or pharmacist for more information. Do not breast-feed an infant while taking this medicine. This medicine has caused ovarian failure in some women. This medicine may interfere with the ability to have a child. You should talk to your doctor or health care professional if you are concerned about your fertility. What side effects may I notice from receiving this medicine? Side effects that you should report to your doctor or health care professional as soon as possible: -allergic reactions like skin rash, itching or hives, swelling of the face, lips, or tongue -signs of infection - fever or chills, cough, sore throat, pain or trouble passing urine -signs of decreased platelets or bleeding - bruising, pinpoint red spots on the skin, black, tarry stools, nosebleeds, blood in the urine -breathing problems -changes in vision -chest  pain -confusion -jaw pain, especially after dental work -mouth sores -seizures -severe abdominal pain -severe headache -sudden numbness or weakness of the face, arm or leg -swelling of legs or ankles -symptoms of a stroke: change in mental awareness, inability to talk or move one side of the body (especially in patients with lung cancer) -trouble passing urine or change in the amount of urine -trouble speaking or understanding -trouble walking, dizziness, loss of balance or coordination Side effects that usually do not require medical attention (report to your doctor or health care professional if they continue or are bothersome): -constipation -diarrhea -dry skin -headache -loss of appetite -nausea, vomiting This list may not describe all possible side effects. Call your doctor for medical advice about side effects. You may report side effects to FDA at 1-800-FDA-1088. Where should I keep my medicine? This drug is given in a hospital or clinic and will not be stored at home. NOTE: This sheet is a summary. It may not cover all possible information. If you have questions about this medicine, talk to your doctor, pharmacist, or health care provider.  2015, Elsevier/Gold Standard. (2013-11-15 11:38:34)

## 2015-06-27 ENCOUNTER — Inpatient Hospital Stay (HOSPITAL_COMMUNITY): Payer: Medicaid Other

## 2015-06-28 ENCOUNTER — Telehealth (HOSPITAL_COMMUNITY): Payer: Self-pay | Admitting: *Deleted

## 2015-06-28 NOTE — Telephone Encounter (Signed)
Patient received etoposide pills and started on 6/27. He states he received instructions from [facility] on this medication.

## 2015-07-01 NOTE — Progress Notes (Signed)
Per pt he received Etoposide pills on 06/25/15 and began taking them. He said [facility] instructed him on the pills. Consent signed for Etoposide pills today. Calendar given to patient.

## 2015-07-04 ENCOUNTER — Encounter (HOSPITAL_COMMUNITY): Payer: Self-pay | Admitting: Oncology

## 2015-07-04 ENCOUNTER — Encounter (HOSPITAL_COMMUNITY): Payer: Medicaid Other

## 2015-07-04 ENCOUNTER — Encounter (HOSPITAL_BASED_OUTPATIENT_CLINIC_OR_DEPARTMENT_OTHER): Payer: Medicaid Other | Admitting: Oncology

## 2015-07-04 ENCOUNTER — Encounter (HOSPITAL_COMMUNITY): Payer: Medicaid Other | Attending: Hematology and Oncology

## 2015-07-04 ENCOUNTER — Encounter: Payer: Self-pay | Admitting: *Deleted

## 2015-07-04 VITALS — BP 110/82 | HR 78 | Temp 98.2°F | Resp 16

## 2015-07-04 VITALS — BP 110/82 | HR 78 | Temp 98.2°F | Resp 18 | Wt 153.4 lb

## 2015-07-04 DIAGNOSIS — C711 Malignant neoplasm of frontal lobe: Secondary | ICD-10-CM

## 2015-07-04 DIAGNOSIS — Z79899 Other long term (current) drug therapy: Secondary | ICD-10-CM

## 2015-07-04 DIAGNOSIS — C719 Malignant neoplasm of brain, unspecified: Secondary | ICD-10-CM

## 2015-07-04 DIAGNOSIS — R634 Abnormal weight loss: Secondary | ICD-10-CM

## 2015-07-04 DIAGNOSIS — Z5112 Encounter for antineoplastic immunotherapy: Secondary | ICD-10-CM | POA: Diagnosis present

## 2015-07-04 LAB — URINALYSIS, DIPSTICK ONLY
Bilirubin Urine: NEGATIVE
Glucose, UA: NEGATIVE mg/dL
Hgb urine dipstick: NEGATIVE
Ketones, ur: NEGATIVE mg/dL
LEUKOCYTES UA: NEGATIVE
Nitrite: NEGATIVE
Protein, ur: NEGATIVE mg/dL
SPECIFIC GRAVITY, URINE: 1.025 (ref 1.005–1.030)
Urobilinogen, UA: 0.2 mg/dL (ref 0.0–1.0)
pH: 5.5 (ref 5.0–8.0)

## 2015-07-04 LAB — COMPREHENSIVE METABOLIC PANEL
ALK PHOS: 42 U/L (ref 38–126)
ALT: 11 U/L — ABNORMAL LOW (ref 17–63)
ANION GAP: 7 (ref 5–15)
AST: 16 U/L (ref 15–41)
Albumin: 3.7 g/dL (ref 3.5–5.0)
BILIRUBIN TOTAL: 1 mg/dL (ref 0.3–1.2)
BUN: 19 mg/dL (ref 6–20)
CHLORIDE: 104 mmol/L (ref 101–111)
CO2: 25 mmol/L (ref 22–32)
Calcium: 8.7 mg/dL — ABNORMAL LOW (ref 8.9–10.3)
Creatinine, Ser: 1.2 mg/dL (ref 0.61–1.24)
GFR calc non Af Amer: >60 mL/min (ref >60)
Glucose, Bld: 106 mg/dL — ABNORMAL HIGH (ref 65–99)
Potassium: 4.1 mmol/L (ref 3.5–5.1)
Sodium: 136 mmol/L (ref 135–145)
Total Protein: 7.1 g/dL (ref 6.5–8.1)

## 2015-07-04 LAB — CBC WITH DIFFERENTIAL/PLATELET
Basophils Absolute: 0 10*3/uL (ref 0.0–0.1)
Basophils Relative: 0 % (ref 0–1)
EOS ABS: 0.2 10*3/uL (ref 0.0–0.7)
Eosinophils Relative: 2 % (ref 0–5)
HEMATOCRIT: 43.6 % (ref 39.0–52.0)
HEMOGLOBIN: 14.8 g/dL (ref 13.0–17.0)
LYMPHS PCT: 17 % (ref 12–46)
Lymphs Abs: 1.7 10*3/uL (ref 0.7–4.0)
MCH: 30.2 pg (ref 26.0–34.0)
MCHC: 33.9 g/dL (ref 30.0–36.0)
MCV: 89 fL (ref 78.0–100.0)
MONOS PCT: 3 % (ref 3–12)
Monocytes Absolute: 0.3 10*3/uL (ref 0.1–1.0)
Neutro Abs: 7.8 10*3/uL — ABNORMAL HIGH (ref 1.7–7.7)
Neutrophils Relative %: 78 % — ABNORMAL HIGH (ref 43–77)
PLATELETS: 188 10*3/uL (ref 150–400)
RBC: 4.9 MIL/uL (ref 4.22–5.81)
RDW: 15.6 % — ABNORMAL HIGH (ref 11.5–15.5)
WBC: 10 10*3/uL (ref 4.0–10.5)

## 2015-07-04 MED ORDER — ALPRAZOLAM 1 MG PO TABS: 1.0000 mg | ORAL_TABLET | Freq: Three times a day (TID) | ORAL | Status: DC | PRN

## 2015-07-04 MED ORDER — OXYCODONE HCL 5 MG PO CAPS: 5.0000 mg | ORAL_CAPSULE | ORAL | Status: DC | PRN

## 2015-07-04 MED ORDER — HEPARIN SOD (PORK) LOCK FLUSH 100 UNIT/ML IV SOLN
500.0000 [IU] | Freq: Once | INTRAVENOUS | Status: AC | PRN
  Administered 2015-07-04: 500 [IU]

## 2015-07-04 MED ORDER — SODIUM CHLORIDE 0.9 % IJ SOLN: 10.0000 mL | INTRAMUSCULAR | Status: DC | PRN

## 2015-07-04 MED ORDER — HEPARIN SOD (PORK) LOCK FLUSH 100 UNIT/ML IV SOLN
INTRAVENOUS | Status: AC
  Filled 2015-07-04: qty 5

## 2015-07-04 MED ORDER — BEVACIZUMAB CHEMO INJECTION 400 MG/16ML
700.0000 mg | Freq: Once | INTRAVENOUS | Status: AC
  Administered 2015-07-04: 700 mg via INTRAVENOUS
  Filled 2015-07-04: qty 24

## 2015-07-04 MED ORDER — SODIUM CHLORIDE 0.9 % IV SOLN
Freq: Once | INTRAVENOUS | Status: AC
  Administered 2015-07-04: 12:00:00 via INTRAVENOUS

## 2015-07-04 NOTE — Progress Notes (Signed)
Patient tolerated infusion well.

## 2015-07-04 NOTE — Assessment & Plan Note (Addendum)
With recent progression of disease.  Change in therapy by Jarrett Soho to Etoposide 50 mg/m2/day and continuation of current Avastin dose/schedule.  He started on 6/27 (we had planned on him starting today with instructions) taking Etoposide 50 mg daily.  He is taking the medication in the evening.  He denies any side effects of toxicities at this time.    He will start tonight taking Etoposide 100 mg daily.  He will complete his 3 weeks of treatment on 7/18.  He will have about 9 Etoposide capsules left over at that time.  His weight is declining.  He is to continue with Ensure supplementation.  I have encouraged him to increase his ensure to 2 bottles/cans daily.  I have recommended MEGACE therapy and we discussed the risks, benefits, alternatives, and side effects of MEGACE.  He wants to think about it and see if he can maintain or gain weight with increased ensure and improved PO intake.  I have printed information regarding Megace for him to have.  I briefly discussed goals of care.  He is hopefully that he remains a candidate for [facility] at [facility].  I will discuss code status at our next follow-up appointment.  Rx for Oxy IR and Xanax.  Education regarding Etoposide and correct dosing.    Return in 2 weeks for Avastin and follow-up.

## 2015-07-04 NOTE — Patient Instructions (Signed)
[facility] Discharge Instructions for Patients Receiving Chemotherapy  Today you received the following chemotherapy agents avastin.  To help prevent nausea and vomiting after your treatment, we encourage you to take your nausea medication Zofran 8mg  every 8 hours as needed for nausea or vomiting Begin taking it as needed and take it as often as prescribed for the next 48 hours.   If you develop nausea and vomiting, or diarrhea that is not controlled by your medication, call the clinic.  The clinic phone number is [phone]. Office hours are Monday-Friday 8:30am-5:00pm.  BELOW ARE SYMPTOMS THAT SHOULD BE REPORTED IMMEDIATELY:  *FEVER GREATER THAN 101.0 F  *CHILLS WITH OR WITHOUT FEVER  NAUSEA AND VOMITING THAT IS NOT CONTROLLED WITH YOUR NAUSEA MEDICATION  *UNUSUAL SHORTNESS OF BREATH  *UNUSUAL BRUISING OR BLEEDING  TENDERNESS IN MOUTH AND THROAT WITH OR WITHOUT PRESENCE OF ULCERS  *URINARY PROBLEMS  *BOWEL PROBLEMS  UNUSUAL RASH Items with * indicate a potential emergency and should be followed up as soon as possible. If you have an emergency after office hours please contact your primary care physician or go to the nearest emergency department.  Please call the clinic during office hours if you have any questions or concerns.   You may also contact the Patient Navigator at [phone] should you have any questions or need assistance in obtaining follow up care. _____________________________________________________________________ Have you asked about our STAR program?    STAR stands for Survivorship Training and Rehabilitation, and this is a nationally recognized cancer care program that focuses on survivorship and rehabilitation.  Cancer and cancer treatments may cause problems, such as, pain, making you feel tired and keeping you from doing the things that you need or want to do. Cancer rehabilitation can help. Our goal is to reduce these troubling  effects and help you have the best quality of life possible.  You may receive a survey from a nurse that asks questions about your current state of health.  Based on the survey results, all eligible patients will be referred to the [facility] program for an evaluation so we can better serve you! A frequently asked questions sheet is available upon request.

## 2015-07-04 NOTE — Patient Instructions (Addendum)
[place] at [facility]  Discharge Instructions:  You started you Etoposide (chemo-pills) on 06/25/2015.  Here is how you are to take these capsules:  Take 2 capsules daily for days 1- 21, followed by a 7 day week.  Therefore, since you started on 06/25/2015, you will take 2 capsules daily from 06/25/2015- 07/16/2015, then a 1 week break (7 days off)  When you started the capsules, you were only taking 1 capsule daily.  However, you are suppose to take 2 capsules daily.  As a result, starting tonight, take 2 capsules daily. Drink 1 - 2 cans of ensure daily Let us know if you want to try megace for your appetite. Pain medication and Xanax were refilled today. Report uncontrolled nausea, vomiting, or other concerns. Follow-up in 2 weeks. With chemotherapy and office visit.   _______________________________________________________________  Thank you for choosing [place] at [facility] to provide your oncology and hematology care.  To afford each patient quality time with our providers, please arrive at least 15 minutes before your scheduled appointment.  You need to re-schedule your appointment if you arrive 10 or more minutes late.  We strive to give you quality time with our providers, and arriving late affects you and other patients whose appointments are after yours.  Also, if you no show three or more times for appointments you may be dismissed from the clinic.  Again, thank you for choosing [place] at [place] hope is that these requests will allow you access to exceptional care and in a timely manner. _______________________________________________________________  If you have questions after your visit, please contact our office at [phone] between the hours of 8:30 a.m. and 5:00 p.m. Voicemails left after 4:30 p.m. will not be returned until the following business day.  _______________________________________________________________  For prescription refill requests, have your pharmacy contact our office. _______________________________________________________________  Recommendations made by the consultant and any test results will be sent to your referring physician. _______________________________________________________________

## 2015-07-04 NOTE — Progress Notes (Signed)
[place] Clinical Social Work  Clinical Social Work was referred by [facility] rounding for assessment of psychosocial needs due to past concerns.  Clinical Social Worker met with patient at [facility] in waiting room prior to his appointment to offer support and assess for needs.   Pt reports his mother has become more depressed since the death of his father. He reports he feels about the same. CSW discussed grief process and common emotions. CSW continues to suggest community resources to assist pt and family, but he has been resistant to most options to date. Pt continues to appear to lose weight. He states he still has Ensure at home that the dietician has provided him. CSW will continue to follow.    Clinical Social Work interventions: Supportive listening Grief education  Loren Racer, [place] Tuesdays 8:30-1pm Wednesdays 8:30-12pm  [phone]

## 2015-07-04 NOTE — Progress Notes (Signed)
Cody Hampshire, MD [place] [address]  Malignant brain tumor, left frontal glioblastoma multiforme grade 4 - Plan: ALPRAZolam (XANAX) 1 MG tablet, oxycodone (OXY-IR) 5 MG capsule  CURRENT THERAPY:Avastin and Etoposide 50 mg/m2/day on days 1-21 q 28days.  INTERVAL HISTORY: [facility] 59 y.o. male returns for followup of Glioblastoma.  Oncology History   03/03/2014 Partial resection, [facility], [place], [facility] 04/07/2014 Re-resection at [facility], [facility]     Malignant brain tumor, left frontal glioblastoma multiforme grade 4   03/03/2014 Initial Diagnosis Malignant brain tumor, left frontal glioblastoma multiforme grade 4, [facility]. partial resection, [facility]   04/07/2014 Surgery Re-resection at [facility], Dr. Derrill Memo   05/10/2014 - 06/21/2014 Radiation Therapy By [facility] with 6000 cGy in 30 fractions to left frontal brain   07/07/2014 -  Chemotherapy Temodar x5 days every 4 weeks for 2 cycles +2 infusions of patient's own lymphocytes.   08/23/2014 Progression MRI showed progression at [facility]   09/27/2014 - 04/18/2015 Chemotherapy Avastin started with irinotecan added on 10/11/2014    12/06/2014 Imaging MRI brain at [facility]- Decreased enhancement, edema, and mass effect. This may represent pseudoresponse related to interval chemotherapy versus true response.   02/07/2015 Imaging MRI brain at [facility] [place]- Increasing nodular enhancement along the medial aspect of the left frontalresection cavity.   04/04/2015 Progression MRI brain at [facility]- Slight interval increase in nodular enhancement within the genu of thecorpus callosum and increased size of a nodular focus of enhancement medialto the resection cavity.    05/09/2015 - 06/06/2015 Chemotherapy Carboplatin AUC 4 Q 28 days, AVASTIN 10 mg/kg q 2 weeks   06/25/2015 Progression MRI brain at [facility]- Significant interval increase in size of a peripheral enhancing mass  at theposterior aspect of the resection site with elements of diffusionrestriction which may represent hypercellular tumor. Consistent withdisease progression.   06/25/2015 -  Chemotherapy Etoposide PO 50 mg/m2/day on days 1-21 every 28 days.  Avastin every 2 weeks continued.  (He started Etoposide as dated before he was advised to and he only was taking 50 mg daily).    I personally reviewed and went over laboratory results with the patient.  The results are noted within this dictation.  I personally reviewed and went over radiographic studies with the patient.  The results are noted within this dictation.  Progression of disease is noted on MRI of brain at [facility].  Deloy, unfortunately, started Etoposide prior to today's visit.  I had planned on him starting it today with proper instructions.  He started Etoposide on 6/27 reporting "I was given directions at [facility]."  He was only taking 1 capsule (50 mg) daily in the evening.  I provided him education regarding the correct dosing of Etoposide.  He will take it in a 21/28 day schedule.  He will start taking the correct dose tonight.  His weight is dropping and this is noted.  He reports his appetite is up and down.  He is taking in 1 ensure daily.  I have encouraged him to increase to 2 ensure daily.  I discussed Megace therapy with him and he wants to think about this treatment option.  We will need to monitor his weight closely.  He is followed by nutritionist as well.  I briefly discussed goals of care which [facility] reports has not been discussed with him in the past.  He reports that he has been  informed that he remains a candidate for PSV- RIPO treatment at [facility].  His goal is to move on to that therapy if this regimen fails.  I broached the subject of what his goals are if he is no longer a candidate for PSV-RIPO treatment.  He will think about this.  I think next time, we will need to discuss code status.   Past Medical History  Diagnosis Date  .  Glioblastoma   . GERD (gastroesophageal reflux disease)   . Seizures   . Anxiety 09/07/2014    has Malignant brain tumor, left frontal glioblastoma multiforme grade 4; Chronic obstructive pulmonary disease; Seizure disorder; Allergic rhinitis; Acute gouty arthritis; and Anxiety on his problem list.     has No Known Allergies.  Current Outpatient Prescriptions on File Prior to Visit  Medication Sig Dispense Refill  . acetaminophen (TYLENOL) 325 MG tablet Take 650 mg by mouth every 6 (six) hours as needed for mild pain.     Marland Kitchen albuterol (PROVENTIL HFA;VENTOLIN HFA) 108 (90 BASE) MCG/ACT inhaler Inhale into the lungs.    . Bevacizumab (AVASTIN) 100 MG/4ML SOLN Inject into the vein every 14 (fourteen) days.     . bisacodyl (DULCOLAX) 5 MG EC tablet Take 5 mg by mouth daily as needed for moderate constipation.    Marland Kitchen etoposide (VEPESID) 50 MG capsule Take 100 mg (50 mg/m2/day) daily days 1-21 every 28 days. 42 capsule 0  . ibuprofen (ADVIL,MOTRIN) 200 MG tablet Take 400 mg by mouth every 6 (six) hours as needed for mild pain. Pain    . levETIRAcetam (KEPPRA) 1000 MG tablet Take 1 tablet (1,000 mg total) by mouth 2 (two) times daily. 60 tablet 1  . lidocaine-prilocaine (EMLA) cream Apply a quarter size amount to port site 1 hour prior to chemo. Do not rub in. Cover with plastic wrap. 30 g 3  . loperamide (IMODIUM) 2 MG capsule Take 2 mg by mouth as needed for diarrhea or loose stools. Take 2 tablets after first loose stool and then 1 tablet every 2 hours until you have gone 12 hours without a loose stool. If you develop loose stools during the night, take 2 tablets every 4 hours. Contact the [facility] for uncontrolled loose stools.    . Multiple Vitamins-Minerals (MULTIVITAMIN WITH MINERALS) tablet Take 1 tablet by mouth daily.    . ondansetron (ZOFRAN-ODT) 8 MG disintegrating tablet Take 8 mg by mouth every 8 (eight) hours as needed for nausea or vomiting.    . prochlorperazine (COMPAZINE) 10 MG  tablet Take 1 tablet (10 mg total) by mouth every 6 (six) hours as needed for nausea or vomiting. (Patient not taking: Reported on 05/16/2015) 30 tablet 1   Current Facility-Administered Medications on File Prior to Visit  Medication Dose Route Frequency Provider Last Rate Last Dose  . bevacizumab (AVASTIN) 700 mg in sodium chloride 0.9 % 100 mL chemo infusion  700 mg Intravenous Once Patrici Ranks, MD      . heparin lock flush 100 unit/mL  500 Units Intracatheter Once PRN Patrici Ranks, MD      . sodium chloride 0.9 % injection 10 mL  10 mL Intravenous PRN Patrici Ranks, MD   10 mL at 01/24/15 1114  . sodium chloride 0.9 % injection 10 mL  10 mL Intracatheter PRN Patrici Ranks, MD        Past Surgical History  Procedure Laterality Date  . Salivary gland surgery    . Brain tumor  excision      Denies any headaches, dizziness, double vision, fevers, chills, night sweats, nausea, vomiting, diarrhea, constipation, chest pain, heart palpitations, shortness of breath, blood in stool, black tarry stool, urinary pain, urinary burning, urinary frequency, hematuria.   PHYSICAL EXAMINATION  ECOG PERFORMANCE STATUS: 1 - Symptomatic but completely ambulatory  Filed Vitals:   07/04/15 1050  BP: 110/82  Pulse: 78  Temp: 98.2 F (36.8 C)  Resp: 18    GENERAL:alert, no distress, cachectic, comfortable, cooperative and smiling SKIN: skin color, texture, turgor are normal, no rashes or significant lesions HEAD: Normocephalic, No masses, lesions, tenderness or abnormalities EYES: normal, PERRLA, EOMI, Conjunctiva are pink and non-injected EARS: External ears normal OROPHARYNX:lips, buccal mucosa, and tongue normal and mucous membranes are moist  NECK: supple, trachea midline LYMPH:  no palpable lymphadenopathy BREAST:not examined LUNGS: clear to auscultation  HEART: regular rate & rhythm, no murmurs, no gallops, S1 normal and S2 normal ABDOMEN:abdomen soft, non-tender and normal  bowel sounds BACK: Back symmetric, no curvature., No CVA tenderness EXTREMITIES:less then 2 second capillary refill, no joint deformities, effusion, or inflammation, no skin discoloration, no cyanosis  NEURO: alert & oriented x 3 with fluent speech, no focal motor/sensory deficits, gait normal   LABORATORY DATA: CBC    Component Value Date/Time   WBC 10.0 07/04/2015 1052   RBC 4.90 07/04/2015 1052   HGB 14.8 07/04/2015 1052   HCT 43.6 07/04/2015 1052   PLT 188 07/04/2015 1052   MCV 89.0 07/04/2015 1052   MCH 30.2 07/04/2015 1052   MCHC 33.9 07/04/2015 1052   RDW 15.6* 07/04/2015 1052   LYMPHSABS 1.7 07/04/2015 1052   MONOABS 0.3 07/04/2015 1052   EOSABS 0.2 07/04/2015 1052   BASOSABS 0.0 07/04/2015 1052      Chemistry      Component Value Date/Time   NA 136 07/04/2015 1052   K 4.1 07/04/2015 1052   CL 104 07/04/2015 1052   CO2 25 07/04/2015 1052   BUN 19 07/04/2015 1052   CREATININE 1.20 07/04/2015 1052      Component Value Date/Time   CALCIUM 8.7* 07/04/2015 1052   ALKPHOS 42 07/04/2015 1052   AST 16 07/04/2015 1052   ALT 11* 07/04/2015 1052   BILITOT 1.0 07/04/2015 1052        PENDING LABS:   RADIOGRAPHIC STUDIES:  06/25/2015  ** MRI BRAIN WITHOUT AND WITH CONTRAST **  INDICATION: C71.1 Malignant neoplasm of frontal lobe, Evaluate for brain tumor progression provided.  COMPARISON: Apr 30, 2015  TECHNIQUE/PROTOCOL: Standard adult brain protocol.   CONTRAST: 68mL MultiHance IV. This MRI was performed before and after IV administration of contrast material. IV contrast was administered to improve disease detection and further define anatomy.  * GFR: Greater than 60 * Complications: No immediate patient complications or events noted.  FINDINGS:  Brain parenchyma: Status post left frontal craniotomy. Along the genu of the corpus callosum there is evidence of diffusion restriction. There is a 5.5 x 5.6 cm peripherally enhancing frontal mass extending  along the genu of the corpus callosum, previously 4.1 x 3.9 cm. Interval increase in FLAIR signal abnormality. Extra-Axial Spaces, Ventricles, Sulci: Normal for age.  Paranasal & Mastoid Sinuses: Clear.  Orbits & Soft Tissues: Normal. Bones: Normal.  IMPRESSION:  Significant interval increase in size of a peripheral enhancing mass at the posterior aspect of the resection site with elements of diffusion restriction which may represent hypercellular tumor. Consistent with disease progression.  Electronically Reviewed by: Domingo Cocking, MD  PATHOLOGY:    ASSESSMENT AND PLAN:  Malignant brain tumor, left frontal glioblastoma multiforme grade 4 With recent progression of disease.  Change in therapy by Jarrett Soho to Etoposide 50 mg/m2/day and continuation of current Avastin dose/schedule.  He started on 6/27 (we had planned on him starting today with instructions) taking Etoposide 50 mg daily.  He is taking the medication in the evening.  He denies any side effects of toxicities at this time.    He will start tonight taking Etoposide 100 mg daily.  He will complete his 3 weeks of treatment on 7/18.  He will have about 9 Etoposide capsules left over at that time.  His weight is declining.  He is to continue with Ensure supplementation.  I have encouraged him to increase his ensure to 2 bottles/cans daily.  I have recommended MEGACE therapy and we discussed the risks, benefits, alternatives, and side effects of MEGACE.  He wants to think about it and see if he can maintain or gain weight with increased ensure and improved PO intake.  I have printed information regarding Megace for him to have.  I briefly discussed goals of care.  He is hopefully that he remains a candidate for PVS-RIPO at [facility].  I will discuss code status at our next follow-up appointment.  Rx for Oxy IR and Xanax.  Education regarding Etoposide and correct dosing.    Return in 2 weeks for Avastin and  follow-up.    THERAPY PLAN:  Avastin every 2 weeks and Etoposide 100 mg days 1-21 every 28 days with follow-up at [facility].  All questions were answered. The patient knows to call the clinic with any problems, questions or concerns. We can certainly see the patient much sooner if necessary.  Patient and plan discussed with Dr. Ancil Linsey and she is in agreement with the aforementioned.   This note is electronically signed by: Robynn Pane, PA-C 07/04/2015 12:12 PM

## 2015-07-11 ENCOUNTER — Encounter (HOSPITAL_COMMUNITY): Payer: Medicaid Other | Attending: Hematology & Oncology

## 2015-07-11 DIAGNOSIS — C719 Malignant neoplasm of brain, unspecified: Secondary | ICD-10-CM

## 2015-07-11 DIAGNOSIS — C711 Malignant neoplasm of frontal lobe: Secondary | ICD-10-CM | POA: Diagnosis not present

## 2015-07-11 DIAGNOSIS — Z79899 Other long term (current) drug therapy: Secondary | ICD-10-CM | POA: Diagnosis not present

## 2015-07-11 LAB — COMPREHENSIVE METABOLIC PANEL
ALK PHOS: 39 U/L (ref 38–126)
ALT: 11 U/L — ABNORMAL LOW (ref 17–63)
ANION GAP: 8 (ref 5–15)
AST: 15 U/L (ref 15–41)
Albumin: 3.9 g/dL (ref 3.5–5.0)
BILIRUBIN TOTAL: 1 mg/dL (ref 0.3–1.2)
BUN: 19 mg/dL (ref 6–20)
CHLORIDE: 105 mmol/L (ref 101–111)
CO2: 26 mmol/L (ref 22–32)
CREATININE: 1.04 mg/dL (ref 0.61–1.24)
Calcium: 8.8 mg/dL — ABNORMAL LOW (ref 8.9–10.3)
GFR calc Af Amer: >60 mL/min (ref >60)
GLUCOSE: 101 mg/dL — AB (ref 65–99)
Potassium: 4.1 mmol/L (ref 3.5–5.1)
Sodium: 139 mmol/L (ref 135–145)
TOTAL PROTEIN: 7.4 g/dL (ref 6.5–8.1)

## 2015-07-11 LAB — CBC WITH DIFFERENTIAL/PLATELET
Basophils Absolute: 0 10*3/uL (ref 0.0–0.1)
Basophils Relative: 0 % (ref 0–1)
Eosinophils Absolute: 0.1 10*3/uL (ref 0.0–0.7)
Eosinophils Relative: 2 % (ref 0–5)
HEMATOCRIT: 44.7 % (ref 39.0–52.0)
HEMOGLOBIN: 15 g/dL (ref 13.0–17.0)
Lymphocytes Relative: 23 % (ref 12–46)
Lymphs Abs: 1.7 10*3/uL (ref 0.7–4.0)
MCH: 29.9 pg (ref 26.0–34.0)
MCHC: 33.6 g/dL (ref 30.0–36.0)
MCV: 89.2 fL (ref 78.0–100.0)
Monocytes Absolute: 0.3 10*3/uL (ref 0.1–1.0)
Monocytes Relative: 4 % (ref 3–12)
NEUTROS ABS: 5.3 10*3/uL (ref 1.7–7.7)
Neutrophils Relative %: 71 % (ref 43–77)
Platelets: 238 10*3/uL (ref 150–400)
RBC: 5.01 MIL/uL (ref 4.22–5.81)
RDW: 15.8 % — ABNORMAL HIGH (ref 11.5–15.5)
WBC: 7.5 10*3/uL (ref 4.0–10.5)

## 2015-07-11 LAB — SAMPLE TO BLOOD BANK

## 2015-07-11 NOTE — Progress Notes (Signed)
LABS DRAWN

## 2015-07-13 ENCOUNTER — Observation Stay (HOSPITAL_COMMUNITY)
Admission: EM | Admit: 2015-07-13 | Discharge: 2015-07-15 | Disposition: A | Discharge status: Home / Self Care | Payer: Medicaid Other | Attending: Family Medicine | Admitting: Family Medicine

## 2015-07-13 ENCOUNTER — Emergency Department (HOSPITAL_COMMUNITY): Payer: Medicaid Other

## 2015-07-13 ENCOUNTER — Encounter (HOSPITAL_COMMUNITY): Payer: Self-pay | Admitting: *Deleted

## 2015-07-13 DIAGNOSIS — Z720 Tobacco use: Secondary | ICD-10-CM | POA: Diagnosis not present

## 2015-07-13 DIAGNOSIS — R51 Headache: Secondary | ICD-10-CM | POA: Insufficient documentation

## 2015-07-13 DIAGNOSIS — Z79899 Other long term (current) drug therapy: Secondary | ICD-10-CM | POA: Insufficient documentation

## 2015-07-13 DIAGNOSIS — F1410 Cocaine abuse, uncomplicated: Secondary | ICD-10-CM | POA: Diagnosis not present

## 2015-07-13 DIAGNOSIS — G40909 Epilepsy, unspecified, not intractable, without status epilepticus: Secondary | ICD-10-CM | POA: Diagnosis not present

## 2015-07-13 DIAGNOSIS — K219 Gastro-esophageal reflux disease without esophagitis: Secondary | ICD-10-CM | POA: Insufficient documentation

## 2015-07-13 DIAGNOSIS — C719 Malignant neoplasm of brain, unspecified: Secondary | ICD-10-CM | POA: Diagnosis not present

## 2015-07-13 DIAGNOSIS — D496 Neoplasm of unspecified behavior of brain: Secondary | ICD-10-CM

## 2015-07-13 DIAGNOSIS — F419 Anxiety disorder, unspecified: Secondary | ICD-10-CM | POA: Insufficient documentation

## 2015-07-13 DIAGNOSIS — R4182 Altered mental status, unspecified: Secondary | ICD-10-CM | POA: Diagnosis not present

## 2015-07-13 DIAGNOSIS — E43 Unspecified severe protein-calorie malnutrition: Secondary | ICD-10-CM | POA: Insufficient documentation

## 2015-07-13 LAB — CBC WITH DIFFERENTIAL/PLATELET
Basophils Absolute: 0 10*3/uL (ref 0.0–0.1)
Basophils Relative: 0 % (ref 0–1)
EOS PCT: 0 % (ref 0–5)
Eosinophils Absolute: 0 10*3/uL (ref 0.0–0.7)
HCT: 43.8 % (ref 39.0–52.0)
HEMOGLOBIN: 15 g/dL (ref 13.0–17.0)
LYMPHS ABS: 0.6 10*3/uL — AB (ref 0.7–4.0)
LYMPHS PCT: 6 % — AB (ref 12–46)
MCH: 30.2 pg (ref 26.0–34.0)
MCHC: 34.2 g/dL (ref 30.0–36.0)
MCV: 88.3 fL (ref 78.0–100.0)
MONO ABS: 0.5 10*3/uL (ref 0.1–1.0)
Monocytes Relative: 4 % (ref 3–12)
NEUTROS ABS: 9.8 10*3/uL — AB (ref 1.7–7.7)
Neutrophils Relative %: 90 % — ABNORMAL HIGH (ref 43–77)
Platelets: 210 10*3/uL (ref 150–400)
RBC: 4.96 MIL/uL (ref 4.22–5.81)
RDW: 15.6 % — ABNORMAL HIGH (ref 11.5–15.5)
WBC: 10.9 10*3/uL — ABNORMAL HIGH (ref 4.0–10.5)

## 2015-07-13 LAB — COMPREHENSIVE METABOLIC PANEL
ALT: 11 U/L — ABNORMAL LOW (ref 17–63)
AST: 15 U/L (ref 15–41)
Albumin: 4.1 g/dL (ref 3.5–5.0)
Alkaline Phosphatase: 36 U/L — ABNORMAL LOW (ref 38–126)
Anion gap: 10 (ref 5–15)
BUN: 21 mg/dL — ABNORMAL HIGH (ref 6–20)
CO2: 20 mmol/L — ABNORMAL LOW (ref 22–32)
CREATININE: 0.89 mg/dL (ref 0.61–1.24)
Calcium: 8.7 mg/dL — ABNORMAL LOW (ref 8.9–10.3)
Chloride: 108 mmol/L (ref 101–111)
GFR calc Af Amer: >60 mL/min (ref >60)
GFR calc non Af Amer: >60 mL/min (ref >60)
Glucose, Bld: 146 mg/dL — ABNORMAL HIGH (ref 65–99)
POTASSIUM: 3.9 mmol/L (ref 3.5–5.1)
Sodium: 138 mmol/L (ref 135–145)
Total Bilirubin: 0.7 mg/dL (ref 0.3–1.2)
Total Protein: 7.5 g/dL (ref 6.5–8.1)

## 2015-07-13 LAB — ETHANOL: Alcohol, Ethyl (B): <5 mg/dL (ref <5)

## 2015-07-13 MED ORDER — LEVETIRACETAM 500 MG PO TABS
1000.0000 mg | ORAL_TABLET | Freq: Two times a day (BID) | ORAL | Status: DC
  Administered 2015-07-13 – 2015-07-15 (×4): 1000 mg via ORAL
  Filled 2015-07-13 (×4): qty 2

## 2015-07-13 MED ORDER — DEXAMETHASONE SODIUM PHOSPHATE 4 MG/ML IJ SOLN
4.0000 mg | Freq: Once | INTRAMUSCULAR | Status: AC
  Administered 2015-07-14: 4 mg via INTRAVENOUS
  Filled 2015-07-13: qty 1

## 2015-07-13 MED ORDER — ALBUTEROL SULFATE (2.5 MG/3ML) 0.083% IN NEBU: 3.0000 mL | INHALATION_SOLUTION | Freq: Four times a day (QID) | RESPIRATORY_TRACT | Status: DC | PRN

## 2015-07-13 MED ORDER — ALPRAZOLAM 1 MG PO TABS
1.0000 mg | ORAL_TABLET | Freq: Three times a day (TID) | ORAL | Status: DC | PRN
  Administered 2015-07-13: 1 mg via ORAL
  Filled 2015-07-13: qty 1

## 2015-07-13 MED ORDER — PROCHLORPERAZINE MALEATE 5 MG PO TABS
10.0000 mg | ORAL_TABLET | Freq: Four times a day (QID) | ORAL | Status: DC | PRN
  Filled 2015-07-13: qty 2

## 2015-07-13 MED ORDER — ONDANSETRON 4 MG PO TBDP
8.0000 mg | ORAL_TABLET | Freq: Three times a day (TID) | ORAL | Status: DC | PRN
  Administered 2015-07-13: 8 mg via ORAL
  Filled 2015-07-13: qty 2

## 2015-07-13 MED ORDER — OXYCODONE HCL 5 MG PO TABS
5.0000 mg | ORAL_TABLET | ORAL | Status: DC | PRN
  Administered 2015-07-13: 10 mg via ORAL
  Filled 2015-07-13: qty 2

## 2015-07-13 MED ORDER — SODIUM CHLORIDE 0.9 % IV SOLN
INTRAVENOUS | Status: AC
  Administered 2015-07-13: 23:00:00 via INTRAVENOUS

## 2015-07-13 MED ORDER — LOPERAMIDE HCL 2 MG PO CAPS: 2.0000 mg | ORAL_CAPSULE | ORAL | Status: DC | PRN

## 2015-07-13 MED ORDER — ETOPOSIDE 50 MG PO CAPS
100.0000 mg | ORAL_CAPSULE | Freq: Two times a day (BID) | ORAL | Status: DC
  Administered 2015-07-13: 100 mg via ORAL
  Filled 2015-07-13 (×5): qty 2

## 2015-07-13 NOTE — H&P (Signed)
[facility] is an 59 y.o. male.    Angus McGuiness (pcp)  Chief Complaint: altered ms HPI: 59 yo male with hx of GBM s/p resection, crack cocaine use, apparently presented with altered mental status over the past 3 days according to his family.  Pt was having difficulty with speaking, and confusion.  Pt admits to using cocaine as well.  Pt denies any focal neurological weakness. Pt had CT brain in ED which showed possible recurrence of tumor.  Pt will be admitted for evaluation of AMS secondary to brain tumor.   Past Medical History  Diagnosis Date  . Glioblastoma   . GERD (gastroesophageal reflux disease)   . Seizures   . Anxiety 09/07/2014    Past Surgical History  Procedure Laterality Date  . Salivary gland surgery    . Brain tumor excision      Family History  Problem Relation Age of Onset  . Cancer Mother   . Diabetes Mother   . Hypertension Mother   . Stroke Mother   . Diabetes Father    Social History:  reports that he has been smoking Cigarettes.  He has never used smokeless tobacco. He reports that he uses illicit drugs (Cocaine). He reports that he does not drink alcohol.  Allergies: No Known Allergies  Medications Prior to Admission  Medication Sig Dispense Refill  . acetaminophen (TYLENOL) 325 MG tablet Take 650 mg by mouth every 6 (six) hours as needed for mild pain.     Marland Kitchen albuterol (PROVENTIL HFA;VENTOLIN HFA) 108 (90 BASE) MCG/ACT inhaler Inhale 1-2 puffs into the lungs every 6 (six) hours as needed for wheezing or shortness of breath.     . ALPRAZolam (XANAX) 1 MG tablet Take 1 tablet (1 mg total) by mouth 3 (three) times daily as needed for anxiety. 60 tablet 1  . Bevacizumab (AVASTIN) 100 MG/4ML SOLN Inject into the vein every 14 (fourteen) days.     . bisacodyl (DULCOLAX) 5 MG EC tablet Take 5 mg by mouth daily as needed for moderate constipation.    Marland Kitchen etoposide (VEPESID) 50 MG capsule Take 100 mg (50 mg/m2/day) daily days 1-21 every 28 days. (Patient  taking differently: Take 50 mg/m2/day by mouth 2 (two) times daily. Take 100 mg (50 mg/m2/day) daily days 1-21 every 28 days.) 42 capsule 0  . ibuprofen (ADVIL,MOTRIN) 200 MG tablet Take 400 mg by mouth every 6 (six) hours as needed for mild pain. Pain    . levETIRAcetam (KEPPRA) 1000 MG tablet Take 1 tablet (1,000 mg total) by mouth 2 (two) times daily. 60 tablet 1  . lidocaine-prilocaine (EMLA) cream Apply a quarter size amount to port site 1 hour prior to chemo. Do not rub in. Cover with plastic wrap. 30 g 3  . loperamide (IMODIUM) 2 MG capsule Take 2 mg by mouth as needed for diarrhea or loose stools. Take 2 tablets after first loose stool and then 1 tablet every 2 hours until you have gone 12 hours without a loose stool. If you develop loose stools during the night, take 2 tablets every 4 hours. Contact the [facility] for uncontrolled loose stools.    . Multiple Vitamins-Minerals (MULTIVITAMIN WITH MINERALS) tablet Take 1 tablet by mouth daily.    . ondansetron (ZOFRAN-ODT) 8 MG disintegrating tablet Take 8 mg by mouth every 8 (eight) hours as needed for nausea or vomiting.    Marland Kitchen oxycodone (OXY-IR) 5 MG capsule Take 1-2 capsules (5-10 mg total) by mouth every 4 (four)  hours as needed for pain. 100 capsule 0  . prochlorperazine (COMPAZINE) 10 MG tablet Take 1 tablet (10 mg total) by mouth every 6 (six) hours as needed for nausea or vomiting. 30 tablet 1    Results for orders placed or performed during the hospital encounter of 07/13/15 (from the past 48 hour(s))  Comprehensive metabolic panel     Status: Abnormal   Collection Time: 07/13/15  5:30 PM  Result Value Ref Range   Sodium 138 135 - 145 mmol/L   Potassium 3.9 3.5 - 5.1 mmol/L   Chloride 108 101 - 111 mmol/L   CO2 20 (L) 22 - 32 mmol/L   Glucose, Bld 146 (H) 65 - 99 mg/dL   BUN 21 (H) 6 - 20 mg/dL   Creatinine, Ser 0.89 0.61 - 1.24 mg/dL   Calcium 8.7 (L) 8.9 - 10.3 mg/dL   Total Protein 7.5 6.5 - 8.1 g/dL   Albumin 4.1 3.5 -  5.0 g/dL   AST 15 15 - 41 U/L   ALT 11 (L) 17 - 63 U/L   Alkaline Phosphatase 36 (L) 38 - 126 U/L   Total Bilirubin 0.7 0.3 - 1.2 mg/dL   GFR calc non Af Amer >60 >60 mL/min   GFR calc Af Amer >60 >60 mL/min    Comment: (NOTE) The eGFR has been calculated using the CKD EPI equation. This calculation has not been validated in all clinical situations. eGFR's persistently <60 mL/min signify possible Chronic Kidney Disease.    Anion gap 10 5 - 15  CBC with Differential     Status: Abnormal   Collection Time: 07/13/15  5:30 PM  Result Value Ref Range   WBC 10.9 (H) 4.0 - 10.5 K/uL   RBC 4.96 4.22 - 5.81 MIL/uL   Hemoglobin 15.0 13.0 - 17.0 g/dL   HCT 43.8 39.0 - 52.0 %   MCV 88.3 78.0 - 100.0 fL   MCH 30.2 26.0 - 34.0 pg   MCHC 34.2 30.0 - 36.0 g/dL   RDW 15.6 (H) 11.5 - 15.5 %   Platelets 210 150 - 400 K/uL   Neutrophils Relative % 90 (H) 43 - 77 %   Neutro Abs 9.8 (H) 1.7 - 7.7 K/uL   Lymphocytes Relative 6 (L) 12 - 46 %   Lymphs Abs 0.6 (L) 0.7 - 4.0 K/uL   Monocytes Relative 4 3 - 12 %   Monocytes Absolute 0.5 0.1 - 1.0 K/uL   Eosinophils Relative 0 0 - 5 %   Eosinophils Absolute 0.0 0.0 - 0.7 K/uL   Basophils Relative 0 0 - 1 %   Basophils Absolute 0.0 0.0 - 0.1 K/uL  Ethanol     Status: None   Collection Time: 07/13/15  5:32 PM  Result Value Ref Range   Alcohol, Ethyl (B) <5 <5 mg/dL    Comment:        LOWEST DETECTABLE LIMIT FOR SERUM ALCOHOL IS 5 mg/dL FOR MEDICAL PURPOSES ONLY    Dg Chest 2 View  07/13/2015   CLINICAL DATA:  Detox from crack, Vomiting began at 1300 today. Being treated at [facility] for Stage 4 brain tumor. History of seizures.  EXAM: CHEST  2 VIEW  COMPARISON:  10/21/2013  FINDINGS: Right-sided power port tip to level of the lower superior vena cava. Heart size is normal. There is moderate emphysema throughout the lungs, stable in appearance. There are no focal consolidations or pleural effusions. No pulmonary edema.  IMPRESSION: 1. Emphysema. 2.  No  evidence for acute  abnormality.   Electronically Signed   By: Nolon Nations M.D.   On: 07/13/2015 17:56   Ct Head Wo Contrast  07/13/2015   CLINICAL DATA:  Headache and nausea today. History of glioblastoma. Status post resection 2015. Complains of memory loss.  EXAM: CT HEAD WITHOUT CONTRAST  TECHNIQUE: Contiguous axial images were obtained from the base of the skull through the vertex without intravenous contrast.  COMPARISON:  12/29/2014  FINDINGS: Again noted is encephalomalacia involving the left frontal lobe consistent for section of previous glioblastoma. However there has been development of new edema in the region of the genu of the corpus callosum and right frontal lobe, splaying the frontal horns of the lateral ventricles. There is increased edema in the region of the left temporal lobe and basal ganglia. Findings are suspicious for recurrent tumor and/or edema. Further evaluation with MRI is recommended. Hyperdense foci are identified in the left frontal region, possibly postoperative. Small is foci of hemorrhage not entirely excluded.  Bone windows show previous craniectomy changes. There has been some improvement in the appearance of chronic sinusitis though mucoperiosteal thickening persists.  IMPRESSION: 1. New low attenuation in the region of the genu of the corpus callosum and right frontal lobe, suspicious for tumor recurrence. Similar appearance could be seen with tumor edema or posttreatment changes. 2. Further evaluation with MRI with contrast recommended. 3. Critical Value/emergent results were called by telephone at the time of interpretation on 07/13/2015 at 6:34 pm to Dr. Davonna Belling , who verbally acknowledged these results.   Electronically Signed   By: Nolon Nations M.D.   On: 07/13/2015 18:36    Review of Systems  Constitutional: Negative for fever, chills, weight loss, malaise/fatigue and diaphoresis.  HENT: Negative.   Eyes: Negative.   Respiratory: Negative.    Cardiovascular: Negative.   Gastrointestinal: Negative.   Genitourinary: Negative.   Musculoskeletal: Negative.   Skin: Negative.   Neurological: Positive for speech change and weakness. Negative for dizziness, tingling, tremors, sensory change, focal weakness, seizures and loss of consciousness.  Endo/Heme/Allergies: Negative.   Psychiatric/Behavioral: Negative.     Blood pressure 140/81, pulse 65, temperature 97.6 F (36.4 C), temperature source Oral, resp. rate 20, height 5\' 9"  (1.753 m), weight 67.268 kg (148 lb 4.8 oz), SpO2 100 %. Physical Exam  Constitutional: He is oriented to person, place, and time. He appears well-developed and well-nourished.  HENT:  Head: Normocephalic and atraumatic.  Eyes: Conjunctivae and EOM are normal. Pupils are equal, round, and reactive to light. No scleral icterus.  Neck: Normal range of motion. Neck supple. No JVD present. No tracheal deviation present. No thyromegaly present.  Cardiovascular: Normal rate and regular rhythm.  Exam reveals no gallop and no friction rub.   No murmur heard. Musculoskeletal: Normal range of motion. He exhibits no edema or tenderness.  Lymphadenopathy:    He has no cervical adenopathy.  Neurological: He is alert and oriented to person, place, and time. He has normal reflexes. He displays normal reflexes. No cranial nerve deficit. He exhibits normal muscle tone. Coordination normal.  Skin: Skin is warm and dry. No rash noted. No erythema. No pallor.  Psychiatric: He has a normal mood and affect. His behavior is normal. Judgment and thought content normal.     Assessment/Plan AMS secondary to ? Brain tumor recurrence vs cocaine use MRI Brain with and without contrast Check ua  GBM ? Recurrence Dexamethasone 4mg  iv x1, for ? Edema Oncology consult placed in computer  Seizure do Cont keppra  Anxiety:  Cont xanax  Cocaine use Pt was counselled 5 minutes on cessation  DVT prophylaxis:  Cy Blamer 07/13/2015, 11:14 PM

## 2015-07-13 NOTE — Progress Notes (Addendum)
Called ED for report. Received report from [employer].

## 2015-07-13 NOTE — ED Notes (Signed)
Pt states he is addicted to crack.Last used crack 3 days ago. Vomiting began at 1300 today. Family states pt has been more forgetful and slow recently

## 2015-07-13 NOTE — ED Notes (Signed)
Attempted to get urine specimen. Pt unable

## 2015-07-13 NOTE — ED Provider Notes (Signed)
CSN: [id number]     Arrival date & time 07/13/15  1517 History   First MD Initiated Contact with Patient 07/13/15 1613     Chief Complaint  Patient presents with  . Drug Problem     (Consider location/radiation/quality/duration/timing/severity/associated sxs/prior Treatment) Patient is a 59 y.o. male presenting with drug problem. The history is provided by the patient.  Drug Problem Associated symptoms include headaches. Pertinent negatives include no chest pain and no abdominal pain.   patient has stage IV glioblastoma. Presents with increasing confusion over the last few months and substance abuse. States he's been abusing crack. Has about a half milligram total gram a day. Last use 3 days ago. Over the last 3 days he has been more confused. Is having memory issues and not remembering what he did. This reportedly has been going on before this decrease in his crack use but intensified over the last few days. Has had a mild headache. No fevers. No chills. No cough. He has some anxiety/depression. States it has been getting worse also. No suicidal thoughts.  Past Medical History  Diagnosis Date  . Glioblastoma   . GERD (gastroesophageal reflux disease)   . Seizures   . Anxiety 09/07/2014   Past Surgical History  Procedure Laterality Date  . Salivary gland surgery    . Brain tumor excision     Family History  Problem Relation Age of Onset  . Cancer Mother   . Diabetes Mother   . Hypertension Mother   . Stroke Mother   . Diabetes Father    History  Substance Use Topics  . Smoking status: Current Every Day Smoker    Types: Cigarettes  . Smokeless tobacco: Never Used  . Alcohol Use: No     Comment: quit March 2015    Review of Systems  Constitutional: Positive for appetite change and unexpected weight change.  HENT: Negative for dental problem.   Respiratory: Negative for chest tightness.   Cardiovascular: Negative for chest pain.  Gastrointestinal: Negative for abdominal  pain.  Genitourinary: Negative for dysuria.  Musculoskeletal: Negative for back pain.  Skin: Negative for wound.  Neurological: Positive for headaches.  Hematological: Negative for adenopathy.  Psychiatric/Behavioral: Positive for dysphoric mood. The patient is nervous/anxious.       Allergies  Review of patient's allergies indicates no known allergies.  Home Medications   Prior to Admission medications   Medication Sig Start Date End Date Taking? Authorizing Provider  acetaminophen (TYLENOL) 325 MG tablet Take 650 mg by mouth every 6 (six) hours as needed for mild pain.  03/06/14  Yes Historical Provider, MD  albuterol (PROVENTIL HFA;VENTOLIN HFA) 108 (90 BASE) MCG/ACT inhaler Inhale 1-2 puffs into the lungs every 6 (six) hours as needed for wheezing or shortness of breath.  02/22/14  Yes Historical Provider, MD  ALPRAZolam Duanne Moron) 1 MG tablet Take 1 tablet (1 mg total) by mouth 3 (three) times daily as needed for anxiety. 07/04/15  Yes Manon Hilding Kefalas, PA-C  Bevacizumab (AVASTIN) 100 MG/4ML SOLN Inject into the vein every 14 (fourteen) days.    Yes Historical Provider, MD  bisacodyl (DULCOLAX) 5 MG EC tablet Take 5 mg by mouth daily as needed for moderate constipation.   Yes Historical Provider, MD  etoposide (VEPESID) 50 MG capsule Take 100 mg (50 mg/m2/day) daily days 1-21 every 28 days. Patient taking differently: Take 50 mg/m2/day by mouth 2 (two) times daily. Take 100 mg (50 mg/m2/day) daily days 1-21 every 28 days. 06/25/15  Yes  Manon Hilding Kefalas, PA-C  ibuprofen (ADVIL,MOTRIN) 200 MG tablet Take 400 mg by mouth every 6 (six) hours as needed for mild pain. Pain   Yes Historical Provider, MD  levETIRAcetam (KEPPRA) 1000 MG tablet Take 1 tablet (1,000 mg total) by mouth 2 (two) times daily. 05/02/15  Yes Patrici Ranks, MD  lidocaine-prilocaine (EMLA) cream Apply a quarter size amount to port site 1 hour prior to chemo. Do not rub in. Cover with plastic wrap. 10/10/14  Yes Farrel Gobble, MD  loperamide (IMODIUM) 2 MG capsule Take 2 mg by mouth as needed for diarrhea or loose stools. Take 2 tablets after first loose stool and then 1 tablet every 2 hours until you have gone 12 hours without a loose stool. If you develop loose stools during the night, take 2 tablets every 4 hours. Contact the [facility] for uncontrolled loose stools. 10/10/14  Yes Farrel Gobble, MD  Multiple Vitamins-Minerals (MULTIVITAMIN WITH MINERALS) tablet Take 1 tablet by mouth daily.   Yes Historical Provider, MD  ondansetron (ZOFRAN-ODT) 8 MG disintegrating tablet Take 8 mg by mouth every 8 (eight) hours as needed for nausea or vomiting. 04/21/14  Yes Farrel Gobble, MD  oxycodone (OXY-IR) 5 MG capsule Take 1-2 capsules (5-10 mg total) by mouth every 4 (four) hours as needed for pain. 07/04/15  Yes Baird Cancer, PA-C  prochlorperazine (COMPAZINE) 10 MG tablet Take 1 tablet (10 mg total) by mouth every 6 (six) hours as needed for nausea or vomiting. 03/08/15  Yes Manon Hilding Kefalas, PA-C   BP 107/74 mmHg  Pulse 59  Temp(Src) 97.8 F (36.6 C) (Oral)  Resp 20  Ht 5\' 9"  (1.753 m)  Wt 149 lb 4.8 oz (67.722 kg)  BMI 22.04 kg/m2  SpO2 98% Physical Exam  Constitutional: He appears well-developed.  HENT:  Head: Atraumatic.  Neck: Neck supple.  Cardiovascular: Normal rate.   Pulmonary/Chest: Effort normal.  Abdominal: Soft.  Musculoskeletal: Normal range of motion.  Neurological: He is alert.  Skin: Skin is warm.  Psychiatric: He has a normal mood and affect.    ED Course  Procedures (including critical care time) Labs Review Labs Reviewed  COMPREHENSIVE METABOLIC PANEL - Abnormal; Notable for the following:    CO2 20 (*)    Glucose, Bld 146 (*)    BUN 21 (*)    Calcium 8.7 (*)    ALT 11 (*)    Alkaline Phosphatase 36 (*)    All other components within normal limits  CBC WITH DIFFERENTIAL/PLATELET - Abnormal; Notable for the following:    WBC 10.9 (*)    RDW 15.6 (*)     Neutrophils Relative % 90 (*)    Neutro Abs 9.8 (*)    Lymphocytes Relative 6 (*)    Lymphs Abs 0.6 (*)    All other components within normal limits  COMPREHENSIVE METABOLIC PANEL - Abnormal; Notable for the following:    Glucose, Bld 129 (*)    Calcium 8.8 (*)    ALT 10 (*)    Alkaline Phosphatase 36 (*)    All other components within normal limits  CBC - Abnormal; Notable for the following:    RDW 15.7 (*)    All other components within normal limits  ETHANOL    Imaging Review Mr Kizzie Fantasia Contrast  07/14/2015   CLINICAL DATA:  59 year old male with glioblastoma resection in and April and March of 2015. Treatment reportedly at [facility]. Reportedly with recent Disease progression on [facility]  brain MRI in June. Altered mental status, confusion, seizure activity  EXAM: MRI HEAD WITHOUT AND WITH CONTRAST  TECHNIQUE: Multiplanar, multiecho pulse sequences of the brain and surrounding structures were obtained without and with intravenous contrast.  CONTRAST:  3mL MULTIHANCE GADOBENATE DIMEGLUMINE 529 MG/ML IV SOLN  COMPARISON:  Summary of [facility] brain MRI findings from Oncology progress note, 06/25/2015, and earlier. [facility] brain MRI 04/19/2014. Head CT without contrast 07/13/2015.  FINDINGS: Severe progression of disease compared to 04/19/2014. Bulky "butterfly glioma" type appearance with mass like heterogeneously increased T2 and FLAIR hyperintensity extending in both anterior frontal lobes across the corpus callosum. Relatively mild associated nodular enhancement of the lesion superimposed on a small volume of intrinsic T1 hyperintensity about the left frontal horn. Confluent restricted diffusion throughout much of the dominant tumor site in keeping with hypercellularity. Regional mass effect and confluent additional bifrontal surrounding T2 and FLAIR hyperintensity in a vasogenic edema type pattern, but could reflect nonenhancing tumor in this setting.  Partial effacement of both frontal  horns, but no ventriculomegaly. Small volume hemosiderin in both frontal lobes, more so the left. Overlying bifrontal craniotomy changes.  No acute intracranial hemorrhage identified. No superimposed restricted diffusion typical of acute infarct. Outside of the tumor area, no abnormal intracranial enhancement. Major intracranial vascular flow voids are stable. Bone marrow signal is normal.  Stable paranasal sinus inflammation. Orbits soft tissues appear normal. Stable mild left petrous apex fluid. Mastoids are otherwise clear. Small volume retained secretions in the nasopharynx. No acute scalp soft tissue findings. No significant extra-axial collection. Basilar cisterns remain patent. Negative pituitary, cervicomedullary junction and visualized cervical spine.  IMPRESSION: 1. Severe progression of glioblastoma compared to 04/19/2014. Bulky "Butterfly Glioma" type appearance now with extensive bifrontal nonenhancing and enhancing tumor and edema. 2. No ventriculomegaly or impending herniation. 3. No new intracranial abnormality identified.   Electronically Signed   By: Genevie Ann M.D.   On: 07/14/2015 11:20     EKG Interpretation None      MDM   Final diagnoses:  Malignant brain tumor, left frontal glioblastoma multiforme grade 4  Cocaine abuse    Patient with altered mental status and crack abuse. Head ct shows changes that could be worsening tumor. admit    Davonna Belling, MD 07/16/15 [phone]

## 2015-07-13 NOTE — ED Notes (Signed)
Per pt last used crack 3 days ago. States he uses almost everyday. Pt reports he uses 1/2 to 1 gram a day. Pt also in treatment at [facility] for a "stage 4 brain tumor" Pt states he goes every 8 weeks for treatment last treatment was about 2.5 weeks ago. Pt reports withdrawal sx as memory loss and loss of motivation. Pt also reports depression and anxiety.

## 2015-07-14 ENCOUNTER — Observation Stay (HOSPITAL_COMMUNITY): Payer: Medicaid Other

## 2015-07-14 DIAGNOSIS — E43 Unspecified severe protein-calorie malnutrition: Secondary | ICD-10-CM | POA: Insufficient documentation

## 2015-07-14 LAB — COMPREHENSIVE METABOLIC PANEL
ALK PHOS: 36 U/L — AB (ref 38–126)
ALT: 10 U/L — AB (ref 17–63)
AST: 15 U/L (ref 15–41)
Albumin: 3.7 g/dL (ref 3.5–5.0)
Anion gap: 6 (ref 5–15)
BUN: 17 mg/dL (ref 6–20)
CO2: 24 mmol/L (ref 22–32)
Calcium: 8.8 mg/dL — ABNORMAL LOW (ref 8.9–10.3)
Chloride: 109 mmol/L (ref 101–111)
Creatinine, Ser: 0.67 mg/dL (ref 0.61–1.24)
GLUCOSE: 129 mg/dL — AB (ref 65–99)
POTASSIUM: 4.1 mmol/L (ref 3.5–5.1)
SODIUM: 139 mmol/L (ref 135–145)
Total Bilirubin: 0.8 mg/dL (ref 0.3–1.2)
Total Protein: 7 g/dL (ref 6.5–8.1)

## 2015-07-14 LAB — CBC
HCT: 41.6 % (ref 39.0–52.0)
Hemoglobin: 14 g/dL (ref 13.0–17.0)
MCH: 29.9 pg (ref 26.0–34.0)
MCHC: 33.7 g/dL (ref 30.0–36.0)
MCV: 88.7 fL (ref 78.0–100.0)
Platelets: 231 10*3/uL (ref 150–400)
RBC: 4.69 MIL/uL (ref 4.22–5.81)
RDW: 15.7 % — AB (ref 11.5–15.5)
WBC: 8.7 10*3/uL (ref 4.0–10.5)

## 2015-07-14 MED ORDER — NICOTINE 21 MG/24HR TD PT24
21.0000 mg | MEDICATED_PATCH | Freq: Every day | TRANSDERMAL | Status: DC
  Administered 2015-07-14 – 2015-07-15 (×2): 21 mg via TRANSDERMAL
  Filled 2015-07-14 (×2): qty 1

## 2015-07-14 MED ORDER — GADOBENATE DIMEGLUMINE 529 MG/ML IV SOLN
13.0000 mL | Freq: Once | INTRAVENOUS | Status: AC | PRN
  Administered 2015-07-14: 13 mL via INTRAVENOUS

## 2015-07-14 MED ORDER — ETOPOSIDE 50 MG PO CAPS
100.0000 mg | ORAL_CAPSULE | Freq: Two times a day (BID) | ORAL | Status: DC
  Administered 2015-07-14 – 2015-07-15 (×3): 100 mg via ORAL

## 2015-07-14 MED ORDER — ENSURE ENLIVE PO LIQD
237.0000 mL | Freq: Three times a day (TID) | ORAL | Status: DC
  Administered 2015-07-14 – 2015-07-15 (×4): 237 mL via ORAL

## 2015-07-14 NOTE — Progress Notes (Signed)
Subjective: The patient is alert and cooperative. He was admitted with altered mental status for several days. He has history of brain tumor in his been treated for this at [facility]. A CT of the brain showed possible recurrence of tumor. Also has history of substance abuse  Objective: Vital signs in last 24 hours: Temp:  [97.4 F (36.3 C)-97.7 F (36.5 C)] 97.4 F (36.3 C) (07/16 0624) Pulse Rate:  [61-92] 61 (07/16 0624) Resp:  [18-20] 20 (07/16 0624) BP: (100-140)/(71-89) 100/71 mmHg (07/16 0624) SpO2:  [92 %-100 %] 96 % (07/16 0624) Weight:  [65.363 kg (144 lb 1.6 oz)-67.268 kg (148 lb 4.8 oz)] 65.363 kg (144 lb 1.6 oz) (07/16 9518) Weight change:  Last BM Date: 07/13/15  Intake/Output from previous day:   Intake/Output this shift:    Physical Exam: Gen. appearance the patient is alert and oriented  HEENT negative  Neck supple no JVD or thyroid abnormalities  Lungs clear to P&A  Heart regular rhythm no murmurs  Abdomen no palpable organs or masses  Neurological no cranial nerve abnormalities-normal muscle tone and reflexes normal   Recent Labs  07/13/15 1730 07/14/15 0634  WBC 10.9* 8.7  HGB 15.0 14.0  HCT 43.8 41.6  PLT 210 231   BMET  Recent Labs  07/13/15 1730 07/14/15 0634  NA 138 139  K 3.9 4.1  CL 108 109  CO2 20* 24  GLUCOSE 146* 129*  BUN 21* 17  CREATININE 0.89 0.67  CALCIUM 8.7* 8.8*    Studies/Results: Dg Chest 2 View  07/13/2015   CLINICAL DATA:  Detox from crack, Vomiting began at 1300 today. Being treated at [facility] for Stage 4 brain tumor. History of seizures.  EXAM: CHEST  2 VIEW  COMPARISON:  10/21/2013  FINDINGS: Right-sided power port tip to level of the lower superior vena cava. Heart size is normal. There is moderate emphysema throughout the lungs, stable in appearance. There are no focal consolidations or pleural effusions. No pulmonary edema.  IMPRESSION: 1. Emphysema. 2.  No evidence for acute  abnormality.    Electronically Signed   By: Nolon Nations M.D.   On: 07/13/2015 17:56   Ct Head Wo Contrast  07/13/2015   CLINICAL DATA:  Headache and nausea today. History of glioblastoma. Status post resection 2015. Complains of memory loss.  EXAM: CT HEAD WITHOUT CONTRAST  TECHNIQUE: Contiguous axial images were obtained from the base of the skull through the vertex without intravenous contrast.  COMPARISON:  12/29/2014  FINDINGS: Again noted is encephalomalacia involving the left frontal lobe consistent for section of previous glioblastoma. However there has been development of new edema in the region of the genu of the corpus callosum and right frontal lobe, splaying the frontal horns of the lateral ventricles. There is increased edema in the region of the left temporal lobe and basal ganglia. Findings are suspicious for recurrent tumor and/or edema. Further evaluation with MRI is recommended. Hyperdense foci are identified in the left frontal region, possibly postoperative. Small is foci of hemorrhage not entirely excluded.  Bone windows show previous craniectomy changes. There has been some improvement in the appearance of chronic sinusitis though mucoperiosteal thickening persists.  IMPRESSION: 1. New low attenuation in the region of the genu of the corpus callosum and right frontal lobe, suspicious for tumor recurrence. Similar appearance could be seen with tumor edema or posttreatment changes. 2. Further evaluation with MRI with contrast recommended. 3. Critical Value/emergent results were called by telephone at the time  of interpretation on 07/13/2015 at 6:34 pm to Dr. Davonna Belling , who verbally acknowledged these results.   Electronically Signed   By: Nolon Nations M.D.   On: 07/13/2015 18:36    Medications:  . etoposide  50 mg/m2 Oral BID  . feeding supplement (ENSURE ENLIVE)  237 mL Oral TID BM  . levETIRAcetam  1,000 mg Oral BID        Assessment/Plan: 1. Altered mental status possible  recurrent tumor of brain frontal lobe  2. Substance abuse-plan to continue current regimen will complete MRI of brain. The patient should be able to be discharged within the next 24 hours. He does have follow-up appointment at 2 regarding the brain tumor     Sya Nestler G 07/14/2015, 9:47 AM

## 2015-07-14 NOTE — Progress Notes (Signed)
Initial Nutrition Assessment  DOCUMENTATION CODES:  Severe malnutrition in context of chronic illness  INTERVENTION:  Ensure Enlive po TID, each supplement provides 350 kcal and 20 grams of protein  NUTRITION DIAGNOSIS:  Inadequate oral intake related to lethargy/confusion, cancer and cancer related treatments, chronic illness as evidenced by loss of 8% bw in 54month  GOAL:  Patient will meet greater than or equal to 90% of their needs  MONITOR:  PO intake, Supplement acceptance, Labs, I & O's, Weight trends  REASON FOR ASSESSMENT:  Malnutrition Screening Tool    ASSESSMENT:  59 yo male with hx of GBM s/p resection who is being followed by RD at [facility]. Also hx of crack cocaine use, presents with AMS over the past 3 days. Pt  having difficulty with speaking, and confusion. Pt admits to using cocaine as well.  RD following, last spoke with about 2 months ago. At that time he was eating 2 meals with a small snack. Had ordered Ensure case for him. At that time he wasn't eating well too well. Considering his acceleration of weight loss and substance abuse I doubt his intake has improved since last assessment.   Will continue to give while he is admitted.   Diet Order:   Regular  Skin:  Reviewed, no issues  Last BM: 7/15  Height:  Ht Readings from Last 1 Encounters:  07/13/15 5\' 9"  (1.753 m)    Weight:  Wt Readings from Last 1 Encounters:  07/14/15 144 lb 1.6 oz (65.363 kg)   Ideal Body Weight:  72.7 kg  Wt Readings from Last 10 Encounters:  07/14/15 144 lb 1.6 oz (65.363 kg)  07/04/15 153 lb 6.4 oz (69.582 kg)  06/20/15 157 lb 12.8 oz (71.578 kg)  06/06/15 160 lb 6.4 oz (72.757 kg)  05/23/15 165 lb 4.8 oz (74.98 kg)  05/16/15 164 lb (74.39 kg)  05/09/15 170 lb 3.2 oz (77.202 kg)  04/18/15 174 lb 9.6 oz (79.198 kg)  04/04/15 177 lb 4.8 oz (80.423 kg)  03/21/15 176 lb 3.2 oz (79.924 kg)  Pt has lost 13 lbs in 1 month. (8%)  BMI:  Body mass index is 21.27  kg/(m^2).  Estimated Nutritional Needs:  Kcal:  1950-2275 kcals (30-35 kcal/kg) Protein:  85-98 (1.3-1.5 g/kg) Fluid:  2 liters  EDUCATION NEEDS:  Education needs no appropriate at this time  Burtis Junes RD, LDN Nutrition Pager: [phone] 07/14/2015 8:59 AM

## 2015-07-15 NOTE — Progress Notes (Signed)
DISCHARGE INSTRUCTIONS GIVEN. IV D/C'D. PT'S FAMILY CALLED PER PT'S REQUEWST TO COME AND TAKE HIM HOME.

## 2015-07-15 NOTE — Progress Notes (Signed)
PT IS HAVING TELE/PSY CONSULT/EVAL AT THIS TIME.HIS SISTER IS ALSO PRESENT AT THIS TIME

## 2015-07-15 NOTE — BH Assessment (Addendum)
Assessment Note  Cody Buchanan is an 59 y.o. male. TTS contacted after pt was scheduled for discharge from medical floor at [facility] and family members requested that pt be committed due to cocaine use.  Pt acknowledges use of cocaine and indicates willingness to pursue treatment.  Pt denies any SI/HI/AV.  With pt permission, TTS spoke with pt's sister, Cody Buchanan, regarding the commitment criteria.  She reports that pt has been confused and unable to have a conversation.  (TTS completed entire assessment on pt and pt did not appear confused at any time)  She also reports that pt cocaine use is a big concern.  She acknowledged that pt is not reporting and danger to self or others.  Clinician talked with her about the process of seeking help through [facility].  Cody Buchanan was not happy about this but did indicate that she understood that pt could not be committed based on cocaine use.  TTS completed assessment with pt, who indicates willingness to pursue help for cocaine use through [facility].    Axis I: Substance Abuse Axis II: Deferred Axis III:  Past Medical History  Diagnosis Date  . Glioblastoma   . GERD (gastroesophageal reflux disease)   . Seizures   . Anxiety 09/07/2014   Axis IV: cancer, father died Jun 22, 2015 Axis V: 51-60 moderate symptoms  Past Medical History:  Past Medical History  Diagnosis Date  . Glioblastoma   . GERD (gastroesophageal reflux disease)   . Seizures   . Anxiety 09/07/2014    Past Surgical History  Procedure Laterality Date  . Salivary gland surgery    . Brain tumor excision      Family History:  Family History  Problem Relation Age of Onset  . Cancer Mother   . Diabetes Mother   . Hypertension Mother   . Stroke Mother   . Diabetes Father     Social History:  reports that he has been smoking Cigarettes.  He has never used smokeless tobacco. He reports that he uses illicit drugs (Cocaine). He reports that he does not drink alcohol.  Additional Social History:   Alcohol / Drug Use Pain Medications: pt does take pain medication, but denies abuse of pain medication Prescriptions: pt denies History of alcohol / drug use?: Yes Substance #1 Name of Substance 1: cocaine 1 - Amount (size/oz): 1/2-1 gram 1 - Frequency: 3-4 times per week 1 - Duration: since 1980 1 - Last Use / Amount: 7/15, 1/2-1 gram  CIWA: CIWA-Ar BP: 107/74 mmHg Pulse Rate: (!) 59 COWS:    Allergies: No Known Allergies  Home Medications:  Medications Prior to Admission  Medication Sig Dispense Refill  . acetaminophen (TYLENOL) 325 MG tablet Take 650 mg by mouth every 6 (six) hours as needed for mild pain.     Marland Kitchen albuterol (PROVENTIL HFA;VENTOLIN HFA) 108 (90 BASE) MCG/ACT inhaler Inhale 1-2 puffs into the lungs every 6 (six) hours as needed for wheezing or shortness of breath.     . ALPRAZolam (XANAX) 1 MG tablet Take 1 tablet (1 mg total) by mouth 3 (three) times daily as needed for anxiety. 60 tablet 1  . Bevacizumab (AVASTIN) 100 MG/4ML SOLN Inject into the vein every 14 (fourteen) days.     . bisacodyl (DULCOLAX) 5 MG EC tablet Take 5 mg by mouth daily as needed for moderate constipation.    Marland Kitchen etoposide (VEPESID) 50 MG capsule Take 100 mg (50 mg/m2/day) daily days 1-21 every 28 days. (Patient taking differently: Take 50 mg/m2/day  by mouth 2 (two) times daily. Take 100 mg (50 mg/m2/day) daily days 1-21 every 28 days.) 42 capsule 0  . ibuprofen (ADVIL,MOTRIN) 200 MG tablet Take 400 mg by mouth every 6 (six) hours as needed for mild pain. Pain    . levETIRAcetam (KEPPRA) 1000 MG tablet Take 1 tablet (1,000 mg total) by mouth 2 (two) times daily. 60 tablet 1  . lidocaine-prilocaine (EMLA) cream Apply a quarter size amount to port site 1 hour prior to chemo. Do not rub in. Cover with plastic wrap. 30 g 3  . loperamide (IMODIUM) 2 MG capsule Take 2 mg by mouth as needed for diarrhea or loose stools. Take 2 tablets after first loose stool and then 1 tablet every 2 hours until you have  gone 12 hours without a loose stool. If you develop loose stools during the night, take 2 tablets every 4 hours. Contact the [place] for uncontrolled loose stools.    . Multiple Vitamins-Minerals (MULTIVITAMIN WITH MINERALS) tablet Take 1 tablet by mouth daily.    . ondansetron (ZOFRAN-ODT) 8 MG disintegrating tablet Take 8 mg by mouth every 8 (eight) hours as needed for nausea or vomiting.    Marland Kitchen oxycodone (OXY-IR) 5 MG capsule Take 1-2 capsules (5-10 mg total) by mouth every 4 (four) hours as needed for pain. 100 capsule 0  . prochlorperazine (COMPAZINE) 10 MG tablet Take 1 tablet (10 mg total) by mouth every 6 (six) hours as needed for nausea or vomiting. 30 tablet 1    OB/GYN Status:  No LMP for male patient.  General Assessment Data Location of Assessment: [facility]) TTS Assessment: In system Is this a Tele or Face-to-Face Assessment?: Tele Assessment Is this an Initial Assessment or a Re-assessment for this encounter?: Initial Assessment Marital status: Divorced Living Arrangements: Parent Can pt return to current living arrangement?: Yes     Crisis Care Plan Living Arrangements: Parent Name of Psychiatrist: none Name of Therapist: none     Risk to self with the past 6 months Suicidal Ideation: No Has patient been a risk to self within the past 6 months prior to admission? : No Suicidal Intent: No Has patient had any suicidal intent within the past 6 months prior to admission? : No Is patient at risk for suicide?: No Suicidal Plan?: No Has patient had any suicidal plan within the past 6 months prior to admission? : No Access to Means: No What has been your use of drugs/alcohol within the last 12 months?: current cocaine use Previous Attempts/Gestures: No Intentional Self Injurious Behavior: None Family Suicide History: No Recent stressful life event(s): Loss (Comment) (father died 06/03/15) Persecutory voices/beliefs?: No Depression: No Substance abuse  history and/or treatment for substance abuse?: Yes Suicide prevention information given to non-admitted patients:  (no-pt at [facility])  Risk to Others within the past 6 months Homicidal Ideation: No Does patient have any lifetime risk of violence toward others beyond the six months prior to admission? : No Thoughts of Harm to Others: No Current Homicidal Intent: No Current Homicidal Plan: No Access to Homicidal Means: No History of harm to others?: No Assessment of Violence: None Noted Does patient have access to weapons?: No Criminal Charges Pending?: No Does patient have a court date: No Is patient on probation?: No  Psychosis Hallucinations: None noted Delusions: None noted  Mental Status Report Appearance/Hygiene: Unremarkable Eye Contact: Fair (telepsych machine not working both ways-pt could not see) Motor Activity: Unremarkable Speech: Logical/coherent Level of Consciousness:  Alert Mood: Pleasant Affect: Appropriate to circumstance Anxiety Level: None Thought Processes: Coherent, Relevant Judgement: Unimpaired Orientation: Person, Place, Time, Situation Obsessive Compulsive Thoughts/Behaviors: None  Cognitive Functioning Concentration: Normal Memory: Recent Intact, Remote Intact IQ: Average Insight: Fair Impulse Control: Fair Appetite: Poor Weight Loss: 50 Weight Gain: 0 Sleep: Decreased Total Hours of Sleep:  (problems off and on-unsure of amounts) Vegetative Symptoms: None  ADLScreening [facility] Assessment Services) Patient's cognitive ability adequate to safely complete daily activities?: Yes Patient able to express need for assistance with ADLs?: Yes Independently performs ADLs?: Yes (appropriate for developmental age)  Prior Inpatient Therapy Prior Inpatient Therapy: Yes Prior Therapy Dates: 1990s Prior Therapy Facilty/Provider(s): residential drug treatment Reason for Treatment: substance use  Prior Outpatient Therapy Prior Outpatient Therapy:  No Does patient have an ACCT team?: No Does patient have Intensive In-House Services?  : No Does patient have [facility] services? : No Does patient have P4CC services?: No  ADL Screening (condition at time of admission) Patient's cognitive ability adequate to safely complete daily activities?: Yes Is the patient deaf or have difficulty hearing?: No Does the patient have difficulty seeing, even when wearing glasses/contacts?: No Does the patient have difficulty concentrating, remembering, or making decisions?: No Patient able to express need for assistance with ADLs?: Yes Does the patient have difficulty dressing or bathing?: No Independently performs ADLs?: Yes (appropriate for developmental age) Does the patient have difficulty walking or climbing stairs?: No Weakness of Legs: None Weakness of Arms/Hands: None  Home Assistive Devices/Equipment Home Assistive Devices/Equipment: None  Therapy Consults (therapy consults require a physician order) PT Evaluation Needed: No OT Evalulation Needed: No SLP Evaluation Needed: No Abuse/Neglect Assessment (Assessment to be complete while patient is alone) Physical Abuse: Denies Verbal Abuse: Denies Sexual Abuse: Denies Exploitation of patient/patient's resources: Denies Self-Neglect: Denies Values / Beliefs Cultural Requests During Hospitalization: None Spiritual Requests During Hospitalization: None Consults Spiritual Care Consult Needed: No Social Work Consult Needed: No Regulatory affairs officer (For Healthcare) Does patient have an advance directive?: No Would patient like information on creating an advanced directive?: No - patient declined information Nutrition Screen- [facility] Adult/[facility] Patient's home diet: Regular Has the patient recently lost weight without trying?: Yes, 24-33 lbs. Has the patient been eating poorly because of a decreased appetite?: Yes Malnutrition Screening Tool Score: 4  Additional Information 1:1 In Past 12  Months?: No CIRT Risk: No Elopement Risk: No Does patient have medical clearance?: Yes     Disposition: I discussed this pt with [facility] NP Elmarie Shiley, who agreed that there was no criteria to commit this pt.  I spoke with Dr.  Luan Pulling at [facility] and informed him of this as well.  Pt and pt's sister agreed that they would follow up at [facility] and [facility] will provide info for this referral as well. Disposition Initial Assessment Completed for this Encounter: Yes Disposition of Patient: Other dispositions Other disposition(s): Referred to outside facility ([facility])  On Site Evaluation by:   Reviewed with Physician:    Joanne Chars 07/15/2015 11:54 AM

## 2015-07-15 NOTE — Progress Notes (Signed)
PT'S PERSONAL MEDS OBTAINED FROM PHARMACY  AND 300 REFRIGERATOR. PT IS DISCHARGED TO TO HOME ACCOMPAINED BY SISTER AND MOTHER. HE WILL BE FOLLOW UP AS OUT PATIENT W/ [facility] FOR POSSIBLE VOLUNTARY COMMITMENT.PT ALERT AND ORIENTED AT TIME OF DISCHARGE.

## 2015-07-15 NOTE — Discharge Summary (Signed)
Physician Discharge Summary  Cody Buchanan PFY:[id number] DOB: Jan 24, 1956 DOA: 07/13/2015  PCP: [name], MD  Admit date: 07/13/2015 Discharge date: 07/15/2015     Discharge Diagnoses:  1. Altered mental status secondary to possible recurrence of brain tumor glioblastoma cocaine use 2. History seizure disorder 3. Chronic anxiety.  Discharge Condition: Stable Disposition: Home  Diet recommendation: Regular  Filed Weights   07/13/15 2046 07/14/15 0624 07/15/15 0607  Weight: 67.268 kg (148 lb 4.8 oz) 65.363 kg (144 lb 1.6 oz) 67.722 kg (149 lb 4.8 oz)    History of present illness:  This patient presented to the emergency room with a history of altered mental status over the past 3 days. He is having difficulty speaking in mental confusion.  He uses cocaine as well CT of brain in ED showed possible recurrence of tumor  Hospital Course:  The patient was admitted to [place] floor he was continued on medications listed below and monitored . He remained alert and oriented. He continued on medications listed below. CT of head showed further progression of glioblastoma and frontal lobe. This patient has an appointment with [facility] in 2 weeks he is alert and oriented vital signs are stable   Discharge Instructions Patient is discharged on medications listed below he was instructed to return to primary care physician's office if needed. He does have an appointment with [facility] in 1 week for follow-up    Medication List    ASK your doctor about these medications        acetaminophen 325 MG tablet  Commonly known as:  TYLENOL  Take 650 mg by mouth every 6 (six) hours as needed for mild pain.     albuterol 108 (90 BASE) MCG/ACT inhaler  Commonly known as:  PROVENTIL HFA;VENTOLIN HFA  Inhale 1-2 puffs into the lungs every 6 (six) hours as needed for wheezing or shortness of breath.     ALPRAZolam 1 MG tablet  Commonly known as:  XANAX  Take 1 tablet (1  mg total) by mouth 3 (three) times daily as needed for anxiety.     Bevacizumab 100 MG/4ML Soln  Commonly known as:  AVASTIN  Inject into the vein every 14 (fourteen) days.     bisacodyl 5 MG EC tablet  Commonly known as:  DULCOLAX  Take 5 mg by mouth daily as needed for moderate constipation.     etoposide 50 MG capsule  Commonly known as:  VEPESID  Take 100 mg (50 mg/m2/day) daily days 1-21 every 28 days.     ibuprofen 200 MG tablet  Commonly known as:  ADVIL,MOTRIN  Take 400 mg by mouth every 6 (six) hours as needed for mild pain. Pain     levETIRAcetam 1000 MG tablet  Commonly known as:  KEPPRA  Take 1 tablet (1,000 mg total) by mouth 2 (two) times daily.     lidocaine-prilocaine cream  Commonly known as:  EMLA  Apply a quarter size amount to port site 1 hour prior to chemo. Do not rub in. Cover with plastic wrap.     loperamide 2 MG capsule  Commonly known as:  IMODIUM  Take 2 mg by mouth as needed for diarrhea or loose stools. Take 2 tablets after first loose stool and then 1 tablet every 2 hours until you have gone 12 hours without a loose stool. If you develop loose stools during the night, take 2 tablets every 4 hours. Contact the [place] for uncontrolled loose stools.  multivitamin with minerals tablet  Take 1 tablet by mouth daily.     ondansetron 8 MG disintegrating tablet  Commonly known as:  ZOFRAN-ODT  Take 8 mg by mouth every 8 (eight) hours as needed for nausea or vomiting.     oxycodone 5 MG capsule  Commonly known as:  OXY-IR  Take 1-2 capsules (5-10 mg total) by mouth every 4 (four) hours as needed for pain.     prochlorperazine 10 MG tablet  Commonly known as:  COMPAZINE  Take 1 tablet (10 mg total) by mouth every 6 (six) hours as needed for nausea or vomiting.       No Known Allergies  The results of significant diagnostics from this hospitalization (including imaging, microbiology, ancillary and laboratory) are listed below for  reference.    Significant Diagnostic Studies: Dg Chest 2 View  07/13/2015   CLINICAL DATA:  Detox from crack, Vomiting began at 1300 today. Being treated at [facility] for Stage 4 brain tumor. History of seizures.  EXAM: CHEST  2 VIEW  COMPARISON:  10/21/2013  FINDINGS: Right-sided power port tip to level of the lower superior vena cava. Heart size is normal. There is moderate emphysema throughout the lungs, stable in appearance. There are no focal consolidations or pleural effusions. No pulmonary edema.  IMPRESSION: 1. Emphysema. 2.  No evidence for acute  abnormality.   Electronically Signed   By: Nolon Nations M.D.   On: 07/13/2015 17:56   Ct Head Wo Contrast  07/13/2015   CLINICAL DATA:  Headache and nausea today. History of glioblastoma. Status post resection 2015. Complains of memory loss.  EXAM: CT HEAD WITHOUT CONTRAST  TECHNIQUE: Contiguous axial images were obtained from the base of the skull through the vertex without intravenous contrast.  COMPARISON:  12/29/2014  FINDINGS: Again noted is encephalomalacia involving the left frontal lobe consistent for section of previous glioblastoma. However there has been development of new edema in the region of the genu of the corpus callosum and right frontal lobe, splaying the frontal horns of the lateral ventricles. There is increased edema in the region of the left temporal lobe and basal ganglia. Findings are suspicious for recurrent tumor and/or edema. Further evaluation with MRI is recommended. Hyperdense foci are identified in the left frontal region, possibly postoperative. Small is foci of hemorrhage not entirely excluded.  Bone windows show previous craniectomy changes. There has been some improvement in the appearance of chronic sinusitis though mucoperiosteal thickening persists.  IMPRESSION: 1. New low attenuation in the region of the genu of the corpus callosum and right frontal lobe, suspicious for tumor recurrence. Similar appearance could be  seen with tumor edema or posttreatment changes. 2. Further evaluation with MRI with contrast recommended. 3. Critical Value/emergent results were called by telephone at the time of interpretation on 07/13/2015 at 6:34 pm to Dr. Davonna Belling , who verbally acknowledged these results.   Electronically Signed   By: Nolon Nations M.D.   On: 07/13/2015 18:36   Mr Jeri Cos [facility] Contrast  07/14/2015   CLINICAL DATA:  59 year old male with glioblastoma resection in and April and March of 2015. Treatment reportedly at [facility]. Reportedly with recent Disease progression on [facility] brain MRI in June. Altered mental status, confusion, seizure activity  EXAM: MRI HEAD WITHOUT AND WITH CONTRAST  TECHNIQUE: Multiplanar, multiecho pulse sequences of the brain and surrounding structures were obtained without and with intravenous contrast.  CONTRAST:  62mL MULTIHANCE GADOBENATE DIMEGLUMINE 529 MG/ML IV SOLN  COMPARISON:  Summary of [facility] brain MRI findings from Oncology progress note, 06/25/2015, and earlier. [facility] brain MRI 04/19/2014. Head CT without contrast 07/13/2015.  FINDINGS: Severe progression of disease compared to 04/19/2014. Bulky "butterfly glioma" type appearance with mass like heterogeneously increased T2 and FLAIR hyperintensity extending in both anterior frontal lobes across the corpus callosum. Relatively mild associated nodular enhancement of the lesion superimposed on a small volume of intrinsic T1 hyperintensity about the left frontal horn. Confluent restricted diffusion throughout much of the dominant tumor site in keeping with hypercellularity. Regional mass effect and confluent additional bifrontal surrounding T2 and FLAIR hyperintensity in a vasogenic edema type pattern, but could reflect nonenhancing tumor in this setting.  Partial effacement of both frontal horns, but no ventriculomegaly. Small volume hemosiderin in both frontal lobes, more so the left. Overlying bifrontal craniotomy changes.   No acute intracranial hemorrhage identified. No superimposed restricted diffusion typical of acute infarct. Outside of the tumor area, no abnormal intracranial enhancement. Major intracranial vascular flow voids are stable. Bone marrow signal is normal.  Stable paranasal sinus inflammation. Orbits soft tissues appear normal. Stable mild left petrous apex fluid. Mastoids are otherwise clear. Small volume retained secretions in the nasopharynx. No acute scalp soft tissue findings. No significant extra-axial collection. Basilar cisterns remain patent. Negative pituitary, cervicomedullary junction and visualized cervical spine.  IMPRESSION: 1. Severe progression of glioblastoma compared to 04/19/2014. Bulky "Butterfly Glioma" type appearance now with extensive bifrontal nonenhancing and enhancing tumor and edema. 2. No ventriculomegaly or impending herniation. 3. No new intracranial abnormality identified.   Electronically Signed   By: Genevie Ann M.D.   On: 07/14/2015 11:20    Microbiology: No results found for this or any previous visit (from the past 240 hour(s)).   Labs: Basic Metabolic Panel:  Recent Labs Lab 07/11/15 0900 07/13/15 1730 07/14/15 0634  NA 139 138 139  K 4.1 3.9 4.1  CL 105 108 109  CO2 26 20* 24  GLUCOSE 101* 146* 129*  BUN 19 21* 17  CREATININE 1.04 0.89 0.67  CALCIUM 8.8* 8.7* 8.8*   Liver Function Tests:  Recent Labs Lab 07/11/15 0900 07/13/15 1730 07/14/15 0634  AST 15 15 15   ALT 11* 11* 10*  ALKPHOS 39 36* 36*  BILITOT 1.0 0.7 0.8  PROT 7.4 7.5 7.0  ALBUMIN 3.9 4.1 3.7   No results for input(s): LIPASE, AMYLASE in the last 168 hours. No results for input(s): AMMONIA in the last 168 hours. CBC:  Recent Labs Lab 07/11/15 0900 07/13/15 1730 07/14/15 0634  WBC 7.5 10.9* 8.7  NEUTROABS 5.3 9.8*  --   HGB 15.0 15.0 14.0  HCT 44.7 43.8 41.6  MCV 89.2 88.3 88.7  PLT 238 210 231   Cardiac Enzymes: No results for input(s): CKTOTAL, CKMB, CKMBINDEX,  TROPONINI in the last 168 hours. BNP: BNP (last 3 results) No results for input(s): BNP in the last 8760 hours.  ProBNP (last 3 results) No results for input(s): PROBNP in the last 8760 hours.  CBG: No results for input(s): GLUCAP in the last 168 hours.  Active Problems:   Brain tumor   Altered mental status   Cocaine abuse   Protein-calorie malnutrition, severe   Time coordinating discharge: 30 minutes  Signed:  Marjean Donna, MD 07/15/2015, 6:52 AM

## 2015-07-15 NOTE — Progress Notes (Signed)
Dr. Everette Rank had discharged the patient but when the family was called they said that he was brought to the hospital to be committed involuntarily because of behavioral issues are related to his brain tumor. He also has apparently continued to use crack cocaine.  I don't see anything in the notes about commitment. I will request a psychiatry consultation and if they do not think he has a problem that will require commitment he will be discharged

## 2015-07-16 ENCOUNTER — Telehealth (HOSPITAL_COMMUNITY): Payer: Self-pay | Admitting: *Deleted

## 2015-07-16 NOTE — Telephone Encounter (Signed)
He is managed at [facility].  He needs to go there.  Radiologist compared imaging tests from April [birth year]89 (over 59 year old) because his imaging has been done at [facility].  We are Ron's local support and following [facility]'s guidance.  I have nothing to offer from an oncology standpoint, other than Hospice.  Yenny Kosa 07/16/2015 4:22 PM

## 2015-07-16 NOTE — Telephone Encounter (Signed)
Patient's niece called today. States Cody Buchanan is home from hospital. She is calling on behalf of Cody Buchanan's sister who is at work. They were told during admission that patient had "Tumor growth and swelling around his brain" shown on MRI and CT's done this weekend. He is confused but denies headaches. [facility] to try to get help for his substance abuse. The family is questioning what intervention he needs based on xrays.

## 2015-07-16 NOTE — Telephone Encounter (Signed)
Patient's niece notified to let Primary oncologist at [facility] know about recent developments, per Dr. Whitney Muse

## 2015-07-17 NOTE — Telephone Encounter (Signed)
Family was notified of this info

## 2015-07-18 ENCOUNTER — Encounter: Payer: Self-pay | Admitting: *Deleted

## 2015-07-18 ENCOUNTER — Encounter (HOSPITAL_BASED_OUTPATIENT_CLINIC_OR_DEPARTMENT_OTHER): Payer: Medicaid Other

## 2015-07-18 ENCOUNTER — Encounter (HOSPITAL_COMMUNITY): Payer: Self-pay | Admitting: Oncology

## 2015-07-18 ENCOUNTER — Encounter (HOSPITAL_BASED_OUTPATIENT_CLINIC_OR_DEPARTMENT_OTHER): Payer: Medicaid Other | Admitting: Oncology

## 2015-07-18 VITALS — BP 110/70 | HR 82 | Temp 98.7°F | Resp 18

## 2015-07-18 VITALS — BP 97/71 | HR 101 | Temp 98.0°F | Resp 20 | Wt 149.2 lb

## 2015-07-18 DIAGNOSIS — C719 Malignant neoplasm of brain, unspecified: Secondary | ICD-10-CM

## 2015-07-18 DIAGNOSIS — C711 Malignant neoplasm of frontal lobe: Secondary | ICD-10-CM

## 2015-07-18 DIAGNOSIS — Z79899 Other long term (current) drug therapy: Secondary | ICD-10-CM

## 2015-07-18 DIAGNOSIS — Z5112 Encounter for antineoplastic immunotherapy: Secondary | ICD-10-CM | POA: Diagnosis not present

## 2015-07-18 DIAGNOSIS — F1490 Cocaine use, unspecified, uncomplicated: Secondary | ICD-10-CM | POA: Diagnosis not present

## 2015-07-18 DIAGNOSIS — R634 Abnormal weight loss: Secondary | ICD-10-CM | POA: Diagnosis not present

## 2015-07-18 LAB — COMPREHENSIVE METABOLIC PANEL
ALBUMIN: 3.6 g/dL (ref 3.5–5.0)
ALK PHOS: 40 U/L (ref 38–126)
ALT: 13 U/L — AB (ref 17–63)
ANION GAP: 7 (ref 5–15)
AST: 17 U/L (ref 15–41)
BILIRUBIN TOTAL: 0.9 mg/dL (ref 0.3–1.2)
BUN: 21 mg/dL — ABNORMAL HIGH (ref 6–20)
CO2: 25 mmol/L (ref 22–32)
Calcium: 8.9 mg/dL (ref 8.9–10.3)
Chloride: 108 mmol/L (ref 101–111)
Creatinine, Ser: 0.86 mg/dL (ref 0.61–1.24)
Glucose, Bld: 98 mg/dL (ref 65–99)
POTASSIUM: 3.9 mmol/L (ref 3.5–5.1)
Sodium: 140 mmol/L (ref 135–145)
Total Protein: 6.8 g/dL (ref 6.5–8.1)

## 2015-07-18 LAB — CBC WITH DIFFERENTIAL/PLATELET
BASOS PCT: 1 % (ref 0–1)
Basophils Absolute: 0 10*3/uL (ref 0.0–0.1)
EOS ABS: 0.1 10*3/uL (ref 0.0–0.7)
Eosinophils Relative: 2 % (ref 0–5)
HEMATOCRIT: 43.3 % (ref 39.0–52.0)
Hemoglobin: 14.5 g/dL (ref 13.0–17.0)
LYMPHS PCT: 22 % (ref 12–46)
Lymphs Abs: 1.6 10*3/uL (ref 0.7–4.0)
MCH: 30 pg (ref 26.0–34.0)
MCHC: 33.5 g/dL (ref 30.0–36.0)
MCV: 89.5 fL (ref 78.0–100.0)
MONO ABS: 0.2 10*3/uL (ref 0.1–1.0)
MONOS PCT: 2 % — AB (ref 3–12)
NEUTROS ABS: 5.4 10*3/uL (ref 1.7–7.7)
Neutrophils Relative %: 73 % (ref 43–77)
PLATELETS: 214 10*3/uL (ref 150–400)
RBC: 4.84 MIL/uL (ref 4.22–5.81)
RDW: 15.8 % — ABNORMAL HIGH (ref 11.5–15.5)
WBC: 7.4 10*3/uL (ref 4.0–10.5)

## 2015-07-18 LAB — RAPID URINE DRUG SCREEN, HOSP PERFORMED
Amphetamines: NOT DETECTED
BENZODIAZEPINES: NOT DETECTED
Barbiturates: NOT DETECTED
Cocaine: NOT DETECTED
Opiates: NOT DETECTED
Tetrahydrocannabinol: NOT DETECTED

## 2015-07-18 LAB — URINALYSIS, DIPSTICK ONLY
Bilirubin Urine: NEGATIVE
GLUCOSE, UA: NEGATIVE mg/dL
HGB URINE DIPSTICK: NEGATIVE
KETONES UR: NEGATIVE mg/dL
Leukocytes, UA: NEGATIVE
NITRITE: NEGATIVE
PROTEIN: NEGATIVE mg/dL
Urobilinogen, UA: 0.2 mg/dL (ref 0.0–1.0)
pH: 5.5 (ref 5.0–8.0)

## 2015-07-18 MED ORDER — SODIUM CHLORIDE 0.9 % IV SOLN
Freq: Once | INTRAVENOUS | Status: AC
  Administered 2015-07-18: 14:00:00 via INTRAVENOUS

## 2015-07-18 MED ORDER — SODIUM CHLORIDE 0.9 % IJ SOLN
10.0000 mL | INTRAMUSCULAR | Status: DC | PRN
  Administered 2015-07-18: 10 mL
  Filled 2015-07-18: qty 10

## 2015-07-18 MED ORDER — HEPARIN SOD (PORK) LOCK FLUSH 100 UNIT/ML IV SOLN
INTRAVENOUS | Status: AC
  Filled 2015-07-18: qty 5

## 2015-07-18 MED ORDER — SODIUM CHLORIDE 0.9 % IV SOLN
10.0000 mg/kg | Freq: Once | INTRAVENOUS | Status: AC
  Administered 2015-07-18: 700 mg via INTRAVENOUS
  Filled 2015-07-18: qty 24

## 2015-07-18 MED ORDER — HEPARIN SOD (PORK) LOCK FLUSH 100 UNIT/ML IV SOLN
500.0000 [IU] | Freq: Once | INTRAVENOUS | Status: AC | PRN
  Administered 2015-07-18: 500 [IU]

## 2015-07-18 NOTE — Progress Notes (Signed)
[place] Clinical Social Work  Clinical Social Work was referred by patient navigator for assessment of psychosocial needs due to knowledge of crack cocaine use by patient.  Clinical Social Worker met with patient at [facility] in treatment room to offer support and assess for needs.  Unfortunately, other patient was present and it was difficult to have a conversation about recent drug use. CSW did confront pt with his "bad choices" and discussed current plans for treatment. He reports he has a follow up appointment at [facility], but could not remember when.   CSW has concerns with progression of his disease pt may need more caregiving assistance than he is leading [place] to believe. CSW has made several suggestions in the past, as far as additional supports in the community, but pt has declined these options. CSW has strongly encouraged pt to attend Brain Group at [facility]. This group is for patients and caregivers due to the extreme caregiving issues that present for our brain patients. CSW to mail flyer for Brain Group to pt and family and encourage them to attend August group. CSW will also mention possible future referral for PCS, CNA services for additional support at home. Pt agrees to [place] following closely and plans to be more open with CSW.  Clinical Social Work interventions: Resource education Supportive listening Loren Racer, [place] Tuesdays 8:30-1pm Wednesdays 8:30-12pm  [phone]

## 2015-07-18 NOTE — Patient Instructions (Addendum)
Cody Buchanan at [facility] Discharge Instructions  RECOMMENDATIONS MADE BY THE CONSULTANT AND ANY TEST RESULTS WILL BE SENT TO YOUR REFERRING PHYSICIAN.  Exam completed by Kirby Crigler today Treatment as scheduled today Hold etoposide restart on 07/25/2015 Follow up with the doctor in 2 weeks  Please call the clinic if you have any questions or concerns   Thank you for choosing [place] at [facility] to provide your oncology and hematology care.  To afford each patient quality time with our provider, please arrive at least 15 minutes before your scheduled appointment time.    You need to re-schedule your appointment should you arrive 10 or more minutes late.  We strive to give you quality time with our providers, and arriving late affects you and other patients whose appointments are after yours.  Also, if you no show three or more times for appointments you may be dismissed from the clinic at the providers discretion.     Again, thank you for choosing [facility].  Our hope is that these requests will decrease the amount of time that you wait before being seen by our physicians.       _____________________________________________________________  Should you have questions after your visit to [facility], please contact our office at [phone] between the hours of 8:30 a.m. and 4:30 p.m.  Voicemails left after 4:30 p.m. will not be returned until the following business day.  For prescription refill requests, have your pharmacy contact our office.

## 2015-07-18 NOTE — Progress Notes (Signed)
Tolerated well

## 2015-07-18 NOTE — Assessment & Plan Note (Addendum)
With recent progression of disease.  Change in therapy by Jarrett Soho to Etoposide 50 mg/m2/day and continuation of current Avastin dose/schedule.  He started on 6/27 (we had planned on him starting today with instructions) taking Etoposide 50 mg daily.  He is taking the medication in the evening.  He denies any side effects of toxicities at this time.    He will start tonight taking Etoposide 100 mg daily.  He will complete his 3 weeks of treatment on 7/18.  He will have about 9 Etoposide capsules left over at that time.  Despite this education on his last visit, he has continued Etoposide so he ia advised to hold the remaining etoposide pills for this cycle.  He will restart in 1 week (07/25/2015)  He has relapsed to crack use.  He admits to smoking last on Friday 7/15, which lead to his hospitalization.  Will perform urine drug screen today.  His weight has declined, but it is difficult to decipher whether this is disease related or crack/cocaine related.  He is to continue with Ensure supplementation.   Return in 2 weeks for Avastin and follow-up.

## 2015-07-18 NOTE — Progress Notes (Signed)
Cody [place], [place] [id number]  Malignant brain tumor, left frontal glioblastoma multiforme grade 4 - Plan: Urine rapid drug screen (hosp performed)  CURRENT THERAPY: Avastin and Etoposide 50 mg/m2/day on days 1-21 q 28days.  INTERVAL HISTORY: Cody Buchanan 59 y.o. male returns for followup of Glioblastoma.  Oncology History   03/03/2014 Partial resection, [name], [place], Dr. [name] 04/07/2014 Re-resection at [place], Dr. [name]     Malignant brain tumor, left frontal glioblastoma multiforme grade 4   03/03/2014 Initial Diagnosis Malignant brain tumor, left frontal glioblastoma multiforme grade 4, [place], [place]. partial resection, Dr. [name]   04/07/2014 Surgery Re-resection at [place], Dr. Derrill Memo   05/10/2014 - 06/21/2014 Radiation Therapy By Dr. Sondra Come with 6000 cGy in 30 fractions to left frontal brain   07/07/2014 -  Chemotherapy Temodar x5 days every 4 weeks for 2 cycles +2 infusions of patient's own lymphocytes.   08/23/2014 Progression MRI showed progression at [facility]   09/27/2014 - 04/18/2015 Chemotherapy Avastin started with irinotecan added on 10/11/2014    12/06/2014 Imaging MRI brain at [facility]- Decreased enhancement, edema, and mass effect. This may represent pseudoresponse related to interval chemotherapy versus true response.   02/07/2015 Imaging MRI brain at [facility]- Increasing nodular enhancement along the medial aspect of the left frontalresection cavity.   04/04/2015 Progression MRI brain at [facility]- Slight interval increase in nodular enhancement within the genu of thecorpus callosum and increased size of a nodular focus of enhancement medialto the resection cavity.    05/09/2015 - 06/06/2015 Chemotherapy Carboplatin AUC 4 Q 28 days, AVASTIN 10 mg/kg q 2 weeks   06/25/2015 Progression MRI brain at [facility]- Significant interval increase in size of a peripheral enhancing mass at theposterior aspect  of the resection site with elements of diffusionrestriction which may represent hypercellular tumor. Consistent withdisease progression.   06/25/2015 -  Chemotherapy Etoposide PO 50 mg/m2/day on days 1-21 every 28 days.  Avastin every 2 weeks continued.  (He started Etoposide as dated before he was advised to and he only was taking 50 mg daily).   He admits to smoking crack.  He has a history of abuse in the past.  He notes that he started back to smoking after his father's passing.  He was admitted to the hospital on 7/15- 07/15/2015 with cocaine abuse.  He notes that his last use of the drug was on Friday 7/15.  He notes that he is able to stay clean.  There is question regarding progression of disease on his last MRI during his hospitalization for altered mental status.  Because his primary oncology care is provided at [facility], they have been performing all imaging follow-up scans.  The one performed locally on 07/14/2015 was compared to scans on 04/19/2014.  Therefore we are unable to determine progression or stability of disease.  Past Medical History  Diagnosis Date  . Glioblastoma   . GERD (gastroesophageal reflux disease)   . Seizures   . Anxiety 09/07/2014    has Malignant brain tumor, left frontal glioblastoma multiforme grade 4; Chronic obstructive pulmonary disease; Seizure disorder; Allergic rhinitis; Acute gouty arthritis; Anxiety; Brain tumor; Altered mental status; Cocaine abuse; and Protein-calorie malnutrition, severe on his problem list.     has No Known Allergies.  Current Outpatient Prescriptions on File Prior to Visit  Medication Sig Dispense Refill  . acetaminophen (TYLENOL) 325 MG tablet Take 650 mg by mouth every  6 (six) hours as needed for mild pain.     Marland Kitchen albuterol (PROVENTIL HFA;VENTOLIN HFA) 108 (90 BASE) MCG/ACT inhaler Inhale 1-2 puffs into the lungs every 6 (six) hours as needed for wheezing or shortness of breath.     . ALPRAZolam (XANAX) 1 MG tablet Take 1 tablet (1  mg total) by mouth 3 (three) times daily as needed for anxiety. 60 tablet 1  . Bevacizumab (AVASTIN) 100 MG/4ML SOLN Inject into the vein every 14 (fourteen) days.     . bisacodyl (DULCOLAX) 5 MG EC tablet Take 5 mg by mouth daily as needed for moderate constipation.    Marland Kitchen etoposide (VEPESID) 50 MG capsule Take 100 mg (50 mg/m2/day) daily days 1-21 every 28 days. (Patient taking differently: Take 50 mg/m2/day by mouth 2 (two) times daily. Take 100 mg (50 mg/m2/day) daily days 1-21 every 28 days.) 42 capsule 0  . ibuprofen (ADVIL,MOTRIN) 200 MG tablet Take 400 mg by mouth every 6 (six) hours as needed for mild pain. Pain    . levETIRAcetam (KEPPRA) 1000 MG tablet Take 1 tablet (1,000 mg total) by mouth 2 (two) times daily. 60 tablet 1  . lidocaine-prilocaine (EMLA) cream Apply a quarter size amount to port site 1 hour prior to chemo. Do not rub in. Cover with plastic wrap. 30 g 3  . loperamide (IMODIUM) 2 MG capsule Take 2 mg by mouth as needed for diarrhea or loose stools. Take 2 tablets after first loose stool and then 1 tablet every 2 hours until you have gone 12 hours without a loose stool. If you develop loose stools during the night, take 2 tablets every 4 hours. Contact the [facility] for uncontrolled loose stools.    . Multiple Vitamins-Minerals (MULTIVITAMIN WITH MINERALS) tablet Take 1 tablet by mouth daily.    . ondansetron (ZOFRAN-ODT) 8 MG disintegrating tablet Take 8 mg by mouth every 8 (eight) hours as needed for nausea or vomiting.    Marland Kitchen oxycodone (OXY-IR) 5 MG capsule Take 1-2 capsules (5-10 mg total) by mouth every 4 (four) hours as needed for pain. 100 capsule 0  . prochlorperazine (COMPAZINE) 10 MG tablet Take 1 tablet (10 mg total) by mouth every 6 (six) hours as needed for nausea or vomiting. 30 tablet 1   Current Facility-Administered Medications on File Prior to Visit  Medication Dose Route Frequency Provider Last Rate Last Dose  . sodium chloride 0.9 % injection 10 mL  10 mL  Intravenous PRN Patrici Ranks, MD   10 mL at 01/24/15 1114    Past Surgical History  Procedure Laterality Date  . Salivary gland surgery    . Brain tumor excision      Denies any headaches, dizziness, double vision, fevers, chills, night sweats, nausea, vomiting, diarrhea, constipation, chest pain, heart palpitations, shortness of breath, blood in stool, black tarry stool, urinary pain, urinary burning, urinary frequency, hematuria.   PHYSICAL EXAMINATION  ECOG PERFORMANCE STATUS: 1 - Symptomatic but completely ambulatory  Filed Vitals:   07/18/15 1001  BP: 97/71  Pulse: 101  Temp: 98 F (36.7 C)  Resp: 20    GENERAL:alert, no distress, well nourished, well developed, comfortable, cooperative and smiling SKIN: skin color, texture, turgor are normal, no rashes or significant lesions HEAD: Normocephalic, No masses, lesions, tenderness or abnormalities EYES: constricted pupils B/L EARS: External ears normal OROPHARYNX:mucous membranes are moist  NECK: supple, no adenopathy, thyroid normal size, non-tender, without nodularity, no stridor, non-tender, trachea midline LYMPH:  no palpable lymphadenopathy  BREAST:not examined LUNGS: clear to auscultation  HEART: regular rate & rhythm, no murmurs, no gallops, S1 normal and S2 normal ABDOMEN:abdomen soft, non-tender and normal bowel sounds BACK: Back symmetric, no curvature. EXTREMITIES:less then 2 second capillary refill, no joint deformities, effusion, or inflammation, no skin discoloration, no cyanosis  NEURO: alert & oriented x 3 with fluent speech, no focal motor/sensory deficits, gait normal   LABORATORY DATA: CBC    Component Value Date/Time   WBC 8.7 07/14/2015 0634   RBC 4.69 07/14/2015 0634   HGB 14.0 07/14/2015 0634   HCT 41.6 07/14/2015 0634   PLT 231 07/14/2015 0634   MCV 88.7 07/14/2015 0634   MCH 29.9 07/14/2015 0634   MCHC 33.7 07/14/2015 0634   RDW 15.7* 07/14/2015 0634   LYMPHSABS 0.6* 07/13/2015 1730     MONOABS 0.5 07/13/2015 1730   EOSABS 0.0 07/13/2015 1730   BASOSABS 0.0 07/13/2015 1730      Chemistry      Component Value Date/Time   NA 139 07/14/2015 0634   K 4.1 07/14/2015 0634   CL 109 07/14/2015 0634   CO2 24 07/14/2015 0634   BUN 17 07/14/2015 0634   CREATININE 0.67 07/14/2015 0634      Component Value Date/Time   CALCIUM 8.8* 07/14/2015 0634   ALKPHOS 36* 07/14/2015 0634   AST 15 07/14/2015 0634   ALT 10* 07/14/2015 0634   BILITOT 0.8 07/14/2015 0634        PENDING LABS:   RADIOGRAPHIC STUDIES:  Dg Chest 2 View  07/13/2015   CLINICAL DATA:  Detox from crack, Vomiting began at 1300 today. Being treated at [facility] for Stage 4 brain tumor. History of seizures.  EXAM: CHEST  2 VIEW  COMPARISON:  10/21/2013  FINDINGS: Right-sided power port tip to level of the lower superior vena cava. Heart size is normal. There is moderate emphysema throughout the lungs, stable in appearance. There are no focal consolidations or pleural effusions. No pulmonary edema.  IMPRESSION: 1. Emphysema. 2.  No evidence for acute  abnormality.   Electronically Signed   By: Nolon Nations M.D.   On: 07/13/2015 17:56   Ct Head Wo Contrast  07/13/2015   CLINICAL DATA:  Headache and nausea today. History of glioblastoma. Status post resection 2015. Complains of memory loss.  EXAM: CT HEAD WITHOUT CONTRAST  TECHNIQUE: Contiguous axial images were obtained from the base of the skull through the vertex without intravenous contrast.  COMPARISON:  12/29/2014  FINDINGS: Again noted is encephalomalacia involving the left frontal lobe consistent for section of previous glioblastoma. However there has been development of new edema in the region of the genu of the corpus callosum and right frontal lobe, splaying the frontal horns of the lateral ventricles. There is increased edema in the region of the left temporal lobe and basal ganglia. Findings are suspicious for recurrent tumor and/or edema. Further evaluation  with MRI is recommended. Hyperdense foci are identified in the left frontal region, possibly postoperative. Small is foci of hemorrhage not entirely excluded.  Bone windows show previous craniectomy changes. There has been some improvement in the appearance of chronic sinusitis though mucoperiosteal thickening persists.  IMPRESSION: 1. New low attenuation in the region of the genu of the corpus callosum and right frontal lobe, suspicious for tumor recurrence. Similar appearance could be seen with tumor edema or posttreatment changes. 2. Further evaluation with MRI with contrast recommended. 3. Critical Value/emergent results were called by telephone at the time of interpretation on 07/13/2015  at 6:34 pm to Dr. Davonna Belling , who verbally acknowledged these results.   Electronically Signed   By: Nolon Nations M.D.   On: 07/13/2015 18:36   Mr Jeri Cos ZO Contrast  07/14/2015   CLINICAL DATA:  59 year old male with glioblastoma resection in and April and March of 2015. Treatment reportedly at [facility]. Reportedly with recent Disease progression on [facility] brain MRI in June. Altered mental status, confusion, seizure activity  EXAM: MRI HEAD WITHOUT AND WITH CONTRAST  TECHNIQUE: Multiplanar, multiecho pulse sequences of the brain and surrounding structures were obtained without and with intravenous contrast.  CONTRAST:  59mL MULTIHANCE GADOBENATE DIMEGLUMINE 529 MG/ML IV SOLN  COMPARISON:  Summary of [facility] brain MRI findings from Oncology progress note, 06/25/2015, and earlier. [facility] brain MRI 04/19/2014. Head CT without contrast 07/13/2015.  FINDINGS: Severe progression of disease compared to 04/19/2014. Bulky "butterfly glioma" type appearance with mass like heterogeneously increased T2 and FLAIR hyperintensity extending in both anterior frontal lobes across the corpus callosum. Relatively mild associated nodular enhancement of the lesion superimposed on a small volume of intrinsic T1 hyperintensity about  the left frontal horn. Confluent restricted diffusion throughout much of the dominant tumor site in keeping with hypercellularity. Regional mass effect and confluent additional bifrontal surrounding T2 and FLAIR hyperintensity in a vasogenic edema type pattern, but could reflect nonenhancing tumor in this setting.  Partial effacement of both frontal horns, but no ventriculomegaly. Small volume hemosiderin in both frontal lobes, more so the left. Overlying bifrontal craniotomy changes.  No acute intracranial hemorrhage identified. No superimposed restricted diffusion typical of acute infarct. Outside of the tumor area, no abnormal intracranial enhancement. Major intracranial vascular flow voids are stable. Bone marrow signal is normal.  Stable paranasal sinus inflammation. Orbits soft tissues appear normal. Stable mild left petrous apex fluid. Mastoids are otherwise clear. Small volume retained secretions in the nasopharynx. No acute scalp soft tissue findings. No significant extra-axial collection. Basilar cisterns remain patent. Negative pituitary, cervicomedullary junction and visualized cervical spine.  IMPRESSION: 1. Severe progression of glioblastoma compared to 04/19/2014. Bulky "Butterfly Glioma" type appearance now with extensive bifrontal nonenhancing and enhancing tumor and edema. 2. No ventriculomegaly or impending herniation. 3. No new intracranial abnormality identified.   Electronically Signed   By: Genevie Ann M.D.   On: 07/14/2015 11:20     PATHOLOGY:    ASSESSMENT AND PLAN:  Malignant brain tumor, left frontal glioblastoma multiforme grade 4 With recent progression of disease.  Change in therapy by Jarrett Soho to Etoposide 50 mg/m2/day and continuation of current Avastin dose/schedule.  He started on 6/27 (we had planned on him starting today with instructions) taking Etoposide 50 mg daily.  He is taking the medication in the evening.  He denies any side effects of toxicities at this  time.    He will start tonight taking Etoposide 100 mg daily.  He will complete his 3 weeks of treatment on 7/18.  He will have about 9 Etoposide capsules left over at that time.  Despite this education on his last visit, he has continued Etoposide so he ia advised to hold the remaining etoposide pills for this cycle.  He will restart in 1 week (07/25/2015)  He has relapsed to crack use.  He admits to smoking last on Friday 7/15, which lead to his hospitalization.  Will perform urine drug screen today.  His weight has declined, but it is difficult to decipher whether this is disease related or crack/cocaine related.  He  is to continue with Ensure supplementation.   Return in 2 weeks for Avastin and follow-up.      THERAPY PLAN:  Will treat today and continue with treatment plan as prescribed by [facility].  He has an upcoming follow-[facility] appointment at [facility]  All questions were answered. The patient knows to call the clinic with any problems, questions or concerns. We can certainly see the patient much sooner if necessary.  Patient and plan discussed with Dr. Ancil Linsey and she is in agreement with the aforementioned.   This note is electronically signed by: Doy Mince 07/18/2015 10:53 AM

## 2015-07-18 NOTE — Patient Instructions (Signed)
Instructions printed from PA-C encounter.

## 2015-07-25 ENCOUNTER — Other Ambulatory Visit (HOSPITAL_COMMUNITY): Payer: Medicaid Other

## 2015-08-01 ENCOUNTER — Encounter (HOSPITAL_COMMUNITY): Payer: Medicaid Other | Attending: Hematology and Oncology

## 2015-08-01 ENCOUNTER — Encounter (HOSPITAL_BASED_OUTPATIENT_CLINIC_OR_DEPARTMENT_OTHER): Payer: Medicaid Other | Admitting: Oncology

## 2015-08-01 ENCOUNTER — Encounter (HOSPITAL_COMMUNITY): Payer: Self-pay | Admitting: Oncology

## 2015-08-01 VITALS — BP 126/83 | HR 90 | Temp 98.0°F | Resp 18 | Wt 151.0 lb

## 2015-08-01 VITALS — BP 123/75 | HR 71 | Temp 97.9°F | Resp 16

## 2015-08-01 DIAGNOSIS — C711 Malignant neoplasm of frontal lobe: Secondary | ICD-10-CM

## 2015-08-01 DIAGNOSIS — C719 Malignant neoplasm of brain, unspecified: Secondary | ICD-10-CM

## 2015-08-01 DIAGNOSIS — F1490 Cocaine use, unspecified, uncomplicated: Secondary | ICD-10-CM | POA: Diagnosis not present

## 2015-08-01 DIAGNOSIS — Z5112 Encounter for antineoplastic immunotherapy: Secondary | ICD-10-CM

## 2015-08-01 DIAGNOSIS — R634 Abnormal weight loss: Secondary | ICD-10-CM

## 2015-08-01 DIAGNOSIS — Z79899 Other long term (current) drug therapy: Secondary | ICD-10-CM

## 2015-08-01 LAB — COMPREHENSIVE METABOLIC PANEL
ALBUMIN: 3.7 g/dL (ref 3.5–5.0)
ALT: 12 U/L — ABNORMAL LOW (ref 17–63)
AST: 19 U/L (ref 15–41)
Alkaline Phosphatase: 46 U/L (ref 38–126)
Anion gap: 9 (ref 5–15)
BILIRUBIN TOTAL: 0.6 mg/dL (ref 0.3–1.2)
BUN: 21 mg/dL — ABNORMAL HIGH (ref 6–20)
CHLORIDE: 102 mmol/L (ref 101–111)
CO2: 22 mmol/L (ref 22–32)
Calcium: 8.6 mg/dL — ABNORMAL LOW (ref 8.9–10.3)
Creatinine, Ser: 0.69 mg/dL (ref 0.61–1.24)
GFR calc Af Amer: >60 mL/min (ref >60)
GFR calc non Af Amer: >60 mL/min (ref >60)
GLUCOSE: 122 mg/dL — AB (ref 65–99)
Potassium: 3.8 mmol/L (ref 3.5–5.1)
SODIUM: 133 mmol/L — AB (ref 135–145)
Total Protein: 6.6 g/dL (ref 6.5–8.1)

## 2015-08-01 LAB — URINALYSIS, DIPSTICK ONLY
BILIRUBIN URINE: NEGATIVE
GLUCOSE, UA: NEGATIVE mg/dL
Hgb urine dipstick: NEGATIVE
Ketones, ur: NEGATIVE mg/dL
LEUKOCYTES UA: NEGATIVE
Nitrite: NEGATIVE
Protein, ur: NEGATIVE mg/dL
Specific Gravity, Urine: 1.025 (ref 1.005–1.030)
Urobilinogen, UA: 0.2 mg/dL (ref 0.0–1.0)
pH: 5.5 (ref 5.0–8.0)

## 2015-08-01 LAB — CBC WITH DIFFERENTIAL/PLATELET
Basophils Absolute: 0 10*3/uL (ref 0.0–0.1)
Basophils Relative: 0 % (ref 0–1)
EOS PCT: 2 % (ref 0–5)
Eosinophils Absolute: 0.1 10*3/uL (ref 0.0–0.7)
HCT: 39 % (ref 39.0–52.0)
HEMOGLOBIN: 13.4 g/dL (ref 13.0–17.0)
LYMPHS PCT: 26 % (ref 12–46)
Lymphs Abs: 1.8 10*3/uL (ref 0.7–4.0)
MCH: 30.4 pg (ref 26.0–34.0)
MCHC: 34.4 g/dL (ref 30.0–36.0)
MCV: 88.4 fL (ref 78.0–100.0)
MONOS PCT: 2 % — AB (ref 3–12)
Monocytes Absolute: 0.2 10*3/uL (ref 0.1–1.0)
Neutro Abs: 4.8 10*3/uL (ref 1.7–7.7)
Neutrophils Relative %: 70 % (ref 43–77)
Platelets: 223 10*3/uL (ref 150–400)
RBC: 4.41 MIL/uL (ref 4.22–5.81)
RDW: 16.4 % — AB (ref 11.5–15.5)
WBC: 6.8 10*3/uL (ref 4.0–10.5)

## 2015-08-01 LAB — RAPID URINE DRUG SCREEN, HOSP PERFORMED
Amphetamines: NOT DETECTED
Barbiturates: NOT DETECTED
Benzodiazepines: NOT DETECTED
COCAINE: NOT DETECTED
OPIATES: NOT DETECTED
Tetrahydrocannabinol: NOT DETECTED

## 2015-08-01 MED ORDER — SODIUM CHLORIDE 0.9 % IJ SOLN: 10.0000 mL | INTRAMUSCULAR | Status: DC | PRN

## 2015-08-01 MED ORDER — HEPARIN SOD (PORK) LOCK FLUSH 100 UNIT/ML IV SOLN
INTRAVENOUS | Status: AC
  Filled 2015-08-01: qty 5

## 2015-08-01 MED ORDER — OXYCODONE HCL 5 MG PO CAPS: 5.0000 mg | ORAL_CAPSULE | ORAL | Status: DC | PRN

## 2015-08-01 MED ORDER — ALPRAZOLAM 1 MG PO TABS
1.0000 mg | ORAL_TABLET | Freq: Three times a day (TID) | ORAL | Status: DC | PRN
Start: 2015-08-01 — End: 2015-09-05

## 2015-08-01 MED ORDER — SODIUM CHLORIDE 0.9 % IV SOLN
10.0000 mg/kg | Freq: Once | INTRAVENOUS | Status: AC
  Administered 2015-08-01: 700 mg via INTRAVENOUS
  Filled 2015-08-01: qty 24

## 2015-08-01 MED ORDER — HEPARIN SOD (PORK) LOCK FLUSH 100 UNIT/ML IV SOLN
500.0000 [IU] | Freq: Once | INTRAVENOUS | Status: AC | PRN
  Administered 2015-08-01: 500 [IU]

## 2015-08-01 MED ORDER — ETOPOSIDE 50 MG PO CAPS: ORAL_CAPSULE | ORAL | Status: AC

## 2015-08-01 MED ORDER — SODIUM CHLORIDE 0.9 % IV SOLN
Freq: Once | INTRAVENOUS | Status: AC
  Administered 2015-08-01: 14:00:00 via INTRAVENOUS

## 2015-08-01 NOTE — Patient Instructions (Addendum)
[facility] Discharge Instructions for Patients Receiving Chemotherapy  Today you received the following chemotherapy agents:  Avastin  If you develop nausea and vomiting, or diarrhea that is not controlled by your medication, call the clinic.  We will refill your etoposide today (it will arrive to you in the mail). Referral for [facility] should be made by your primary care provider. Return in 2 weeks for Avastin and office visit.  The clinic phone number is [phone]. Office hours are Monday-Friday 8:30am-5:00pm.  BELOW ARE SYMPTOMS THAT SHOULD BE REPORTED IMMEDIATELY:  *FEVER GREATER THAN 101.0 F  *CHILLS WITH OR WITHOUT FEVER  NAUSEA AND VOMITING THAT IS NOT CONTROLLED WITH YOUR NAUSEA MEDICATION  *UNUSUAL SHORTNESS OF BREATH  *UNUSUAL BRUISING OR BLEEDING  TENDERNESS IN MOUTH AND THROAT WITH OR WITHOUT PRESENCE OF ULCERS  *URINARY PROBLEMS  *BOWEL PROBLEMS  UNUSUAL RASH Items with * indicate a potential emergency and should be followed up as soon as possible. If you have an emergency after office hours please contact your primary care physician or go to the nearest emergency department.  Please call the clinic during office hours if you have any questions or concerns.   You may also contact the Patient Navigator at [phone] should you have any questions or need assistance in obtaining follow up care. _____________________________________________________________________ Have you asked about our STAR program?    STAR stands for Survivorship Training and Rehabilitation, and this is a nationally recognized cancer care program that focuses on survivorship and rehabilitation.  Cancer and cancer treatments may cause problems, such as, pain, making you feel tired and keeping you from doing the things that you need or want to do. Cancer rehabilitation can help. Our goal is to reduce these troubling effects and help you have the best quality of life  possible.  You may receive a survey from a nurse that asks questions about your current state of health.  Based on the survey results, all eligible patients will be referred to the [facility] program for an evaluation so we can better serve you! A frequently asked questions sheet is available upon request.

## 2015-08-01 NOTE — Assessment & Plan Note (Addendum)
With recent progression of disease.  Change in therapy by Jarrett Soho to Etoposide 50 mg/m2/day and continuation of current Avastin dose/schedule.  He started oral Etoposide on 6/27 at 50 mg daily.  We had hoped he would not start until he was seen on 07/04/2015 so we could help guide his treatment, but he began the medication on his own without our knowledge.  When he was seen on 07/04/2015, his Etoposide dose was increased to 100 mg daily and he was to completed his three weeks of Etoposide on 07/16/2015, but he continued the medication (due to the left over pills from when he was only taking 50 mg daily) until he was seen on 07/18/2015 at which time he was advised to hold the medication.    He was to restart Etoposide 100 mg on 07/25/2015, but he did not.  A new Rx is written today.  He will call us when he starts Etoposide for documentation sake.  Oral Etoposide has been very difficult to manage due to patient not following verbal and written directions.  He has relapsed to crack use.  He admits to smoking last on Friday 7/15, which lead to his hospitalization.  Will perform urine drug screen today.  He requests referral to [facility] substance abuse program.  I will defer to his primary care provider.  His weight has improved and he is up 2 lbs since 7/20 and up 7 lbs since 7/16.  He is to continue with Ensure supplementation.   Return in 2 weeks for Avastin and follow-up.  Refill on Xanax and Oxy IR

## 2015-08-01 NOTE — Progress Notes (Signed)
Cody Hampshire, MD [place] [address]  Malignant brain tumor, left frontal glioblastoma multiforme grade 4 - Plan: Urine rapid drug screen (hosp performed), oxycodone (OXY-IR) 5 MG capsule, ALPRAZolam (XANAX) 1 MG tablet, etoposide (VEPESID) 50 MG capsule  CURRENT THERAPY: Avastin and Etoposide 50 mg/m2/day on days 1-21 q 28days.  INTERVAL HISTORY: Cody Buchanan 59 y.o. male returns for followup of Glioblastoma.  Oncology History   03/03/2014 Partial resection, [name], [place], Dr. [name] 04/07/2014 Re-resection at [place], Dr. [name]     Malignant brain tumor, left frontal glioblastoma multiforme grade 4   03/03/2014 Initial Diagnosis Malignant brain tumor, left frontal glioblastoma multiforme grade 4, [place], [place]. partial resection, Dr. [name]   04/07/2014 Surgery Re-resection at [place], Dr. Derrill Memo   05/10/2014 - 06/21/2014 Radiation Therapy By Dr. Sondra Come with 6000 cGy in 30 fractions to left frontal brain   07/07/2014 -  Chemotherapy Temodar x5 days every 4 weeks for 2 cycles +2 infusions of patient's own lymphocytes.   08/23/2014 Progression MRI showed progression at [facility]   09/27/2014 - 04/18/2015 Chemotherapy Avastin started with irinotecan added on 10/11/2014    12/06/2014 Imaging MRI brain at [facility]- Decreased enhancement, edema, and mass effect. This may represent pseudoresponse related to interval chemotherapy versus true response.   02/07/2015 Imaging MRI brain at [facility]- Increasing nodular enhancement along the medial aspect of the left frontalresection cavity.   04/04/2015 Progression MRI brain at [facility]- Slight interval increase in nodular enhancement within the genu of thecorpus callosum and increased size of a nodular focus of enhancement medialto the resection cavity.    05/09/2015 - 06/06/2015 Chemotherapy Carboplatin AUC 4 Q 28 days, AVASTIN 10 mg/kg q 2 weeks   06/25/2015 Progression MRI brain  at [facility]- Significant interval increase in size of a peripheral enhancing mass at theposterior aspect of the resection site with elements of diffusionrestriction which may represent hypercellular tumor. Consistent withdisease progression.   06/25/2015 -  Chemotherapy Etoposide PO 50 mg/m2/day on days 1-21 every 28 days.  Avastin every 2 weeks continued.  (He started Etoposide as dated before he was advised to and he only was taking 50 mg daily).   He admits to smoking crack.  He has a history of abuse in the past.  He notes that he started back to smoking after his father's passing.  He was admitted to the hospital on 7/15- 07/15/2015 with cocaine abuse.  He notes that his last use of the drug was on Friday 7/15.  He notes that he is able to stay clean.  He is accompanied by his mother today who is suddenly playing a more active role in his medication management.  He did not start his Etoposide as scheduled on 7/27 as planned.  I have written a new Rx for the medication and he will call us when he gets the medication.  Management of the patient's oral Etoposide has been challenging as he has not been following directions, verbal and written, regarding how it is taken, dosing, and schedule.    His mother asks, "Is everything going to be ok with Ron?"  I asked her to clarify what she means by this questions.  She is informed that most do not survive his diagnosis, and continued failure of therapies is a poor prognostic sign.  He is not likely to be cured of this disease and it will cause his demise one day.  She was under the impression that he was curable because the physician at [facility] told them some people survive this diagnosis.  I have deferred further conversations in this regard to [facility] as they are managing his malignancy and we serve as the patient's local support.  He requests a referral to [facility] Substance abuse program.  I will defer this to his primary care provider.   Past Medical History    Diagnosis Date  . Glioblastoma   . GERD (gastroesophageal reflux disease)   . Seizures   . Anxiety 09/07/2014    has Malignant brain tumor, left frontal glioblastoma multiforme grade 4; Chronic obstructive pulmonary disease; Seizure disorder; Allergic rhinitis; Acute gouty arthritis; Anxiety; Brain tumor; Altered mental status; Cocaine abuse; and Protein-calorie malnutrition, severe on his problem list.     has No Known Allergies.  Current Outpatient Prescriptions on File Prior to Visit  Medication Sig Dispense Refill  . acetaminophen (TYLENOL) 325 MG tablet Take 650 mg by mouth every 6 (six) hours as needed for mild pain.     Marland Kitchen albuterol (PROVENTIL HFA;VENTOLIN HFA) 108 (90 BASE) MCG/ACT inhaler Inhale 1-2 puffs into the lungs every 6 (six) hours as needed for wheezing or shortness of breath.     . Bevacizumab (AVASTIN) 100 MG/4ML SOLN Inject into the vein every 14 (fourteen) days.     . bisacodyl (DULCOLAX) 5 MG EC tablet Take 5 mg by mouth daily as needed for moderate constipation.    Marland Kitchen ibuprofen (ADVIL,MOTRIN) 200 MG tablet Take 400 mg by mouth every 6 (six) hours as needed for mild pain. Pain    . levETIRAcetam (KEPPRA) 1000 MG tablet Take 1 tablet (1,000 mg total) by mouth 2 (two) times daily. 60 tablet 1  . lidocaine-prilocaine (EMLA) cream Apply a quarter size amount to port site 1 hour prior to chemo. Do not rub in. Cover with plastic wrap. 30 g 3  . loperamide (IMODIUM) 2 MG capsule Take 2 mg by mouth as needed for diarrhea or loose stools. Take 2 tablets after first loose stool and then 1 tablet every 2 hours until you have gone 12 hours without a loose stool. If you develop loose stools during the night, take 2 tablets every 4 hours. Contact the [facility] for uncontrolled loose stools.    . Multiple Vitamins-Minerals (MULTIVITAMIN WITH MINERALS) tablet Take 1 tablet by mouth daily.    . ondansetron (ZOFRAN-ODT) 8 MG disintegrating tablet Take 8 mg by mouth every 8 (eight) hours  as needed for nausea or vomiting.    . prochlorperazine (COMPAZINE) 10 MG tablet Take 1 tablet (10 mg total) by mouth every 6 (six) hours as needed for nausea or vomiting. 30 tablet 1   Current Facility-Administered Medications on File Prior to Visit  Medication Dose Route Frequency Provider Last Rate Last Dose  . sodium chloride 0.9 % injection 10 mL  10 mL Intravenous PRN Patrici Ranks, MD   10 mL at 01/24/15 1114    Past Surgical History  Procedure Laterality Date  . Salivary gland surgery    . Brain tumor excision      Denies any headaches, dizziness, double vision, fevers, chills, night sweats, nausea, vomiting, diarrhea, constipation, chest pain, heart palpitations, shortness of breath, blood in stool, black tarry stool, urinary pain, urinary burning, urinary frequency, hematuria.   PHYSICAL EXAMINATION  ECOG PERFORMANCE STATUS: 1 - Symptomatic but completely ambulatory  Filed Vitals:   08/01/15 0929  BP: 126/83  Pulse: 90  Temp:  36 F (36.7 C)  Resp: 18    GENERAL:alert, no distress, well nourished, well developed, comfortable, cooperative and smiling, accompanied by his mother. SKIN: skin color, texture, turgor are normal, no rashes or significant lesions HEAD: Normocephalic, No masses, lesions, tenderness or abnormalities EYES: PERRLA EARS: External ears normal OROPHARYNX:mucous membranes are moist  NECK: supple, no adenopathy, thyroid normal size, non-tender, without nodularity, no stridor, non-tender, trachea midline LYMPH:  no palpable lymphadenopathy BREAST:not examined LUNGS: clear to auscultation  HEART: regular rate & rhythm, no murmurs, no gallops, S1 normal and S2 normal ABDOMEN:abdomen soft, non-tender and normal bowel sounds BACK: Back symmetric, no curvature. EXTREMITIES:less then 2 second capillary refill, no joint deformities, effusion, or inflammation, no skin discoloration, no cyanosis  NEURO: alert & oriented x 3 with fluent speech, no focal  motor/sensory deficits, gait normal   LABORATORY DATA: CBC    Component Value Date/Time   WBC 7.4 07/18/2015 1133   RBC 4.84 07/18/2015 1133   HGB 14.5 07/18/2015 1133   HCT 43.3 07/18/2015 1133   PLT 214 07/18/2015 1133   MCV 89.5 07/18/2015 1133   MCH 30.0 07/18/2015 1133   MCHC 33.5 07/18/2015 1133   RDW 15.8* 07/18/2015 1133   LYMPHSABS 1.6 07/18/2015 1133   MONOABS 0.2 07/18/2015 1133   EOSABS 0.1 07/18/2015 1133   BASOSABS 0.0 07/18/2015 1133      Chemistry      Component Value Date/Time   NA 140 07/18/2015 1133   K 3.9 07/18/2015 1133   CL 108 07/18/2015 1133   CO2 25 07/18/2015 1133   BUN 21* 07/18/2015 1133   CREATININE 0.86 07/18/2015 1133      Component Value Date/Time   CALCIUM 8.9 07/18/2015 1133   ALKPHOS 40 07/18/2015 1133   AST 17 07/18/2015 1133   ALT 13* 07/18/2015 1133   BILITOT 0.9 07/18/2015 1133        PENDING LABS:   RADIOGRAPHIC STUDIES:  Dg Chest 2 View  07/13/2015   CLINICAL DATA:  Detox from crack, Vomiting began at 1300 today. Being treated at [facility] for Stage 4 brain tumor. History of seizures.  EXAM: CHEST  2 VIEW  COMPARISON:  10/21/2013  FINDINGS: Right-sided power port tip to level of the lower superior vena cava. Heart size is normal. There is moderate emphysema throughout the lungs, stable in appearance. There are no focal consolidations or pleural effusions. No pulmonary edema.  IMPRESSION: 1. Emphysema. 2.  No evidence for acute  abnormality.   Electronically Signed   By: Nolon Nations M.D.   On: 07/13/2015 17:56   Ct Head Wo Contrast  07/13/2015   CLINICAL DATA:  Headache and nausea today. History of glioblastoma. Status post resection 2015. Complains of memory loss.  EXAM: CT HEAD WITHOUT CONTRAST  TECHNIQUE: Contiguous axial images were obtained from the base of the skull through the vertex without intravenous contrast.  COMPARISON:  12/29/2014  FINDINGS: Again noted is encephalomalacia involving the left frontal lobe  consistent for section of previous glioblastoma. However there has been development of new edema in the region of the genu of the corpus callosum and right frontal lobe, splaying the frontal horns of the lateral ventricles. There is increased edema in the region of the left temporal lobe and basal ganglia. Findings are suspicious for recurrent tumor and/or edema. Further evaluation with MRI is recommended. Hyperdense foci are identified in the left frontal region, possibly postoperative. Small is foci of hemorrhage not entirely excluded.  Bone windows show previous  craniectomy changes. There has been some improvement in the appearance of chronic sinusitis though mucoperiosteal thickening persists.  IMPRESSION: 1. New low attenuation in the region of the genu of the corpus callosum and right frontal lobe, suspicious for tumor recurrence. Similar appearance could be seen with tumor edema or posttreatment changes. 2. Further evaluation with MRI with contrast recommended. 3. Critical Value/emergent results were called by telephone at the time of interpretation on 07/13/2015 at 6:34 pm to Dr. Davonna Belling , who verbally acknowledged these results.   Electronically Signed   By: Nolon Nations M.D.   On: 07/13/2015 18:36   Mr Jeri Cos GX Contrast  07/14/2015   CLINICAL DATA:  59 year old male with glioblastoma resection in and April and March of 2015. Treatment reportedly at [facility]. Reportedly with recent Disease progression on [facility] brain MRI in June. Altered mental status, confusion, seizure activity  EXAM: MRI HEAD WITHOUT AND WITH CONTRAST  TECHNIQUE: Multiplanar, multiecho pulse sequences of the brain and surrounding structures were obtained without and with intravenous contrast.  CONTRAST:  60mL MULTIHANCE GADOBENATE DIMEGLUMINE 529 MG/ML IV SOLN  COMPARISON:  Summary of [facility] brain MRI findings from Oncology progress note, 06/25/2015, and earlier. [facility] brain MRI 04/19/2014. Head CT without contrast  07/13/2015.  FINDINGS: Severe progression of disease compared to 04/19/2014. Bulky "butterfly glioma" type appearance with mass like heterogeneously increased T2 and FLAIR hyperintensity extending in both anterior frontal lobes across the corpus callosum. Relatively mild associated nodular enhancement of the lesion superimposed on a small volume of intrinsic T1 hyperintensity about the left frontal horn. Confluent restricted diffusion throughout much of the dominant tumor site in keeping with hypercellularity. Regional mass effect and confluent additional bifrontal surrounding T2 and FLAIR hyperintensity in a vasogenic edema type pattern, but could reflect nonenhancing tumor in this setting.  Partial effacement of both frontal horns, but no ventriculomegaly. Small volume hemosiderin in both frontal lobes, more so the left. Overlying bifrontal craniotomy changes.  No acute intracranial hemorrhage identified. No superimposed restricted diffusion typical of acute infarct. Outside of the tumor area, no abnormal intracranial enhancement. Major intracranial vascular flow voids are stable. Bone marrow signal is normal.  Stable paranasal sinus inflammation. Orbits soft tissues appear normal. Stable mild left petrous apex fluid. Mastoids are otherwise clear. Small volume retained secretions in the nasopharynx. No acute scalp soft tissue findings. No significant extra-axial collection. Basilar cisterns remain patent. Negative pituitary, cervicomedullary junction and visualized cervical spine.  IMPRESSION: 1. Severe progression of glioblastoma compared to 04/19/2014. Bulky "Butterfly Glioma" type appearance now with extensive bifrontal nonenhancing and enhancing tumor and edema. 2. No ventriculomegaly or impending herniation. 3. No new intracranial abnormality identified.   Electronically Signed   By: Genevie Ann M.D.   On: 07/14/2015 11:20     PATHOLOGY:    ASSESSMENT AND PLAN:  Malignant brain tumor, left frontal  glioblastoma multiforme grade 4 With recent progression of disease.  Change in therapy by Jarrett Soho to Etoposide 50 mg/m2/day and continuation of current Avastin dose/schedule.  He started oral Etoposide on 6/27 at 50 mg daily.  We had hoped he would not start until he was seen on 07/04/2015 so we could help guide his treatment, but he began the medication on his own without our knowledge.  When he was seen on 07/04/2015, his Etoposide dose was increased to 100 mg daily and he was to completed his three weeks of Etoposide on 07/16/2015, but he continued the medication (due to the left over pills  from when he was only taking 50 mg daily) until he was seen on 07/18/2015 at which time he was advised to hold the medication.    He was to restart Etoposide 100 mg on 07/25/2015, but he did not.  A new Rx is written today.  He will call us when he starts Etoposide for documentation sake.  Oral Etoposide has been very difficult to manage due to patient not following verbal and written directions.  He has relapsed to crack use.  He admits to smoking last on Friday 7/15, which lead to his hospitalization.  Will perform urine drug screen today.  He requests referral to [facility] substance abuse program.  I will defer to his primary care provider.  His weight has improved and he is up 2 lbs since 7/20 and up 7 lbs since 7/16.  He is to continue with Ensure supplementation.   Return in 2 weeks for Avastin and follow-up.  Refill on Xanax and Oxy IR    THERAPY PLAN:  Will treat today and continue with treatment plan as prescribed by [facility].  He has an upcoming follow-up appointment at [facility]  All questions were answered. The patient knows to call the clinic with any problems, questions or concerns. We can certainly see the patient much sooner if necessary.  Patient and plan discussed with Dr. Ancil Linsey and she is in agreement with the aforementioned.   This note is electronically signed by: Doy Mince 08/01/2015 10:39 AM

## 2015-08-01 NOTE — Patient Instructions (Signed)
[facility] Discharge Instructions for Patients Receiving Chemotherapy  Today you received the following chemotherapy agents:  Avastin  If you develop nausea and vomiting, or diarrhea that is not controlled by your medication, call the clinic.  We will refill your etoposide today (it will arrive to you in the mail). Referral for [facility] should be made by your primary care provider. Return in 2 weeks for Avastin and office visit.  The clinic phone number is [phone]. Office hours are Monday-Friday 8:30am-5:00pm.  BELOW ARE SYMPTOMS THAT SHOULD BE REPORTED IMMEDIATELY:  *FEVER GREATER THAN 101.0 F  *CHILLS WITH OR WITHOUT FEVER  NAUSEA AND VOMITING THAT IS NOT CONTROLLED WITH YOUR NAUSEA MEDICATION  *UNUSUAL SHORTNESS OF BREATH  *UNUSUAL BRUISING OR BLEEDING  TENDERNESS IN MOUTH AND THROAT WITH OR WITHOUT PRESENCE OF ULCERS  *URINARY PROBLEMS  *BOWEL PROBLEMS  UNUSUAL RASH Items with * indicate a potential emergency and should be followed up as soon as possible. If you have an emergency after office hours please contact your primary care physician or go to the nearest emergency department.  Please call the clinic during office hours if you have any questions or concerns.   You may also contact the Patient Navigator at [phone] should you have any questions or need assistance in obtaining follow up care. _____________________________________________________________________ Have you asked about our STAR program?    STAR stands for Survivorship Training and Rehabilitation, and this is a nationally recognized cancer care program that focuses on survivorship and rehabilitation.  Cancer and cancer treatments may cause problems, such as, pain, making you feel tired and keeping you from doing the things that you need or want to do. Cancer rehabilitation can help. Our goal is to reduce these troubling effects and help you have the best quality of life  possible.  You may receive a survey from a nurse that asks questions about your current state of health.  Based on the survey results, all eligible patients will be referred to the [facility] program for an evaluation so we can better serve you! A frequently asked questions sheet is available upon request.

## 2015-08-01 NOTE — Progress Notes (Signed)
[place] chemotherapy well  Discharged ambulatory

## 2015-08-07 ENCOUNTER — Other Ambulatory Visit (HOSPITAL_COMMUNITY): Payer: Self-pay | Admitting: Hematology & Oncology

## 2015-08-08 ENCOUNTER — Other Ambulatory Visit (HOSPITAL_COMMUNITY): Payer: Medicaid Other

## 2015-08-16 ENCOUNTER — Encounter (HOSPITAL_BASED_OUTPATIENT_CLINIC_OR_DEPARTMENT_OTHER): Payer: Medicaid Other

## 2015-08-16 ENCOUNTER — Encounter (HOSPITAL_COMMUNITY): Payer: Self-pay | Admitting: Oncology

## 2015-08-16 ENCOUNTER — Encounter (HOSPITAL_BASED_OUTPATIENT_CLINIC_OR_DEPARTMENT_OTHER): Payer: Medicaid Other | Admitting: Oncology

## 2015-08-16 VITALS — BP 114/81 | HR 65 | Temp 98.2°F | Resp 16

## 2015-08-16 VITALS — BP 113/77 | HR 91 | Temp 98.0°F | Resp 16 | Wt 153.6 lb

## 2015-08-16 DIAGNOSIS — Z5112 Encounter for antineoplastic immunotherapy: Secondary | ICD-10-CM

## 2015-08-16 DIAGNOSIS — C711 Malignant neoplasm of frontal lobe: Secondary | ICD-10-CM

## 2015-08-16 DIAGNOSIS — C719 Malignant neoplasm of brain, unspecified: Secondary | ICD-10-CM

## 2015-08-16 DIAGNOSIS — F1410 Cocaine abuse, uncomplicated: Secondary | ICD-10-CM

## 2015-08-16 DIAGNOSIS — Z79899 Other long term (current) drug therapy: Secondary | ICD-10-CM

## 2015-08-16 LAB — COMPREHENSIVE METABOLIC PANEL
ALBUMIN: 3.7 g/dL (ref 3.5–5.0)
ALK PHOS: 42 U/L (ref 38–126)
ALT: 15 U/L — ABNORMAL LOW (ref 17–63)
AST: 17 U/L (ref 15–41)
Anion gap: 6 (ref 5–15)
BUN: 20 mg/dL (ref 6–20)
CALCIUM: 8.6 mg/dL — AB (ref 8.9–10.3)
CHLORIDE: 103 mmol/L (ref 101–111)
CO2: 24 mmol/L (ref 22–32)
CREATININE: 0.77 mg/dL (ref 0.61–1.24)
GFR calc Af Amer: >60 mL/min (ref >60)
GFR calc non Af Amer: >60 mL/min (ref >60)
GLUCOSE: 92 mg/dL (ref 65–99)
Potassium: 4 mmol/L (ref 3.5–5.1)
SODIUM: 133 mmol/L — AB (ref 135–145)
Total Bilirubin: 1.2 mg/dL (ref 0.3–1.2)
Total Protein: 6.7 g/dL (ref 6.5–8.1)

## 2015-08-16 LAB — CBC WITH DIFFERENTIAL/PLATELET
BASOS ABS: 0.1 10*3/uL (ref 0.0–0.1)
Basophils Relative: 1 % (ref 0–1)
EOS ABS: 0.1 10*3/uL (ref 0.0–0.7)
EOS PCT: 3 % (ref 0–5)
HCT: 37.4 % — ABNORMAL LOW (ref 39.0–52.0)
Hemoglobin: 12.8 g/dL — ABNORMAL LOW (ref 13.0–17.0)
LYMPHS ABS: 1.2 10*3/uL (ref 0.7–4.0)
Lymphocytes Relative: 23 % (ref 12–46)
MCH: 31.3 pg (ref 26.0–34.0)
MCHC: 34.2 g/dL (ref 30.0–36.0)
MCV: 91.4 fL (ref 78.0–100.0)
Monocytes Absolute: 0.3 10*3/uL (ref 0.1–1.0)
Monocytes Relative: 5 % (ref 3–12)
NEUTROS PCT: 68 % (ref 43–77)
Neutro Abs: 3.7 10*3/uL (ref 1.7–7.7)
PLATELETS: 214 10*3/uL (ref 150–400)
RBC: 4.09 MIL/uL — ABNORMAL LOW (ref 4.22–5.81)
RDW: 18.1 % — ABNORMAL HIGH (ref 11.5–15.5)
WBC: 5.4 10*3/uL (ref 4.0–10.5)

## 2015-08-16 LAB — URINALYSIS, DIPSTICK ONLY
BILIRUBIN URINE: NEGATIVE
Glucose, UA: NEGATIVE mg/dL
HGB URINE DIPSTICK: NEGATIVE
Ketones, ur: NEGATIVE mg/dL
Leukocytes, UA: NEGATIVE
NITRITE: NEGATIVE
PROTEIN: NEGATIVE mg/dL
SPECIFIC GRAVITY, URINE: 1.02 (ref 1.005–1.030)
Urobilinogen, UA: 0.2 mg/dL (ref 0.0–1.0)
pH: 5.5 (ref 5.0–8.0)

## 2015-08-16 MED ORDER — SODIUM CHLORIDE 0.9 % IV SOLN
Freq: Once | INTRAVENOUS | Status: AC
  Administered 2015-08-16: 12:00:00 via INTRAVENOUS

## 2015-08-16 MED ORDER — ONDANSETRON 8 MG PO TBDP: 8.0000 mg | ORAL_TABLET | Freq: Three times a day (TID) | ORAL | Status: AC | PRN

## 2015-08-16 MED ORDER — HEPARIN SOD (PORK) LOCK FLUSH 100 UNIT/ML IV SOLN
500.0000 [IU] | Freq: Once | INTRAVENOUS | Status: AC | PRN
  Administered 2015-08-16: 500 [IU]
  Filled 2015-08-16: qty 5

## 2015-08-16 MED ORDER — OXYCODONE HCL 5 MG PO CAPS: 5.0000 mg | ORAL_CAPSULE | ORAL | Status: DC | PRN

## 2015-08-16 MED ORDER — SODIUM CHLORIDE 0.9 % IJ SOLN: 10.0000 mL | INTRAMUSCULAR | Status: DC | PRN

## 2015-08-16 MED ORDER — SODIUM CHLORIDE 0.9 % IV SOLN
10.0000 mg/kg | Freq: Once | INTRAVENOUS | Status: AC
  Administered 2015-08-16: 675 mg via INTRAVENOUS
  Filled 2015-08-16: qty 20

## 2015-08-16 NOTE — Assessment & Plan Note (Addendum)
With recent progression of disease.  Change in therapy by Jarrett Soho to Etoposide 50 mg/m2/day and continuation of current Avastin dose/schedule.  He started oral Etoposide on 6/27 at 50 mg daily.  We had hoped he would not start until he was seen on 07/04/2015 so we could help guide his treatment, but he began the medication on his own without our knowledge.  When he was seen on 07/04/2015, his Etoposide dose was increased to 100 mg daily and he was to completed his three weeks of Etoposide on 07/16/2015, but he continued the medication (due to the left over pills from when he was only taking 50 mg daily) until he was seen on 07/18/2015 at which time he was advised to hold the medication.  He was to restart Etoposide 100 mg on 07/25/2015, but he did not.  A new Rx was written.  His mother is now incharge of his medication administration and does not accompany him today.  His niece is with him and she confirms that he is taking 100 mg of Etoposide as prescribed, start date of cycle 2 is unknown.  He did not call us when he started his Etoposide for documentation sake.  Oral Etoposide has been very difficult to manage due to patient not following verbal and written directions.  He has relapsed to crack use.  He admits to smoking last on Friday 7/15, which lead to his hospitalization.    His weight has improved and he is up to 153 lbs.  He is to continue with Ensure supplementation.   He returns to [facility] for repeat imaging and follow-up on 8/29.  We will see him back shortly thereafter and follow recommendations from [facility].  Clinically, he is declining and I suspect progression of his disease.  However, since his primary oncologist is at [facility], we will follow their recommendations at this time.  Refill on Oxy IR and Zofran.  Both Rx printed.

## 2015-08-16 NOTE — Progress Notes (Signed)
Tolerated well

## 2015-08-16 NOTE — Patient Instructions (Signed)
[place] at [facility] Discharge Instructions  RECOMMENDATIONS MADE BY THE CONSULTANT AND ANY TEST RESULTS WILL BE SENT TO YOUR REFERRING PHYSICIAN. Exam and discussion by Robynn Pane, PA-C No changes at present. Refills: Oxycondone - take as directed for pain  Ondansetron refilled - take as directed for nausea  Call with issues or concerns. Keep your appointment at [facility] and we will see you afterwards.   Thank you for choosing [place] at [facility] to provide your oncology and hematology care.  To afford each patient quality time with our provider, please arrive at least 15 minutes before your scheduled appointment time.    You need to re-schedule your appointment should you arrive 10 or more minutes late.  We strive to give you quality time with our providers, and arriving late affects you and other patients whose appointments are after yours.  Also, if you no show three or more times for appointments you may be dismissed from the clinic at the providers discretion.     Again, thank you for choosing [facility].  Our hope is that these requests will decrease the amount of time that you wait before being seen by our physicians.       _____________________________________________________________  Should you have questions after your visit to [facility], please contact our office at [phone] between the hours of 8:30 a.m. and 4:30 p.m.  Voicemails left after 4:30 p.m. will not be returned until the following business day.  For prescription refill requests, have your pharmacy contact our office.

## 2015-08-16 NOTE — Patient Instructions (Signed)
[place] at [facility] Discharge Instructions  RECOMMENDATIONS MADE BY THE CONSULTANT AND ANY TEST RESULTS WILL BE SENT TO YOUR REFERRING PHYSICIAN.  avastin today Follow up as scheduled Please call the clinic if you have any questions or concerns  Thank you for choosing [place] at [facility] to provide your oncology and hematology care.  To afford each patient quality time with our provider, please arrive at least 15 minutes before your scheduled appointment time.    You need to re-schedule your appointment should you arrive 10 or more minutes late.  We strive to give you quality time with our providers, and arriving late affects you and other patients whose appointments are after yours.  Also, if you no show three or more times for appointments you may be dismissed from the clinic at the providers discretion.     Again, thank you for choosing [facility].  Our hope is that these requests will decrease the amount of time that you wait before being seen by our physicians.       _____________________________________________________________  Should you have questions after your visit to [facility] please contact our office at [phone] between the hours of 8:30 a.m. and 4:30 p.m.  Voicemails left after 4:30 p.m. will not be returned until the following business day.  For prescription refill requests, have your pharmacy contact our office.

## 2015-08-16 NOTE — Progress Notes (Signed)
[place] [facility] Tol[facility] chemotherapy well discharged ambulatory

## 2015-08-16 NOTE — Progress Notes (Signed)
[name], MD [place] [address]  Malignant brain tumor, left frontal glioblastoma multiforme grade 4 - Plan: oxycodone (OXY-IR) 5 MG capsule, ondansetron (ZOFRAN-ODT) 8 MG disintegrating tablet  CURRENT THERAPY: Avastin and Etoposide 50 mg/m2/day on days 1-21 q 28days.  INTERVAL HISTORY: KEYTON BHAT 59 y.o. male returns for followup of Glioblastoma.  Oncology History   03/03/2014 Partial resection, [name], [place], Dr. [name] 04/07/2014 Re-resection at [facility], Dr. [name]     Malignant brain tumor, left frontal glioblastoma multiforme grade 4   03/03/2014 Initial Diagnosis Malignant brain tumor, left frontal glioblastoma multiforme grade 4, [place], [place]. partial resection, Dr. [name]   04/07/2014 Surgery Re-resection at [place], Dr. Derrill Memo   05/10/2014 - 06/21/2014 Radiation Therapy By Dr. Sondra Come with 6000 cGy in 30 fractions to left frontal brain   07/07/2014 -  Chemotherapy Temodar x5 days every 4 weeks for 2 cycles +2 infusions of patient's own lymphocytes.   08/23/2014 Progression MRI showed progression at [facility]   09/27/2014 - 04/18/2015 Chemotherapy Avastin started with irinotecan added on 10/11/2014    12/06/2014 Imaging MRI brain at [facility]- Decreased enhancement, edema, and mass effect. This may represent pseudoresponse related to interval chemotherapy versus true response.   02/07/2015 Imaging MRI brain at [facility]- Increasing nodular enhancement along the medial aspect of the left frontalresection cavity.   04/04/2015 Progression MRI brain at [facility]- Slight interval increase in nodular enhancement within the genu of thecorpus callosum and increased size of a nodular focus of enhancement medialto the resection cavity.    05/09/2015 - 06/06/2015 Chemotherapy Carboplatin AUC 4 Q 28 days, AVASTIN 10 mg/kg q 2 weeks   06/25/2015 Progression MRI brain at [facility]- Significant interval increase in size of a  peripheral enhancing mass at theposterior aspect of the resection site with elements of diffusionrestriction which may represent hypercellular tumor. Consistent withdisease progression.   06/25/2015 -  Chemotherapy Etoposide PO 50 mg/m2/day on days 1-21 every 28 days.  Avastin every 2 weeks continued.  (He started Etoposide as dated before he was advised to and he only was taking 50 mg daily).   He admits to smoking crack.  He has a history of abuse in the past.  He notes that he started back to smoking after his father's passing.  He was admitted to the hospital on 7/15- 07/15/2015 with cocaine abuse.  He notes that his last use of the drug was on Friday 7/15.  He notes that he is able to stay clean.  Re-cap of previous note: He is accompanied by his mother today who is suddenly playing a more active role in his medication management.  He did not start his Etoposide as scheduled on 7/27 as planned.  I have written a new Rx for the medication and he will call us when he gets the medication.  Management of the patient's oral Etoposide has been challenging as he has not been following directions, verbal and written, regarding how it is taken, dosing, and schedule.    His mother asks, "Is everything going to be ok with Ron?"  I asked her to clarify what she means by this questions.  She is informed that most do not survive his diagnosis, and continued failure of therapies is a poor prognostic sign.  He is not likely to be cured of this disease and it will cause his demise one day.  She was under the impression that  he was curable because the physician at [facility] told them some people survive this diagnosis.  I have deferred further conversations in this regard to [facility] as they are managing his malignancy and we serve as the patient's local support.  He requests a referral to [facility] Substance abuse program.  I will defer this to his primary care provider.  Torin is accompanied by his niece today.  He is NAD.   He appears older today than he has in the past.  His mother is in charge of his medications now.  I do not know when he started his Etoposide because she is not here with him today and we never received a telephone call from the patient or his family as we requested to inform us of his start date.   His weight is up another 2-3 lbs.    Mini-mental status exam was performed.  He did not answer a few questions correctly and/or struggled with the accurate answers.  I suspect progression of disease.  He goes to [facility] on 8/29.   Past Medical History  Diagnosis Date  . Glioblastoma   . GERD (gastroesophageal reflux disease)   . Seizures   . Anxiety 09/07/2014    has Malignant brain tumor, left frontal glioblastoma multiforme grade 4; Chronic obstructive pulmonary disease; Seizure disorder; Allergic rhinitis; Acute gouty arthritis; Anxiety; Brain tumor; Altered mental status; Cocaine abuse; and Protein-calorie malnutrition, severe on his problem list.     has No Known Allergies.  Current Outpatient Prescriptions on File Prior to Visit  Medication Sig Dispense Refill  . acetaminophen (TYLENOL) 325 MG tablet Take 650 mg by mouth every 6 (six) hours as needed for mild pain.     Marland Kitchen albuterol (PROVENTIL HFA;VENTOLIN HFA) 108 (90 BASE) MCG/ACT inhaler Inhale 1-2 puffs into the lungs every 6 (six) hours as needed for wheezing or shortness of breath.     . Bevacizumab (AVASTIN) 100 MG/4ML SOLN Inject into the vein every 14 (fourteen) days.     . bisacodyl (DULCOLAX) 5 MG EC tablet Take 5 mg by mouth daily as needed for moderate constipation.    Marland Kitchen etoposide (VEPESID) 50 MG capsule Take 100 mg (50 mg/m2/day) daily days 1-21 every 28 days. 42 capsule 0  . ibuprofen (ADVIL,MOTRIN) 200 MG tablet Take 400 mg by mouth every 6 (six) hours as needed for mild pain. Pain    . levETIRAcetam (KEPPRA) 1000 MG tablet TAKE 1 TABLET BY MOUTH TWICE DAILY. CONTINUE TAKING MEDICATION UNTIL CLEARED BY DOCTOR. 60 tablet 3  .  lidocaine-prilocaine (EMLA) cream Apply a quarter size amount to port site 1 hour prior to chemo. Do not rub in. Cover with plastic wrap. 30 g 3  . loperamide (IMODIUM) 2 MG capsule Take 2 mg by mouth as needed for diarrhea or loose stools. Take 2 tablets after first loose stool and then 1 tablet every 2 hours until you have gone 12 hours without a loose stool. If you develop loose stools during the night, take 2 tablets every 4 hours. Contact the [facility] for uncontrolled loose stools.    . Multiple Vitamins-Minerals (MULTIVITAMIN WITH MINERALS) tablet Take 1 tablet by mouth daily.    Marland Kitchen ALPRAZolam (XANAX) 1 MG tablet Take 1 tablet (1 mg total) by mouth 3 (three) times daily as needed for anxiety. (Patient not taking: Reported on 08/16/2015) 60 tablet 1  . prochlorperazine (COMPAZINE) 10 MG tablet Take 1 tablet (10 mg total) by mouth every 6 (six) hours as needed  for nausea or vomiting. (Patient not taking: Reported on 08/16/2015) 30 tablet 1   Current Facility-Administered Medications on File Prior to Visit  Medication Dose Route Frequency Provider Last Rate Last Dose  . sodium chloride 0.9 % injection 10 mL  10 mL Intravenous PRN Patrici Ranks, MD   10 mL at 01/24/15 1114    Past Surgical History  Procedure Laterality Date  . Salivary gland surgery    . Brain tumor excision    . Portacath placement Right 10/2014    Denies any headaches, dizziness, double vision, fevers, chills, night sweats, nausea, vomiting, diarrhea, constipation, chest pain, heart palpitations, shortness of breath, blood in stool, black tarry stool, urinary pain, urinary burning, urinary frequency, hematuria.   PHYSICAL EXAMINATION  ECOG PERFORMANCE STATUS: 1 - Symptomatic but completely ambulatory  Filed Vitals:   08/16/15 0925  BP: 113/77  Pulse: 91  Temp: 98 F (36.7 C)  Resp: 16    GENERAL:alert, no distress, well nourished, well developed, comfortable, cooperative and smiling, accompanied by his  niece, Barnett Applebaum. SKIN: skin color, texture, turgor are normal, no rashes or significant lesions HEAD: Normocephalic, No masses, lesions, tenderness or abnormalities EYES: PERRLA EARS: External ears normal OROPHARYNX:mucous membranes are moist  NECK: supple, no adenopathy, thyroid normal size, non-tender, without nodularity, no stridor, non-tender, trachea midline LYMPH:  no palpable lymphadenopathy BREAST:not examined LUNGS: clear to auscultation  HEART: regular rate & rhythm, no murmurs, no gallops, S1 normal and S2 normal ABDOMEN:abdomen soft, non-tender and normal bowel sounds BACK: Back symmetric, no curvature. EXTREMITIES:less then 2 second capillary refill, no joint deformities, effusion, or inflammation, no skin discoloration, no cyanosis  NEURO: alert & oriented x 3 with fluent speech, no focal motor/sensory deficits, gait normal.  He accurately identifies the day of week (Thurs), reports that it is the 10th month of the year (8th), notes that it is 2016, the president of [place] he struggles with the answer but notes that it is Obama.  Running for president is [employer]  He is unable to report her competition (Trump).  Strength is 5/5 B/L.  He is able to touch his nose to my moving finger.   LABORATORY DATA: CBC    Component Value Date/Time   WBC 6.8 08/01/2015 1323   RBC 4.41 08/01/2015 1323   HGB 13.4 08/01/2015 1323   HCT 39.0 08/01/2015 1323   PLT 223 08/01/2015 1323   MCV 88.4 08/01/2015 1323   MCH 30.4 08/01/2015 1323   MCHC 34.4 08/01/2015 1323   RDW 16.4* 08/01/2015 1323   LYMPHSABS 1.8 08/01/2015 1323   MONOABS 0.2 08/01/2015 1323   EOSABS 0.1 08/01/2015 1323   BASOSABS 0.0 08/01/2015 1323      Chemistry      Component Value Date/Time   NA 133* 08/01/2015 1323   K 3.8 08/01/2015 1323   CL 102 08/01/2015 1323   CO2 22 08/01/2015 1323   BUN 21* 08/01/2015 1323   CREATININE 0.69 08/01/2015 1323      Component Value Date/Time   CALCIUM 8.6* 08/01/2015 1323    ALKPHOS 46 08/01/2015 1323   AST 19 08/01/2015 1323   ALT 12* 08/01/2015 1323   BILITOT 0.6 08/01/2015 1323        PENDING LABS:   RADIOGRAPHIC STUDIES:  No results found.   PATHOLOGY:    ASSESSMENT AND PLAN:  Malignant brain tumor, left frontal glioblastoma multiforme grade 4 With recent progression of disease.  Change in therapy by Jarrett Soho to Etoposide 50 mg/m2/day  and continuation of current Avastin dose/schedule.  He started oral Etoposide on 6/27 at 50 mg daily.  We had hoped he would not start until he was seen on 07/04/2015 so we could help guide his treatment, but he began the medication on his own without our knowledge.  When he was seen on 07/04/2015, his Etoposide dose was increased to 100 mg daily and he was to completed his three weeks of Etoposide on 07/16/2015, but he continued the medication (due to the left over pills from when he was only taking 50 mg daily) until he was seen on 07/18/2015 at which time he was advised to hold the medication.  He was to restart Etoposide 100 mg on 07/25/2015, but he did not.  A new Rx was written.  His mother is now incharge of his medication administration and does not accompany him today.  His niece is with him and she confirms that he is taking 100 mg of Etoposide as prescribed, start date of cycle 2 is unknown.  He did not call us when he started his Etoposide for documentation sake.  Oral Etoposide has been very difficult to manage due to patient not following verbal and written directions.  He has relapsed to crack use.  He admits to smoking last on Friday 7/15, which lead to his hospitalization.    His weight has improved and he is up to 153 lbs.  He is to continue with Ensure supplementation.   He returns to [facility] for repeat imaging and follow-up on 8/29.  We will see him back shortly thereafter and follow recommendations from [facility]  Clinically, he is declining and I suspect progression of his disease.  However,  since his primary oncologist is at [facility], we will follow their recommendations at this time.  Refill on Oxy IR and Zofran.  Both Rx printed.    THERAPY PLAN:  Will treat today and continue with treatment plan as prescribed by [facility].  He has an upcoming follow-up appointment at [facility] on 08/27/2015.  All questions were answered. The patient knows to call the clinic with any problems, questions or concerns. We can certainly see the patient much sooner if necessary.  Patient and plan discussed with Dr. Ancil Linsey and she is in agreement with the aforementioned.   This note is electronically signed by: Doy Mince 08/16/2015 9:58 AM

## 2015-08-27 ENCOUNTER — Telehealth (HOSPITAL_COMMUNITY): Payer: Self-pay | Admitting: Oncology

## 2015-08-27 NOTE — Telephone Encounter (Signed)
Myriam Jacobson, NP from [facility] called explaining that [name] has frank progression of disease.  She notes that they spent a lot of time discussing Hospice.  The conversation ended with Cody Buchanan and his family considering this option.  She notes that if he is adamant about treatment, next line option would be [place], but she admits that she explained to the patient that it has a very low likelihood of benefit and a high likelihood of side effects.    I will see Cody Buchanan back as scheduled at which time I will discuss Hospice further with him and his family.  KEFALAS,THOMAS 08/27/2015 12:24 PM

## 2015-08-29 ENCOUNTER — Encounter (HOSPITAL_COMMUNITY): Payer: Self-pay | Admitting: Oncology

## 2015-08-29 ENCOUNTER — Encounter (HOSPITAL_BASED_OUTPATIENT_CLINIC_OR_DEPARTMENT_OTHER): Payer: Medicaid Other | Admitting: Oncology

## 2015-08-29 ENCOUNTER — Encounter (HOSPITAL_COMMUNITY): Payer: Self-pay | Admitting: Lab

## 2015-08-29 VITALS — BP 122/73 | HR 78 | Temp 98.0°F | Resp 18 | Wt 153.9 lb

## 2015-08-29 DIAGNOSIS — G4700 Insomnia, unspecified: Secondary | ICD-10-CM

## 2015-08-29 DIAGNOSIS — C711 Malignant neoplasm of frontal lobe: Secondary | ICD-10-CM | POA: Diagnosis not present

## 2015-08-29 DIAGNOSIS — C719 Malignant neoplasm of brain, unspecified: Secondary | ICD-10-CM

## 2015-08-29 MED ORDER — OXYCODONE HCL 5 MG PO CAPS: 5.0000 mg | ORAL_CAPSULE | ORAL | Status: DC | PRN

## 2015-08-29 MED ORDER — TEMAZEPAM 30 MG PO CAPS: 30.0000 mg | ORAL_CAPSULE | Freq: Every evening | ORAL | Status: AC | PRN

## 2015-08-29 NOTE — Assessment & Plan Note (Signed)
Progression of Disease.  Myriam Jacobson, NP from [place] called on Monday.  Reported progression of disease and recommending Hospice.  Hospice discussed today.  Patient and Niece are interested in their services.   Referral to [facility].   Rx for Restoril 30-60 mg at [facility] to help with insomnia.  Rx for Oxy-IR refill.  Return PRN.

## 2015-08-29 NOTE — Patient Instructions (Signed)
[place] at [facility] Discharge Instructions  RECOMMENDATIONS MADE BY THE CONSULTANT AND ANY TEST RESULTS WILL BE SENT TO YOUR REFERRING PHYSICIAN.    Exam completed by [place] referral Please call the clinic if you have any questions or concerns as needed     Thank you for choosing [place] at [facility] to provide your oncology and hematology care.  To afford each patient quality time with our provider, please arrive at least 15 minutes before your scheduled appointment time.    You need to re-schedule your appointment should you arrive 10 or more minutes late.  We strive to give you quality time with our providers, and arriving late affects you and other patients whose appointments are after yours.  Also, if you no show three or more times for appointments you may be dismissed from the clinic at the providers discretion.     Again, thank you for choosing [facility].  Our hope is that these requests will decrease the amount of time that you wait before being seen by our physicians.       _____________________________________________________________  Should you have questions after your visit to [facility], please contact our office at [phone] between the hours of 8:30 a.m. and 4:30 p.m.  Voicemails left after 4:30 p.m. will not be returned until the following business day.  For prescription refill requests, have your pharmacy contact our office.

## 2015-08-29 NOTE — Progress Notes (Signed)
Cody Hampshire, MD [name] [address]  Malignant brain tumor, left frontal glioblastoma multiforme grade 4 - Plan: oxycodone (OXY-IR) 5 MG capsule  Insomnia - Plan: temazepam (RESTORIL) 30 MG capsule  CURRENT THERAPY: Referral to Hospice  INTERVAL HISTORY: Cody Buchanan 59 y.o. male returns for followup of Glioblastoma, progressive and refractory to treatment..  Oncology History   03/03/2014 [place] [facility] Dr. Rene Paci Buchanan 04/07/2014 Re-resection at [place], Dr. Yetta Buchanan     Malignant brain tumor, left frontal glioblastoma multiforme grade 4   03/03/2014 Initial Diagnosis Malignant brain tumor, left frontal glioblastoma multiforme grade 4, [place], [place]. partial resection, Dr. Rene Paci Buchanan   04/07/2014 Surgery Re-resection at [place], Dr. Derrill Memo   05/10/2014 - 06/21/2014 Radiation Therapy By Dr. Sondra Come with 6000 cGy in 30 fractions to left frontal brain   07/07/2014 -  Chemotherapy Temodar x5 days every 4 weeks for 2 cycles +2 infusions of patient's own lymphocytes.   08/23/2014 Progression MRI showed progression at [facility]   09/27/2014 - 04/18/2015 Chemotherapy Avastin started with irinotecan added on 10/11/2014    12/06/2014 Imaging MRI brain at [facility]- Decreased enhancement, edema, and mass effect. This may represent pseudoresponse related to interval chemotherapy versus true response.   02/07/2015 Imaging [facility] brain at [facility]- Increasing nodular enhancement along the medial aspect of the left frontalresection cavity.   04/04/2015 Progression MRI brain at [facility]- Slight interval increase in nodular enhancement within the genu of thecorpus callosum and increased size of a nodular focus of enhancement medialto the resection cavity.    05/09/2015 - 06/06/2015 Chemotherapy Carboplatin AUC 4 Q 28 days, AVASTIN 10 mg/kg q 2 weeks   06/25/2015 Progression MRI brain at [facility]- Significant interval increase in size of  a peripheral enhancing mass at theposterior aspect of the resection site with elements of diffusionrestriction which may represent hypercellular tumor. Consistent withdisease progression.   06/25/2015 - 08/16/2015 Chemotherapy Etoposide PO 50 mg/m2/day on days 1-21 every 28 days.  Avastin every 2 weeks continued.  (He started Etoposide as dated before he was advised to and he only was taking 50 mg daily).   08/27/2015 Progression    08/29/2015 Treatment Plan Change Referral to [facility], NP from [facility] called explaining that Marquiz has frank progression of disease. She notes that they spent a lot of time discussing Hospice. The conversation ended with Ron and his family considering this option. She notes that if he is adamant about treatment, next line option would be [place], but she admits that she explained to the patient that it has a very low likelihood of benefit and a high likelihood of side effects.  Today, the patient is unable to appropriately formulate a reasonable sentence and saying statements that do not make sense.    He and his niece are interested in Hospice.    We had a long conversation regarding Hospice.  Patient education was given regarding Hospice and the services they provide.  The patient understands that studies report that early enrollment in Hospice actually allows the patient to live longer compared to those who enroll in Hospice nearer to end of life.  Hospice will allow the patient to stay at home at end of life or go to a facility for end of life care.  At this point, the patient would like to remain at home.  Hospice provides the patient with a team of providers to help  with care including physicians, nurses, aides, chaplains, and social workers.  The patient is certainly Hospice appropriate with a life expectancy of less than 6 months.  The patient's niece reports that Cody Buchanan is having a difficult time expressing that he has pain.  He denies pain, but  then grimaces in discomfort.  As a result, she has been managing his pain medication.  She reports that she is providing 2 tablets of Oxy-IR.  She also notes that he is note sleeping.  He lays down and gets up and goes to the kitchen, he gores outside, goes back to bed, etc.  She asks if there is something he can try for insomnia.  Additionally, she notes that he is not using the restroom.  He is going in his paint and on the floor.    Past Medical History  Diagnosis Date  . Glioblastoma   . GERD (gastroesophageal reflux disease)   . Seizures   . Anxiety 09/07/2014    has Malignant brain tumor, left frontal glioblastoma multiforme grade 4; Chronic obstructive pulmonary disease; Seizure disorder; Allergic rhinitis; Acute gouty arthritis; Anxiety; Brain tumor; Altered mental status; Cocaine abuse; and Protein-calorie malnutrition, severe on his problem list.     has No Known Allergies.  Current Outpatient Prescriptions on File Prior to Visit  Medication Sig Dispense Refill  . acetaminophen (TYLENOL) 325 MG tablet Take 650 mg by mouth every 6 (six) hours as needed for mild pain.     Marland Kitchen albuterol (PROVENTIL HFA;VENTOLIN HFA) 108 (90 BASE) MCG/ACT inhaler Inhale 1-2 puffs into the lungs every 6 (six) hours as needed for wheezing or shortness of breath.     . bisacodyl (DULCOLAX) 5 MG EC tablet Take 5 mg by mouth daily as needed for moderate constipation.    Marland Kitchen ibuprofen (ADVIL,MOTRIN) 200 MG tablet Take 400 mg by mouth every 6 (six) hours as needed for mild pain. Pain    . levETIRAcetam (KEPPRA) 1000 MG tablet TAKE 1 TABLET BY MOUTH TWICE DAILY. CONTINUE TAKING MEDICATION UNTIL CLEARED BY DOCTOR. 60 tablet 3  . lidocaine-prilocaine (EMLA) cream Apply a quarter size amount to port site 1 hour prior to chemo. Do not rub in. Cover with plastic wrap. 30 g 3  . loperamide (IMODIUM) 2 MG capsule Take 2 mg by mouth as needed for diarrhea or loose stools. Take 2 tablets after first loose stool and then 1  tablet every 2 hours until you have gone 12 hours without a loose stool. If you develop loose stools during the night, take 2 tablets every 4 hours. Contact the [facility] for uncontrolled loose stools.    . Multiple Vitamins-Minerals (MULTIVITAMIN WITH MINERALS) tablet Take 1 tablet by mouth daily.    . ondansetron (ZOFRAN-ODT) 8 MG disintegrating tablet Take 1 tablet (8 mg total) by mouth every 8 (eight) hours as needed for nausea or vomiting. 45 tablet 1  . ALPRAZolam (XANAX) 1 MG tablet Take 1 tablet (1 mg total) by mouth 3 (three) times daily as needed for anxiety. (Patient not taking: Reported on 08/16/2015) 60 tablet 1  . Bevacizumab (AVASTIN) 100 MG/4ML SOLN Inject into the vein every 14 (fourteen) days.     Marland Kitchen etoposide (VEPESID) 50 MG capsule Take 100 mg (50 mg/m2/day) daily days 1-21 every 28 days. (Patient not taking: Reported on 08/29/2015) 42 capsule 0  . prochlorperazine (COMPAZINE) 10 MG tablet Take 1 tablet (10 mg total) by mouth every 6 (six) hours as needed for nausea or vomiting. (Patient not taking:  Reported on 08/16/2015) 30 tablet 1   Current Facility-Administered Medications on File Prior to Visit  Medication Dose Route Frequency Provider Last Rate Last Dose  . sodium chloride 0.9 % injection 10 mL  10 mL Intravenous PRN Patrici Ranks, MD   10 mL at 01/24/15 1114    Past Surgical History  Procedure Laterality Date  . Salivary gland surgery    . Brain tumor excision    . Portacath placement Right 10/2014    Denies any headaches, dizziness, double vision, fevers, chills, night sweats, nausea, vomiting, diarrhea, constipation, chest pain, heart palpitations, shortness of breath, blood in stool, black tarry stool, urinary pain, urinary burning, urinary frequency, hematuria.   PHYSICAL EXAMINATION  ECOG PERFORMANCE STATUS: 1 - Symptomatic but completely ambulatory  Filed Vitals:   08/29/15 1400  BP: 122/73  Pulse: 78  Temp: 98 F (36.7 C)  Resp: 18     GENERAL:alert, no distress, well nourished, well developed, comfortable, cooperative and smiling, accompanied by his niece, Barnett Applebaum. SKIN: skin color, texture, turgor are normal, no rashes or significant lesions HEAD: Normocephalic, No masses, lesions, tenderness or abnormalities EYES: PERRLA EARS: External ears normal OROPHARYNX:mucous membranes are moist  NECK: snot examined LYMPH:  Not examined BREAST:not examined LUNGS: not examined HEART: not examined ABDOMEN: not examined BACK: Back symmetric, no curvature. EXTREMITIES:less then 2 second capillary refill, no joint deformities, effusion, or inflammation, no skin discoloration, no cyanosis  NEURO: alert, not oriented, difficulty formulating reasonable sentences and statements.   LABORATORY DATA: CBC    Component Value Date/Time   WBC 5.4 08/16/2015 1017   RBC 4.09* 08/16/2015 1017   HGB 12.8* 08/16/2015 1017   HCT 37.4* 08/16/2015 1017   PLT 214 08/16/2015 1017   MCV 91.4 08/16/2015 1017   MCH 31.3 08/16/2015 1017   MCHC 34.2 08/16/2015 1017   RDW 18.1* 08/16/2015 1017   LYMPHSABS 1.2 08/16/2015 1017   MONOABS 0.3 08/16/2015 1017   EOSABS 0.1 08/16/2015 1017   BASOSABS 0.1 08/16/2015 1017      Chemistry      Component Value Date/Time   NA 133* 08/16/2015 1017   K 4.0 08/16/2015 1017   CL 103 08/16/2015 1017   CO2 24 08/16/2015 1017   BUN 20 08/16/2015 1017   CREATININE 0.77 08/16/2015 1017      Component Value Date/Time   CALCIUM 8.6* 08/16/2015 1017   ALKPHOS 42 08/16/2015 1017   AST 17 08/16/2015 1017   ALT 15* 08/16/2015 1017   BILITOT 1.2 08/16/2015 1017        PENDING LABS:   RADIOGRAPHIC STUDIES:  No results found.   PATHOLOGY:    ASSESSMENT AND PLAN:  Malignant brain tumor, left frontal glioblastoma multiforme grade 4 Progression of Disease.  Myriam Jacobson, NP from [facility] called on Monday.  Reported progression of disease and recommending Hospice.  Hospice discussed today.  Patient and  Niece are interested in their services.   Referral to [facility].   Rx for Restoril 30-60 mg at [facility] to help with insomnia.  Rx for Oxy-IR refill.  Return PRN.    THERAPY PLAN:  Referral to hospice.  All questions were answered. The patient knows to call the clinic with any problems, questions or concerns. We can certainly see the patient much sooner if necessary.  Patient and plan discussed with Dr. Ancil Linsey and she is in agreement with the aforementioned.   This note is electronically signed by: Doy Mince 08/29/2015 3:41 PM

## 2015-08-29 NOTE — Progress Notes (Signed)
Referral sent to Hospice at [facility].  Records faxed on 8/31

## 2015-08-30 ENCOUNTER — Inpatient Hospital Stay (HOSPITAL_COMMUNITY): Payer: Medicaid Other

## 2015-09-05 ENCOUNTER — Other Ambulatory Visit (HOSPITAL_COMMUNITY): Payer: Self-pay | Admitting: Oncology

## 2015-09-05 DIAGNOSIS — C719 Malignant neoplasm of brain, unspecified: Secondary | ICD-10-CM

## 2015-09-05 MED ORDER — LORAZEPAM 2 MG PO TABS: 2.0000 mg | ORAL_TABLET | ORAL | Status: AC | PRN

## 2015-09-05 MED ORDER — OXYCODONE HCL 5 MG PO CAPS: 5.0000 mg | ORAL_CAPSULE | ORAL | Status: AC | PRN

## 2015-09-29 DEATH — deceased

## 2015-12-02 ENCOUNTER — Other Ambulatory Visit (HOSPITAL_COMMUNITY): Payer: Self-pay | Admitting: Oncology

## 2015-12-26 ENCOUNTER — Other Ambulatory Visit: Payer: Self-pay | Admitting: Nurse Practitioner

## 2016-06-17 IMAGING — MR MR HEAD WO/W CM
6 of 14 series · 15 of 48 positions shown · IV contrast (multihance)
Comparison: Summary of [facility] brain MRI findings from Oncology
progress note, 06/25/2015, and earlier. [HOSPITAL] brain
MRI 04/19/2014. Head CT without contrast 07/13/2015.

CLINICAL DATA: 59-year-old male with glioblastoma resection in and
[place] and Wednesday February, 2014. Treatment reportedly at [facility]. Reportedly
with recent Disease progression on [facility] brain MRI in [REDACTED]. Altered
mental status, confusion, seizure activity

EXAM:
MRI HEAD WITHOUT AND WITH CONTRAST
TECHNIQUE: Multiplanar, multiecho pulse sequences of the brain and surrounding
structures were obtained without and with intravenous contrast.
CONTRAST:  13mL MULTIHANCE GADOBENATE DIMEGLUMINE 529 MG/ML IV SOLN

[Series 6: T2 · axial · 5.0mm · 0.47mm/px · z∈[-19,+121]mm · 2 of 23 slices shown (1 of 2)]
[im 1/23]
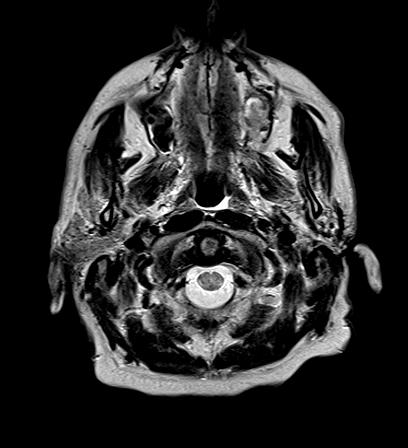
[im 23/23]
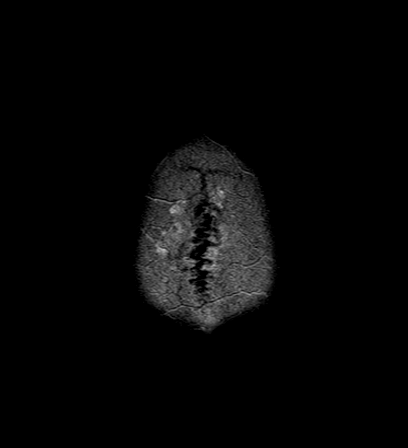

[Series 7: FLAIR · axial · 5.0mm · 0.33mm/px · z∈[-20,+121]mm · 2 of 23 slices shown]
[im 1/23]
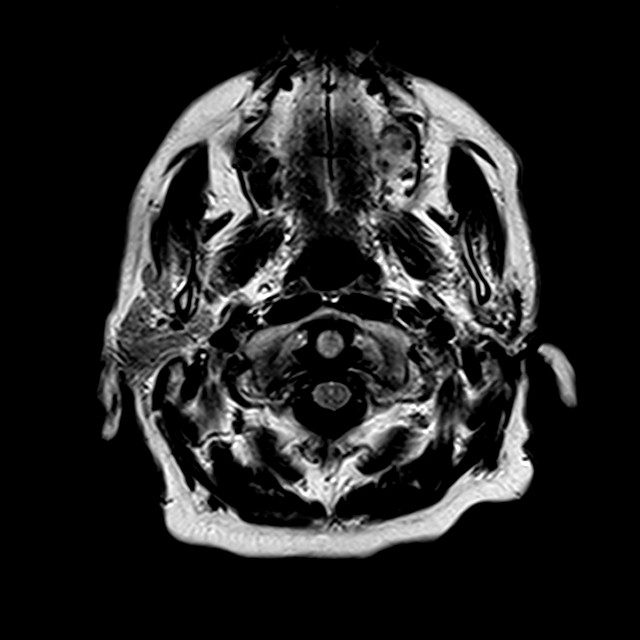
[im 23/23]
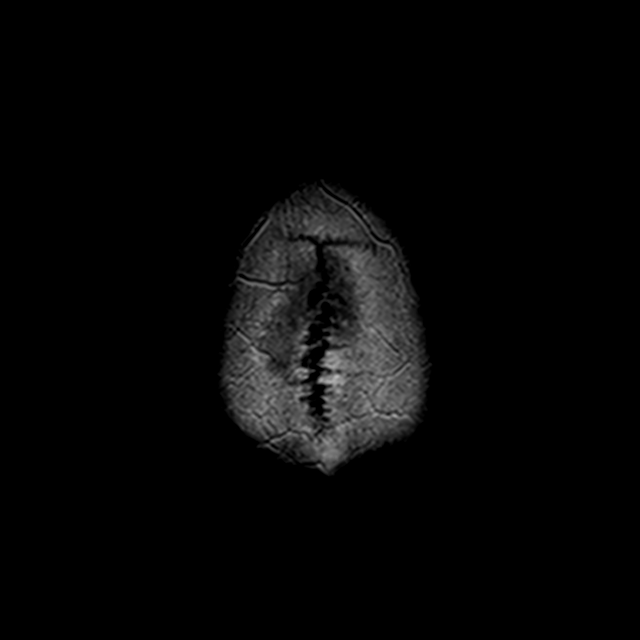

[Series 10: T2 · coronal · 3.0mm · 0.18mm/px · 1 of 36 slices shown (2 of 2)]
[im 1/36]
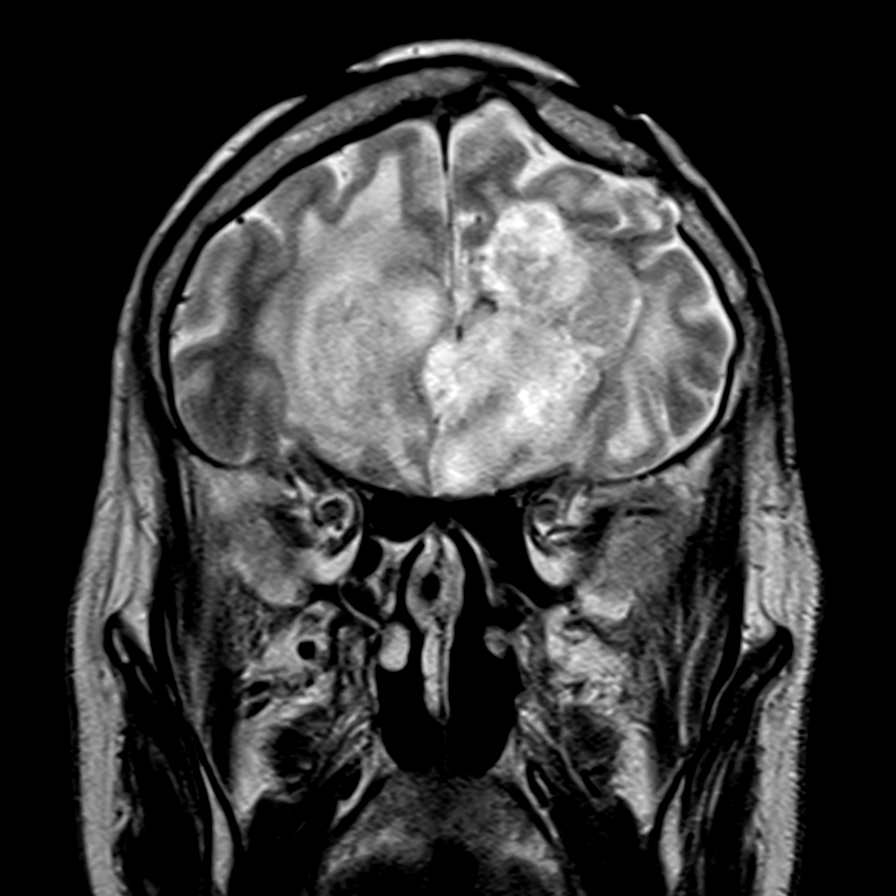

[Series 12: T1 post-contrast · axial · 2.0mm · 0.42mm/px · z∈[-24,+123]mm · 6 of 76 slices shown (1 of 3)]
[im 1/76]
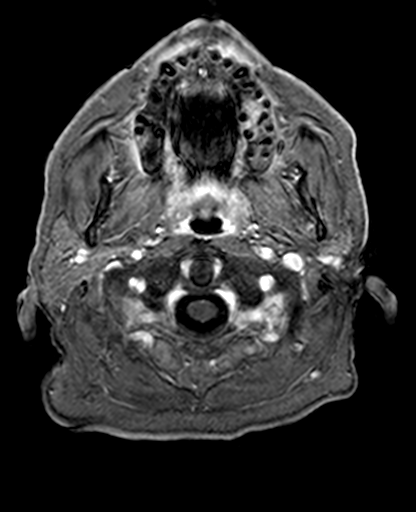
[im 16/76]
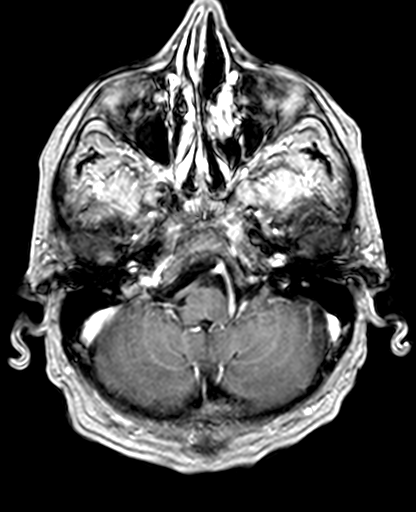
[im 31/76]
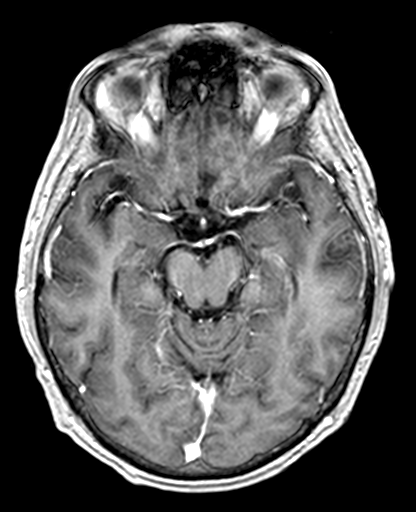
[im 46/76]
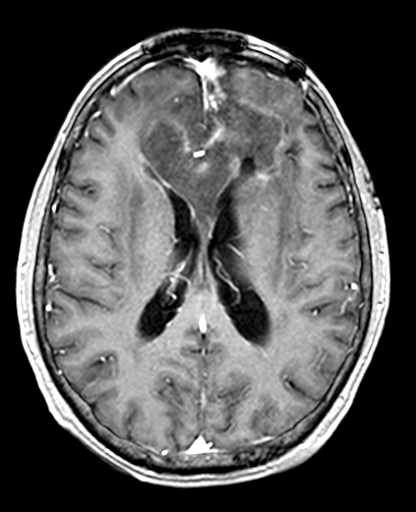
[im 61/76]
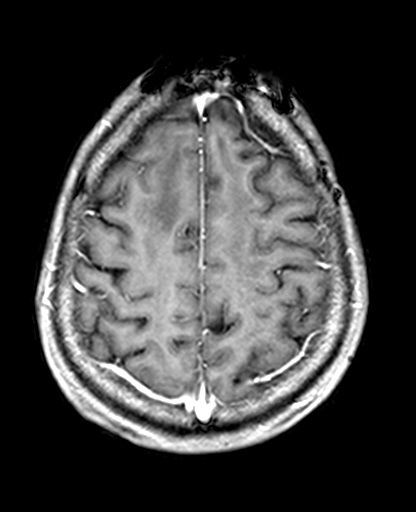
[im 76/76]
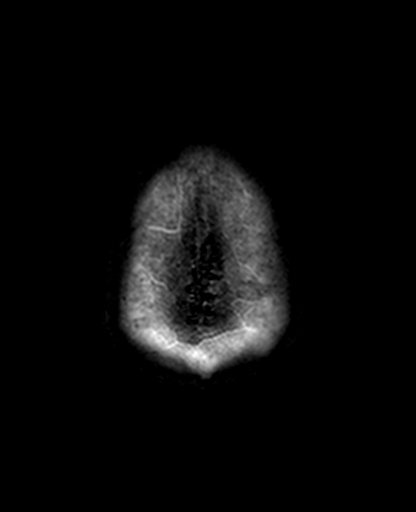

[Series 13: T1 post-contrast · coronal · 5.0mm · 0.34mm/px · 2 of 30 slices shown (2 of 3)]
[im 1/30]
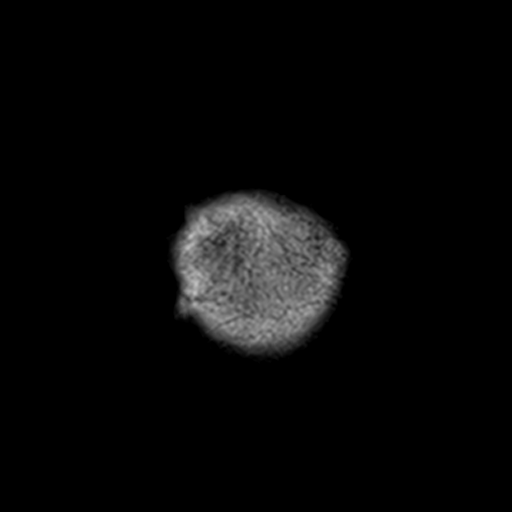
[im 30/30]
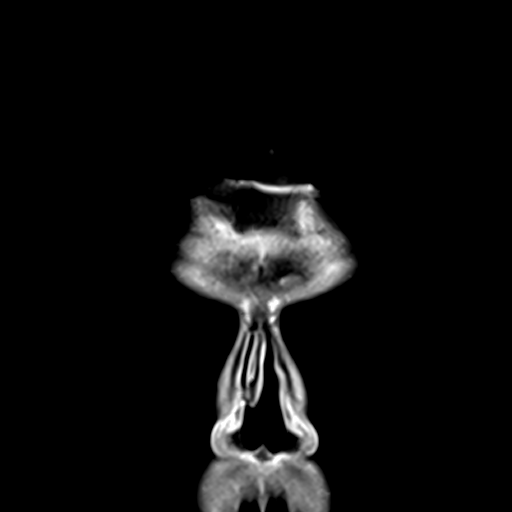

[Series 14: T1 post-contrast · sagittal · 5.0mm · 0.43mm/px · 2 of 21 slices shown (3 of 3)]
[im 1/21]
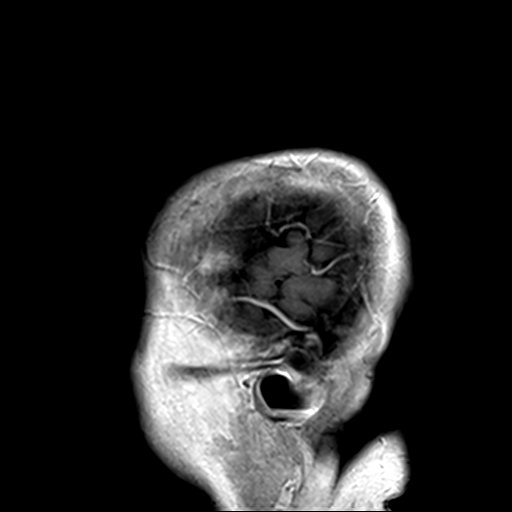
[im 21/21]
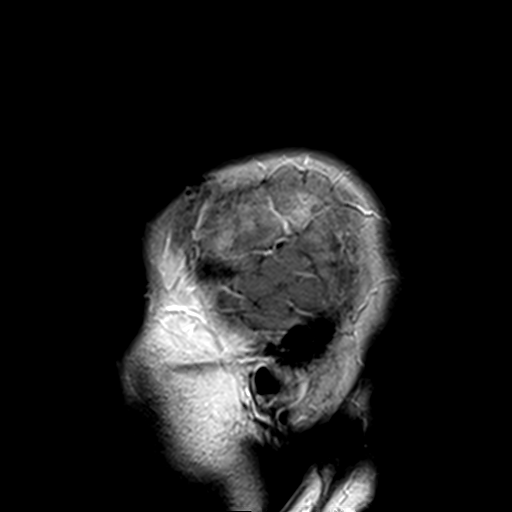

[15 of 48 positions shown; findings below may reference images not displayed]

FINDINGS: Severe progression of disease compared to 04/19/2014. Bulky
"butterfly glioma" type appearance with mass like heterogeneously
increased T2 and FLAIR hyperintensity extending in both anterior
frontal lobes across the corpus callosum. Relatively mild associated
nodular enhancement of the lesion superimposed on a small volume of
intrinsic T1 hyperintensity about the left frontal horn. Confluent
restricted diffusion throughout much of the dominant tumor site in
keeping with hypercellularity. Regional mass effect and confluent
additional bifrontal surrounding T2 and FLAIR hyperintensity in a
vasogenic edema type pattern, but could reflect nonenhancing tumor
in this setting.

Partial effacement of both frontal horns, but no ventriculomegaly.
Small volume hemosiderin in both frontal lobes, more so the left.
Overlying bifrontal craniotomy changes.

No acute intracranial hemorrhage identified. No superimposed
restricted diffusion typical of acute infarct. Outside of the tumor
area, no abnormal intracranial enhancement. Major intracranial
vascular flow voids are stable. Bone marrow signal is normal.

Stable paranasal sinus inflammation. Orbits soft tissues appear
normal. Stable mild left petrous apex fluid. Mastoids are otherwise
clear. Small volume retained secretions in the nasopharynx. No acute
scalp soft tissue findings. No significant extra-axial collection.
Basilar cisterns remain patent. Negative pituitary, cervicomedullary
junction and visualized cervical spine.
IMPRESSION: 1. Severe progression of glioblastoma compared to 04/19/2014. Bulky
"Butterfly Glioma" type appearance now with extensive bifrontal
nonenhancing and enhancing tumor and edema.
2. No ventriculomegaly or impending herniation.
3. No new intracranial abnormality identified.
